# Patient Record
Sex: Female | Born: 1990 | Race: Black or African American | Hispanic: No | Marital: Single | Smoking: Never smoker
Health system: Southern US, Community
[De-identification: ages and names within clinical notes are randomized; demographics above are authoritative.]

## PROBLEM LIST (undated history)

## (undated) DIAGNOSIS — H469 Unspecified optic neuritis: Secondary | ICD-10-CM

## (undated) DIAGNOSIS — G35 Multiple sclerosis: Secondary | ICD-10-CM

## (undated) DIAGNOSIS — R269 Unspecified abnormalities of gait and mobility: Secondary | ICD-10-CM

## (undated) DIAGNOSIS — G43909 Migraine, unspecified, not intractable, without status migrainosus: Secondary | ICD-10-CM

## (undated) HISTORY — PX: APPENDECTOMY: SHX54

## (undated) HISTORY — DX: Unspecified optic neuritis: H46.9

## (undated) HISTORY — DX: Multiple sclerosis: G35

## (undated) HISTORY — DX: Migraine, unspecified, not intractable, without status migrainosus: G43.909

## (undated) HISTORY — DX: Unspecified abnormalities of gait and mobility: R26.9

---
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
## 2009-12-08 ENCOUNTER — Emergency Department (HOSPITAL_COMMUNITY): Admission: EM | Admit: 2009-12-08 | Discharge: 2009-12-08 | Payer: Self-pay | Admitting: Emergency Medicine

## 2009-12-08 IMAGING — CT CT HEAD W/O CM
1 series · 16 of 30 positions shown, 20 images · non-contrast
Comparison: None.

CLINICAL DATA: Dizziness and nausea for 2 days.  Right facial
asymmetry.

CT HEAD WITHOUT CONTRAST
TECHNIQUE: Contiguous axial images were obtained from the base of
the skull through the vertex without contrast.

[Series 2: brain · axial · 0.49mm/px · z∈[+128,+258]mm · 16 of 34 slices shown, 20 images]
[im 2/34  brain]
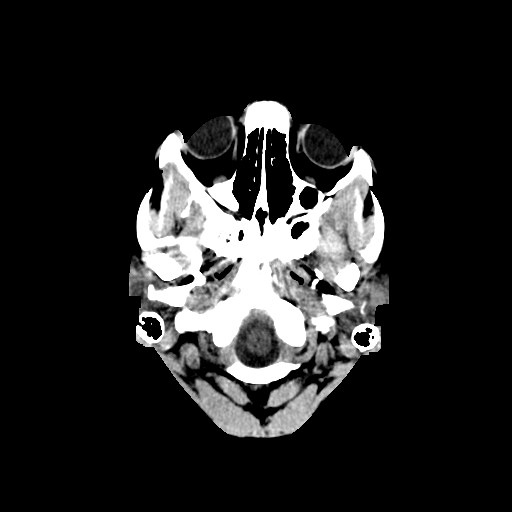
[im 2/34  bone]
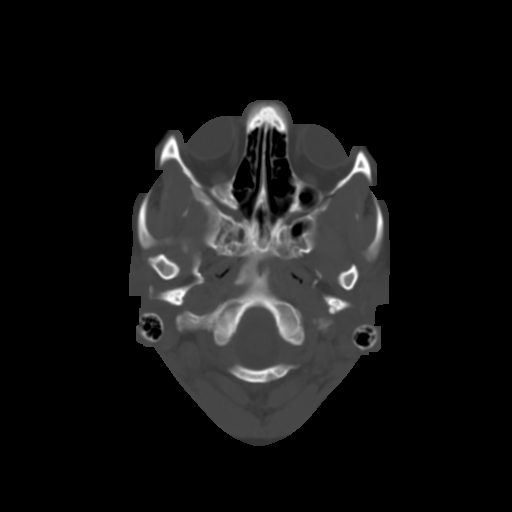
[im 4/34  brain]
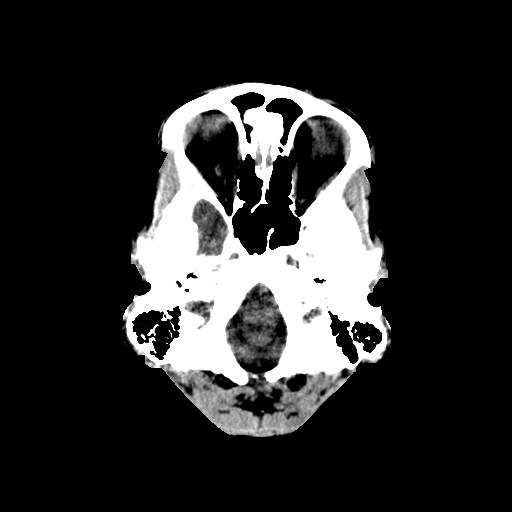
[im 6/34  brain]
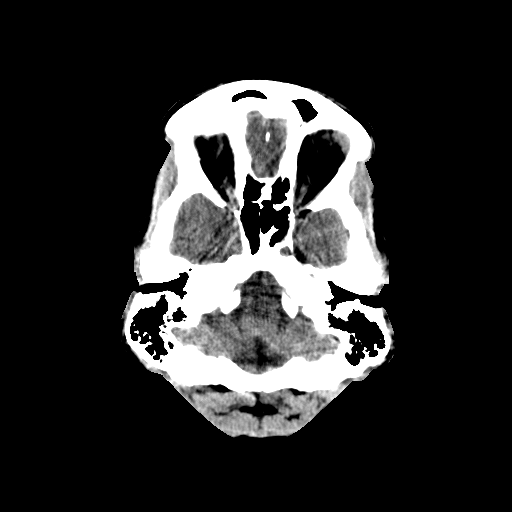
[im 8/34  brain]
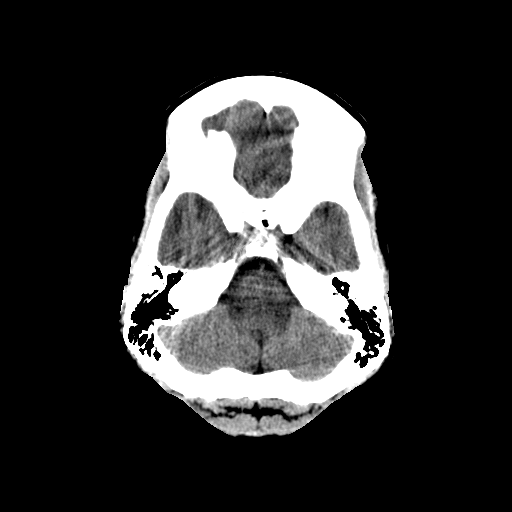
[im 10/34  brain]
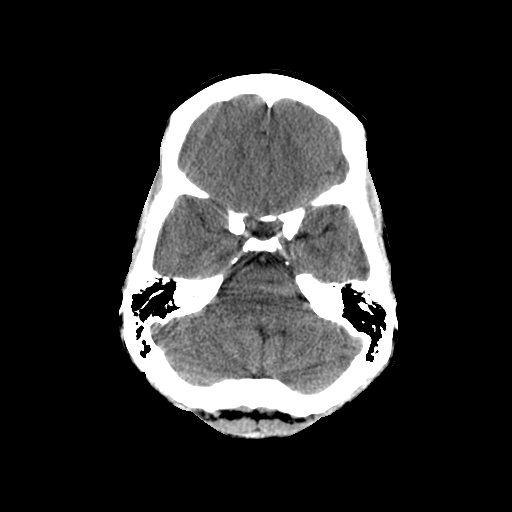
[im 10/34  bone]
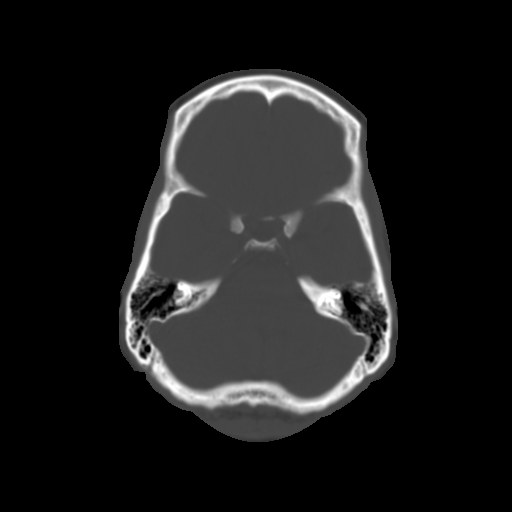
[im 12/34  brain]
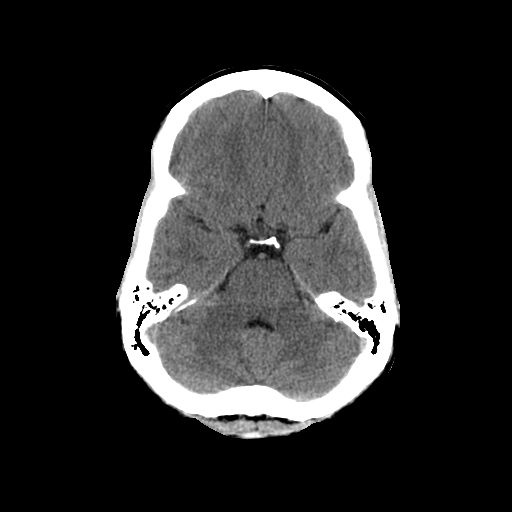
[im 14/34  brain]
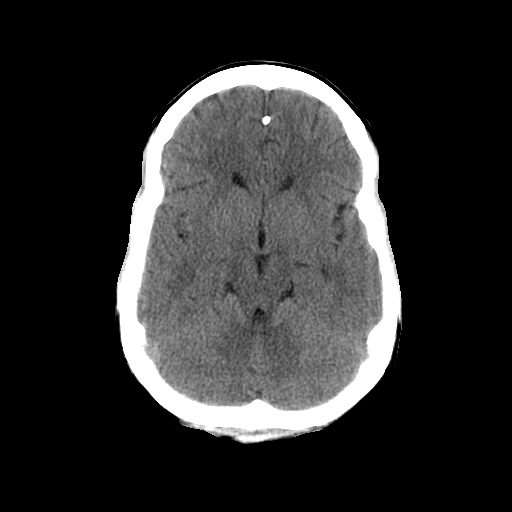
[im 16/34  brain]
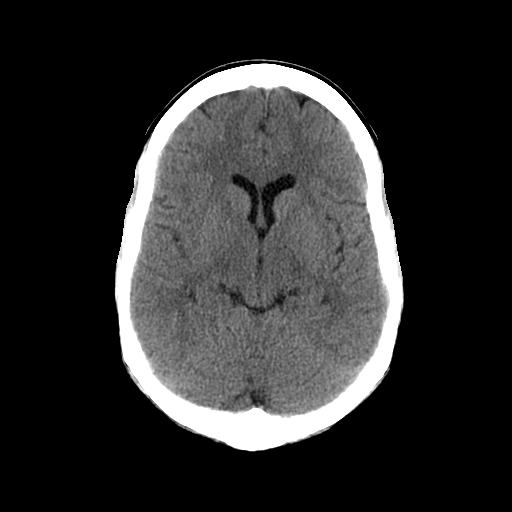
[im 18/34  brain]
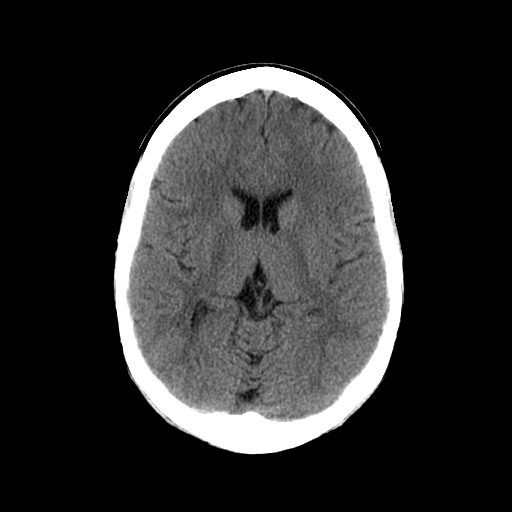
[im 18/34  bone]
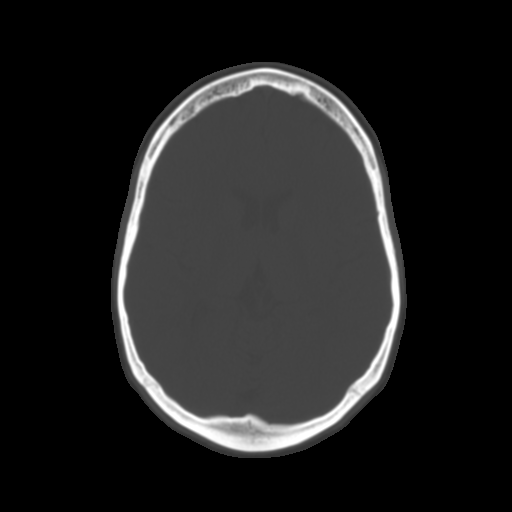
[im 20/34  brain]
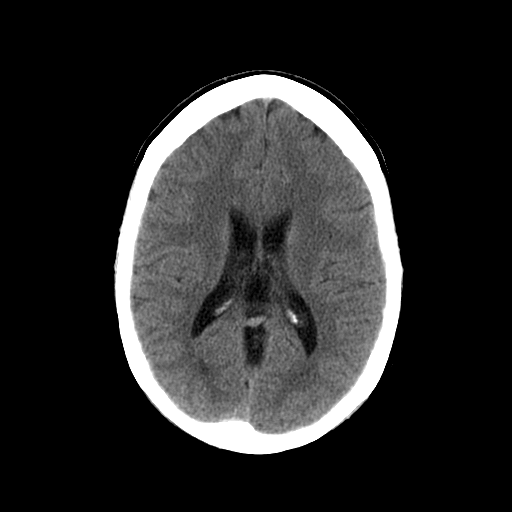
[im 22/34  brain]
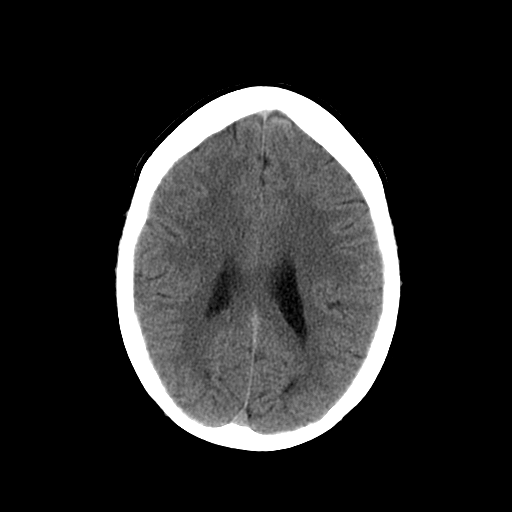
[im 24/34  brain]
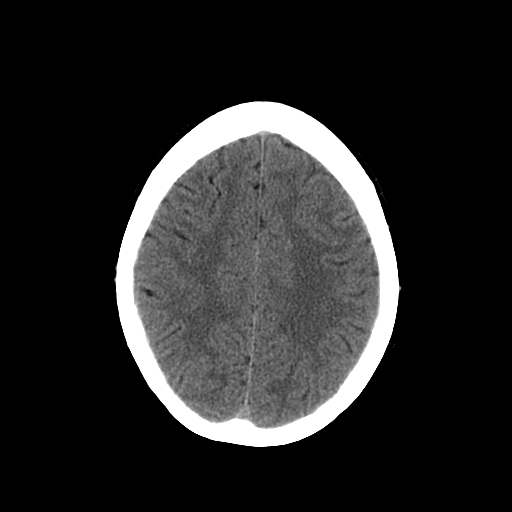
[im 26/34  brain]
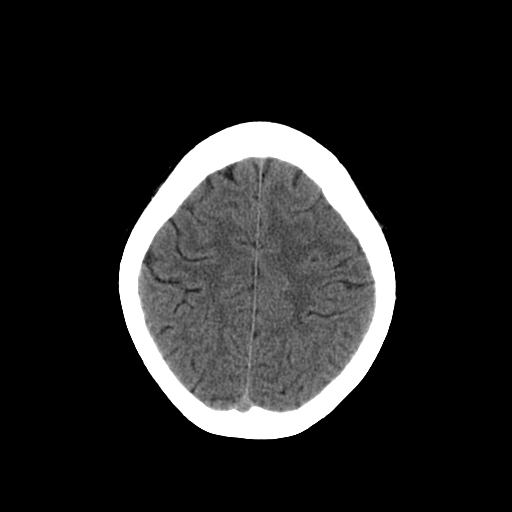
[im 26/34  bone]
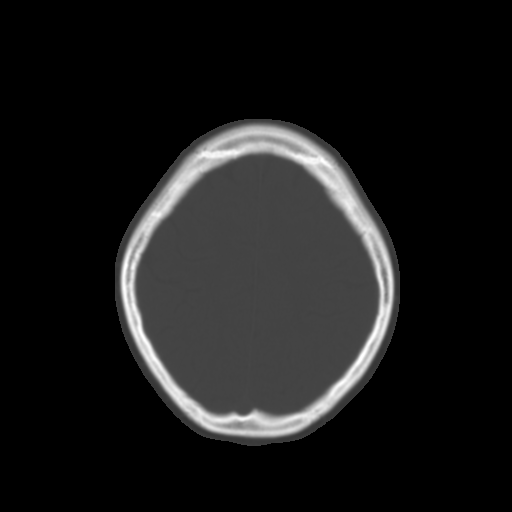
[im 28/34  brain]
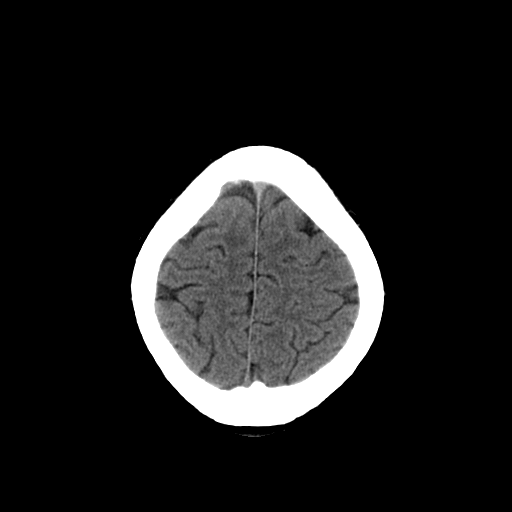
[im 30/34  brain]
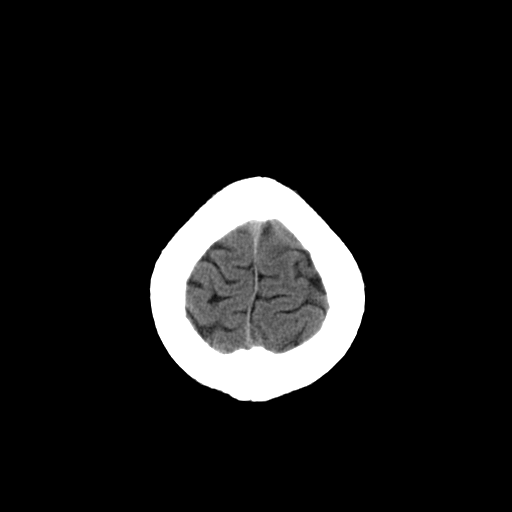
[im 32/34  brain]
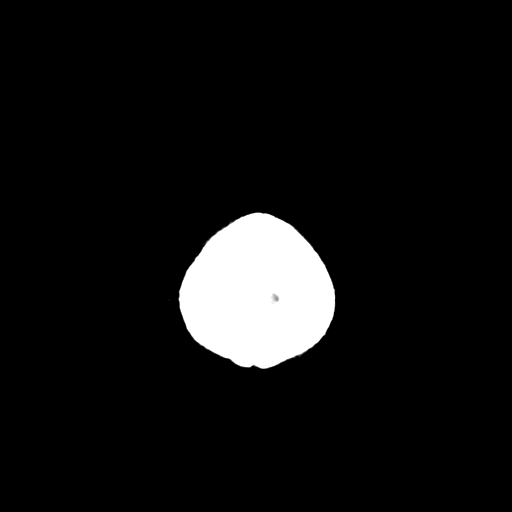

[16 of 30 positions shown; findings below may reference images not displayed]

FINDINGS: There is no evidence for acute infarction, intracranial
hemorrhage, mass lesion, hydrocephalus, or extra-axial fluid. There
is no atrophy or white matter disease.  Calvarium is intact.
Sinuses and mastoids are clear.
IMPRESSION: Negative

## 2012-11-23 ENCOUNTER — Other Ambulatory Visit: Payer: Self-pay | Admitting: *Deleted

## 2012-11-23 DIAGNOSIS — H4612 Retrobulbar neuritis, left eye: Secondary | ICD-10-CM

## 2012-11-24 ENCOUNTER — Ambulatory Visit (INDEPENDENT_AMBULATORY_CARE_PROVIDER_SITE_OTHER): Payer: Managed Care, Other (non HMO) | Admitting: Neurology

## 2012-11-24 ENCOUNTER — Encounter: Payer: Self-pay | Admitting: Neurology

## 2012-11-24 ENCOUNTER — Ambulatory Visit (INDEPENDENT_AMBULATORY_CARE_PROVIDER_SITE_OTHER): Payer: Managed Care, Other (non HMO) | Admitting: *Deleted

## 2012-11-24 VITALS — BP 114/68 | HR 91 | Ht 63.0 in | Wt 136.0 lb

## 2012-11-24 DIAGNOSIS — H469 Unspecified optic neuritis: Secondary | ICD-10-CM

## 2012-11-24 DIAGNOSIS — G35 Multiple sclerosis: Secondary | ICD-10-CM | POA: Insufficient documentation

## 2012-11-24 HISTORY — DX: Multiple sclerosis: G35

## 2012-11-24 HISTORY — DX: Unspecified optic neuritis: H46.9

## 2012-11-24 MED ORDER — SODIUM CHLORIDE 0.9 % IV SOLN: 500.0000 mg | INTRAVENOUS | Status: DC

## 2012-11-24 MED ORDER — METHYLPREDNISOLONE SODIUM SUCC 125 MG IJ SOLR: 500.0000 mg | Freq: Once | INTRAMUSCULAR | Status: DC

## 2012-11-24 NOTE — Patient Instructions (Signed)
Pt to return tomorrow at 0900.

## 2012-11-24 NOTE — Progress Notes (Signed)
Reason for visit: Optic neuritis  Toni Weaver is a 22 y.o. female  History of present illness:  Toni Weaver is a 22 year old right-handed black female with a history of a visual disturbance affecting the left eye approximately 10 days ago. The patient went for a routine eye examination 2 weeks ago, and 3 days after this examination, she began to have some discomfort around the left eye associated with some blurring of vision. The patient has noted that the visual blurring is in the center the vision, not the periphery. The pain in the eye has improved, but visual acuity has gradually worsened over time. The patient has not noted any other symptoms of numbness or weakness of the face, arms, or legs. The patient denies any balance issues or problems controlling the bowels or the bladder. The patient reports no significant fatigue. The patient has undergone MRI evaluation the brain that I have reviewed. It shows multiple white matter lesions with characteristic rounded lesions in the periventricular area consistent with demyelinating disease such as multiple sclerosis.  Past Medical History  Diagnosis Date  . Multiple sclerosis 11/24/2012  . Optic neuritis 11/24/2012    Past Surgical History  Procedure Laterality Date  . Appendectomy      Family History  Problem Relation Age of Onset  . Hypertension Mother   . Hypertension Father   . Cancer Maternal Grandmother     Breast cancer    Social history:  reports that she has never smoked. She does not have any smokeless tobacco history on file. She reports that she does not drink alcohol or use illicit drugs.  Medications:  No current outpatient prescriptions on file prior to visit.   No current facility-administered medications on file prior to visit.    Allergies: No Known Allergies  ROS:  Out of a complete 14 system review of symptoms, the patient complains only of the following symptoms, and all other reviewed systems are  negative.  Visual blurring  Blood pressure 114/68, pulse 91, height 5\' 3"  (1.6 m), weight 136 lb (61.689 kg).  Physical Exam  General: The patient is alert and cooperative at the time of the examination.  Head: Pupils are equal, round, and reactive to light. Discs are flat bilaterally.  Neck: The neck is supple, no carotid bruits are noted.  Respiratory: The respiratory examination is clear.  Cardiovascular: The cardiovascular examination reveals a regular rate and rhythm, no obvious murmurs or rubs are noted.  Skin: Extremities are without significant edema.  Neurologic Exam  Mental status:  Cranial nerves: Facial symmetry is present. There is good sensation of the face to pinprick and soft touch bilaterally. The strength of the facial muscles and the muscles to head turning and shoulder shrug are normal bilaterally. Speech is well enunciated, no aphasia or dysarthria is noted. Extraocular movements are full. Visual fields are full.  Motor: The motor testing reveals 5 over 5 strength of all 4 extremities. Good symmetric motor tone is noted throughout.  Sensory: Sensory testing is intact to pinprick, soft touch, vibration sensation, and position sense on all 4 extremities, with the exception that position sense is depressed slightly in the right foot. No evidence of extinction is noted.  Coordination: Cerebellar testing reveals good finger-nose-finger and heel-to-shin bilaterally.  Gait and station: Gait is normal. Tandem gait is normal. Romberg is negative. No drift is seen.  Reflexes: Deep tendon reflexes are symmetric and normal bilaterally. Toes are downgoing bilaterally.   Assessment/Plan:  1. Optic neuritis,  OS  2. Abnormal MRI, consistent with multiple sclerosis  The patient has multiple brain lesions and evidence of a left optic neuritis. The patient already meets the McDonald criteria for the diagnosis of multiple sclerosis. The patient will go on to have further  blood work at this time, and she will be started on Solu-Medrol taking 500 mg IV daily for 4 days. The patient was given information regarding multiple sclerosis. The patient will be set up for a visual evoked response test, and she will return in 2 weeks to discuss management of her multiple sclerosis. Lumbar puncture in this case is probably not required.  Marlan Palau MD 11/24/2012 3:36 PM  [facility] [address] [place], [place] [address]  Phone [phone] Fax [phone]

## 2012-11-25 ENCOUNTER — Other Ambulatory Visit: Payer: Self-pay | Admitting: Neurology

## 2012-11-25 ENCOUNTER — Ambulatory Visit (INDEPENDENT_AMBULATORY_CARE_PROVIDER_SITE_OTHER): Payer: Managed Care, Other (non HMO) | Admitting: *Deleted

## 2012-11-25 VITALS — BP 108/72 | HR 78 | Temp 98.6°F

## 2012-11-25 DIAGNOSIS — G35 Multiple sclerosis: Secondary | ICD-10-CM

## 2012-11-25 MED ORDER — SODIUM CHLORIDE 0.9 % IV SOLN
500.0000 mg | INTRAVENOUS | Status: AC
  Administered 2012-11-25: 500 mg via INTRAVENOUS

## 2012-11-25 MED ORDER — METHYLPREDNISOLONE SODIUM SUCC 500 MG IJ SOLR: 500.0000 mg | Freq: Once | INTRAMUSCULAR | Status: DC

## 2012-11-25 NOTE — Patient Instructions (Addendum)
Patient to return tomorrow.

## 2012-11-26 ENCOUNTER — Ambulatory Visit: Payer: Self-pay | Admitting: Neurology

## 2012-11-26 ENCOUNTER — Encounter (INDEPENDENT_AMBULATORY_CARE_PROVIDER_SITE_OTHER): Payer: Managed Care, Other (non HMO) | Admitting: *Deleted

## 2012-11-26 ENCOUNTER — Ambulatory Visit: Payer: Self-pay | Admitting: *Deleted

## 2012-11-26 ENCOUNTER — Other Ambulatory Visit (INDEPENDENT_AMBULATORY_CARE_PROVIDER_SITE_OTHER): Payer: Managed Care, Other (non HMO) | Admitting: Neurology

## 2012-11-26 ENCOUNTER — Other Ambulatory Visit: Payer: Self-pay | Admitting: Neurology

## 2012-11-26 DIAGNOSIS — Z0289 Encounter for other administrative examinations: Secondary | ICD-10-CM

## 2012-11-26 DIAGNOSIS — G35 Multiple sclerosis: Secondary | ICD-10-CM

## 2012-11-26 MED ORDER — SODIUM CHLORIDE 0.9 % IV SOLN
500.0000 mg | INTRAVENOUS | Status: DC
  Administered 2012-11-26: 500 mg via INTRAVENOUS

## 2012-11-26 MED ORDER — SODIUM CHLORIDE 0.9 % IV SOLN: 500.0000 mg | INTRAVENOUS | Status: DC

## 2012-11-26 MED ORDER — SODIUM CHLORIDE 0.9 % IV SOLN: 500.0000 mg | Freq: Once | INTRAVENOUS | Status: DC

## 2012-11-26 NOTE — Progress Notes (Unsigned)
This encounter was created in error - please disregard.

## 2012-11-26 NOTE — Progress Notes (Signed)
This encounter was created in error - please disregard.

## 2012-11-26 NOTE — Addendum Note (Signed)
Addended by: Guy Begin on: 11/26/2012 10:19 AM   Modules accepted: Level of Service, SmartSet

## 2012-11-28 LAB — HIV ANTIBODY (ROUTINE TESTING W REFLEX)
HIV 1/O/2 Abs-Index Value: <1 (ref <1.00)
HIV-1/HIV-2 Ab: NONREACTIVE

## 2012-11-28 LAB — LUPUS ANTICOAGULANT
Dilute Viper Venom Time: 36.9 s (ref 0.0–55.1)
PTT Lupus Anticoagulant: 43.9 s (ref 0.0–50.0)
dPT: 41.5 s (ref 0.0–55.0)

## 2012-11-28 LAB — RPR: RPR: NONREACTIVE

## 2012-11-28 LAB — CARDIOLIPIN ANTIBODIES, IGM+IGG: Anticardiolipin IgM: <9 MPL U/mL (ref 0–12)

## 2012-11-29 ENCOUNTER — Telehealth: Payer: Self-pay | Admitting: Neurology

## 2012-11-29 NOTE — Telephone Encounter (Signed)
I called patient. The blood work was unremarkable with exception that the ANA was positive. We may need to get a panel looking at the antibody levels for an, and the patient is considering Gilenya, and we will need to be herpes zoster antibody level. The patient will followup in the next 2 weeks. The visual evoked response test is pending. The MRI scan of the cervical spine is pending.

## 2012-11-30 ENCOUNTER — Telehealth: Payer: Self-pay | Admitting: Neurology

## 2012-11-30 ENCOUNTER — Other Ambulatory Visit: Payer: Self-pay | Admitting: Neurology

## 2012-11-30 ENCOUNTER — Ambulatory Visit (INDEPENDENT_AMBULATORY_CARE_PROVIDER_SITE_OTHER): Payer: Managed Care, Other (non HMO)

## 2012-11-30 DIAGNOSIS — H469 Unspecified optic neuritis: Secondary | ICD-10-CM

## 2012-11-30 DIAGNOSIS — G35 Multiple sclerosis: Secondary | ICD-10-CM

## 2012-11-30 NOTE — Telephone Encounter (Signed)
I called patient. The visual evoked response test was done, confirming presence of a left optic neuritis. The right side was unremarkable.

## 2012-11-30 NOTE — Procedures (Signed)
  VER Study  [place] Toni Weaver is a 22 year old patient with a history of left eye visual disturbance that began around 11/14/2012. The patient has had some discomfort around the eye with eye movement. MRI evaluation the brain has shown multiple white matter lesions consistent with the diagnosis of multiple sclerosis. The patient is being evaluated for the left sided optic neuritis.  Description: Visual evoked response test was performed bilaterally using 32 x 32 check sizes. On the left, the N1 and the P100 waveform latencies were prolonged, these were normal on the right side. The amplitude for the P100 wave forms were within normal limits bilaterally, but the waveform was of lower amplitude on the left.  Impression: This is an abnormal visual evoked response test secondary to prolongation of the P100 waveform latency on the left. This study is consistent with a demyelinating lesion within the anterior visual pathways, and is consistent with a left optic neuritis. The study is normal on the right.

## 2012-12-01 ENCOUNTER — Telehealth: Payer: Self-pay

## 2012-12-01 LAB — NMO IGG AUTOANTIBODIES: NMO-IgG: <1.6 U/mL (ref 0.0–3.0)

## 2012-12-01 NOTE — Telephone Encounter (Signed)
Message copied by Doree Barthel on Wed Dec 01, 2012  8:46 AM ------      Message from: Arther Abbott B      Created: Mon Nov 29, 2012 11:48 AM      Contact: Nurse w/Dr. Corky Mull office       Referral Dr. Corky Mull w/[facility]  needs notes from DOS w/Dr. Anne Hahn on 11/24/12. Needs them fax to [phone]. Thanks  ------

## 2012-12-01 NOTE — Telephone Encounter (Signed)
Sent fax

## 2012-12-02 ENCOUNTER — Other Ambulatory Visit: Payer: Self-pay | Admitting: Diagnostic Neuroimaging

## 2012-12-02 ENCOUNTER — Telehealth: Payer: Self-pay | Admitting: Neurology

## 2012-12-02 DIAGNOSIS — G35 Multiple sclerosis: Secondary | ICD-10-CM

## 2012-12-02 DIAGNOSIS — H469 Unspecified optic neuritis: Secondary | ICD-10-CM

## 2012-12-02 NOTE — Telephone Encounter (Signed)
I called the patient. The MRI of the cervical spine does show findings consistent with multiple sclerosis. The patient should be seen back soon, and we will probably get her started on Gilenya.

## 2012-12-06 ENCOUNTER — Telehealth: Payer: Self-pay | Admitting: *Deleted

## 2012-12-06 ENCOUNTER — Other Ambulatory Visit: Payer: Self-pay | Admitting: Neurology

## 2012-12-06 DIAGNOSIS — G35 Multiple sclerosis: Secondary | ICD-10-CM

## 2012-12-06 NOTE — Telephone Encounter (Signed)
I called and left a message for the patient's mother to callback to the office to speak with me Jasmine December) concerning an appt,

## 2012-12-06 NOTE — Telephone Encounter (Signed)
Message copied by Janey Greaser on Mon Dec 06, 2012  9:35 AM ------      Message from: [facility], MONICA L      Created: Thu Dec 02, 2012 10:19 AM      Contact: Margaret(mother)       Pt's mom spoke with Dr. Anne Hahn on Monday and came in for lab work and eye exam, and pt is ready to get an appt for treatment for Gilenya.  Please get the paperwork for assistance cost together so she can pick it up to have it filled out. Mother states Per Dr. Stormy Card will need to work the pt in ASAP. [phone] Claris Che (mother) this is her call back number and you can leave her a detailed message if she does not answer. ------

## 2012-12-08 ENCOUNTER — Other Ambulatory Visit: Payer: Self-pay | Admitting: Neurology

## 2012-12-08 DIAGNOSIS — Z5181 Encounter for therapeutic drug level monitoring: Secondary | ICD-10-CM

## 2012-12-09 ENCOUNTER — Telehealth: Payer: Self-pay | Admitting: Neurology

## 2012-12-09 LAB — CBC WITH DIFFERENTIAL
Basos: 1 % (ref 0–3)
Eosinophils Absolute: 0 10*3/uL (ref 0.0–0.4)
Hemoglobin: 11.9 g/dL (ref 11.1–15.9)
Immature Grans (Abs): 0 10*3/uL (ref 0.0–0.1)
Lymphs: 25 % (ref 14–46)
MCHC: 33.7 g/dL (ref 31.5–35.7)
Monocytes: 9 % (ref 4–12)
Neutrophils Relative %: 64 % (ref 40–74)
Platelets: 380 10*3/uL — ABNORMAL HIGH (ref 155–379)
RDW: 14.8 % (ref 12.3–15.4)
WBC: 7.9 10*3/uL (ref 3.4–10.8)

## 2012-12-09 LAB — COMPREHENSIVE METABOLIC PANEL
Alkaline Phosphatase: 60 IU/L (ref 39–117)
CO2: 28 mmol/L (ref 19–28)
Chloride: 102 mmol/L (ref 97–108)
GFR calc non Af Amer: 125 mL/min/{1.73_m2} (ref >59)
Globulin, Total: 2.3 g/dL (ref 1.5–4.5)
Glucose: 70 mg/dL (ref 65–99)
Potassium: 4.4 mmol/L (ref 3.5–5.2)
Total Protein: 6.8 g/dL (ref 6.0–8.5)

## 2012-12-09 LAB — VARICELLA ZOSTER ANTIBODY, IGG: Varicella zoster IgG: 1909 index (ref >165)

## 2012-12-09 NOTE — Progress Notes (Signed)
I called and also spoke to mother of pt.   Pt alittle overwhelmed with all the information.   Ok to go thru mother, to relay information.   Mother is on the contact emergency list.  I told mother that the [place] p/w faxed and insurance verification for the gilenya in process thru go start program.  She has appt with cardiology on 12-31-12, eye and labs, including chicken pox done.  Once all the tests/consults/ medication auth done will proceed with FDO at Dr. Verl Dicker office.  Mother verbalized understanding.

## 2012-12-09 NOTE — Progress Notes (Signed)
Quick Note:  I called and spoke with the patient concerning her lab results being normal. ______ 

## 2012-12-24 NOTE — Telephone Encounter (Signed)
done

## 2013-01-07 ENCOUNTER — Telehealth: Payer: Self-pay

## 2013-01-07 NOTE — Telephone Encounter (Signed)
[employer] sent [place] a fax denying the request for coverage on Gilenya.  They state the patient has to have a documented trial and failure of Avonex or Rebif.  Please advise. Thank you.

## 2013-01-07 NOTE — Telephone Encounter (Signed)
I called the patient. Her insurance will not allow Gilenya for her MS. We will need to start with Avonex or Rebif.She will need to come in to sign a form to get started.

## 2013-01-10 ENCOUNTER — Telehealth: Payer: Self-pay | Admitting: Neurology

## 2013-01-10 NOTE — Telephone Encounter (Signed)
Enrollement form has been completed.  The patient will need to sign, as well as the provider.  I called the patient's mother back.  Got no answer.  Left message.

## 2013-02-24 ENCOUNTER — Telehealth: Payer: Self-pay | Admitting: Neurology

## 2013-02-24 DIAGNOSIS — R3911 Hesitancy of micturition: Secondary | ICD-10-CM

## 2013-02-24 NOTE — Telephone Encounter (Signed)
I called patient. The patient has had some difficulty voiding the bladder over the last 7-10 days. The patient will come in to get a urinalysis to ensure that she does not have a urinary tract infection. The patient is on Gilenya, and she is concerned that this may be a medication side effect. We'll need to rule out a urinary tract infection first.

## 2013-02-24 NOTE — Telephone Encounter (Signed)
I called patient. I left messages. I will call back tomorrow.

## 2013-02-24 NOTE — Telephone Encounter (Signed)
Patient called stating she is concern about the side effect of the medication.  I tried calling patient to verify medication but I had to leave a message.Reading phone notes I think it Rebif.

## 2013-02-28 ENCOUNTER — Other Ambulatory Visit: Payer: Self-pay | Admitting: *Deleted

## 2013-02-28 ENCOUNTER — Other Ambulatory Visit: Payer: Self-pay | Admitting: Neurology

## 2013-02-28 DIAGNOSIS — R3911 Hesitancy of micturition: Secondary | ICD-10-CM

## 2013-03-01 ENCOUNTER — Telehealth: Payer: Self-pay | Admitting: Neurology

## 2013-03-01 LAB — URINALYSIS, ROUTINE W REFLEX MICROSCOPIC
Bilirubin, UA: NEGATIVE
Glucose, UA: NEGATIVE
Ketones, UA: NEGATIVE
Leukocytes, UA: NEGATIVE
Nitrite, UA: NEGATIVE
RBC, UA: NEGATIVE
Specific Gravity, UA: <=1.005 — AB (ref 1.005–1.030)
Urobilinogen, Ur: 0.2 mg/dL (ref 0.0–1.9)
pH, UA: 7 (ref 5.0–7.5)

## 2013-03-01 NOTE — Telephone Encounter (Signed)
I called patient. The urinalysis was unremarkable. The patient indicates that her bladder issues are intermittent. We will watch for now. The patient is not on Gilenya, as her insurance company would not cover this. The patient is on Rebif.

## 2013-03-08 ENCOUNTER — Other Ambulatory Visit: Payer: Self-pay | Admitting: *Deleted

## 2013-03-08 DIAGNOSIS — Z5181 Encounter for therapeutic drug level monitoring: Secondary | ICD-10-CM

## 2013-04-11 ENCOUNTER — Telehealth: Payer: Self-pay | Admitting: Neurology

## 2013-04-11 NOTE — Telephone Encounter (Signed)
Called patient to get a little more details on the which injection and what type of headache she is having. Told her to call office back once she got the message.

## 2013-04-12 NOTE — Telephone Encounter (Signed)
Tried to reach patient again. No answer.

## 2013-04-13 ENCOUNTER — Telehealth: Payer: Self-pay | Admitting: *Deleted

## 2013-04-13 MED ORDER — NORTRIPTYLINE HCL 10 MG PO CAPS: ORAL_CAPSULE | ORAL | Status: DC

## 2013-04-13 NOTE — Telephone Encounter (Signed)
I called patient. She is having daily headaches over the last several months after she went to the full dose of the Rebif. I will try nortriptyline, but if this does not help, we will need to consider getting the patient off of Rebif and going to Copaxone.

## 2013-04-14 ENCOUNTER — Telehealth: Payer: Self-pay | Admitting: Nurse Practitioner

## 2013-04-14 NOTE — Telephone Encounter (Signed)
Call pt no answer, left message. °

## 2013-04-15 ENCOUNTER — Ambulatory Visit (INDEPENDENT_AMBULATORY_CARE_PROVIDER_SITE_OTHER): Payer: Managed Care, Other (non HMO) | Admitting: Nurse Practitioner

## 2013-04-15 ENCOUNTER — Encounter: Payer: Self-pay | Admitting: Nurse Practitioner

## 2013-04-15 VITALS — BP 118/76 | HR 76 | Temp 97.8°F | Ht 63.5 in | Wt 142.0 lb

## 2013-04-15 DIAGNOSIS — G35 Multiple sclerosis: Secondary | ICD-10-CM

## 2013-04-15 DIAGNOSIS — R51 Headache: Secondary | ICD-10-CM

## 2013-04-15 NOTE — Progress Notes (Addendum)
[facility]  PATIENT: Toni Weaver DOB: 10-31-1990   REASON FOR VISIT: follow up HISTORY FROM: patient  HISTORY OF PRESENT ILLNESS: 11/24/12 Toni Weaver):  Ms. Pinho is a 22 year old right-handed black female with a history of a visual disturbance affecting the left eye approximately 10 days ago. The patient went for a routine eye examination 2 weeks ago, and 3 days after this examination, she began to have some discomfort around the left eye associated with some blurring of vision. The patient has noted that the visual blurring is in the center the vision, not the periphery. The pain in the eye has improved, but visual acuity has gradually worsened over time. The patient has not noted any other symptoms of numbness or weakness of the face, arms, or legs. The patient denies any balance issues or problems controlling the bowels or the bladder. The patient reports no significant fatigue. The patient has undergone MRI evaluation the brain that I have reviewed. It shows multiple white matter lesions with characteristic rounded lesions in the periventricular area consistent with demyelinating disease such as multiple sclerosis.  04/13/13 (KW): Phone note:  She is having daily headaches over the last several months after she went to the full dose of the Rebif. I will try nortriptyline, but if this does not help, we will need to consider getting the patient off of Rebif and going to Copaxone.  UPDATE 04/15/13 (LL):  Patient comes in for follow up and labs.  She is still having persistent daily headaches, worse the day after injections.  She did not fill the Nortriptyline Rx because she states she is not depressed and doesn't like to take medications if not needed.  She takes Tylenol prn which helps, she states.  Denies any problems with injections or injection site reactions.  She was seen by [facility] RN, Raynelle Fanning, Wednesday.  No new neurologic symptoms.  REVIEW OF SYSTEMS: Full 14  system review of systems performed and notable only for:  Constitutional: N/A  Cardiovascular: N/A  Ear/Nose/Throat: N/A  Skin: N/A  Eyes: N/A  Respiratory: N/A  Gastroitestinal: N/A  Genitourinary: N/A Hematology/Lymphatic: N/A  Endocrine: N/A Musculoskeletal:N/A  Allergy/Immunology: N/A  Neurological: headache Psychiatric: N/A Sleep: N/A   ALLERGIES: No Known Allergies  HOME MEDICATIONS: Outpatient Prescriptions Prior to Visit  Medication Sig Dispense Refill  . interferon beta-1a (REBIF) 44 MCG/0.5ML injection Inject 44 mcg into the skin 3 (three) times a week.      . Multiple Vitamin (MULTIVITAMIN) tablet Take 1 tablet by mouth daily.        PAST MEDICAL HISTORY: Past Medical History  Diagnosis Date  . Multiple sclerosis 11/24/2012  . Optic neuritis 11/24/2012    PAST SURGICAL HISTORY: Past Surgical History  Procedure Laterality Date  . Appendectomy      FAMILY HISTORY: Family History  Problem Relation Age of Onset  . Hypertension Mother   . Hypertension Father   . Cancer Maternal Grandmother     Breast cancer    SOCIAL HISTORY: History   Social History  . Marital Status: Single    Spouse Name: N/A    Number of Children: 0  . Years of Education: In college   Occupational History  .  Other    Pt. works at [employer]   Social History Main Topics  . Smoking status: Never Smoker   . Smokeless tobacco: Not on file  . Alcohol Use: No  . Drug Use: No  . Sexual Activity: Not on  file   Other Topics Concern  . Not on file   Social History Narrative  . No narrative on file   PHYSICAL EXAM  Filed Vitals:   04/15/13 0908  BP: 118/76  Pulse: 76  Temp: 97.8 F (36.6 C)  TempSrc: Oral  Height: 5' 3.5" (1.613 m)  Weight: 142 lb (64.411 kg)   Body mass index is 24.76 kg/(m^2).  Physical Exam  Generalized: Well developed young AA female, in no acute distress  Head: normocephalic and atraumatic. Oropharynx benign  Neck: Supple,  no carotid bruits  Cardiac: Regular rate rhythm, no murmur  Respiratory: The respiratory examination is clear.  Musculoskeletal: No deformity  Skin: Extremities are without significant edema.   Neurologic Exam  Mental status:  Cranial nerves: Facial symmetry is present. There is good sensation of the face to pinprick and soft touch bilaterally. The strength of the facial muscles and the muscles to head turning and shoulder shrug are normal bilaterally. Speech is well enunciated, no aphasia or dysarthria is noted. Extraocular movements are full. Visual fields are full.  Motor: The motor testing reveals 5 over 5 strength of all 4 extremities. Good symmetric motor tone is noted throughout.  Sensory: Sensory testing is intact to pinprick, soft touch, vibration sensation, and position sense on all 4 extremities, with the exception that position sense is depressed slightly in the right foot. No evidence of extinction is noted.  Coordination: Cerebellar testing reveals good finger-nose-finger and heel-to-shin bilaterally.  Gait and station: Gait is normal. Tandem gait is normal. Romberg is negative. No drift is seen.  Reflexes: Deep tendon reflexes are symmetric and normal bilaterally. Toes are downgoing bilaterally.   DIAGNOSTIC DATA (LABS, IMAGING, TESTING) - I reviewed patient records, labs, notes, testing and imaging myself where available.  Lab Results  Component Value Date   WBC 7.9 12/08/2012   HGB 11.9 12/08/2012   HCT 35.3 12/08/2012   MCV 81 12/08/2012   PLT 380* 12/08/2012      Component Value Date/Time   NA 141 12/08/2012 1151   K 4.4 12/08/2012 1151   CL 102 12/08/2012 1151   CO2 28 12/08/2012 1151   GLUCOSE 70 12/08/2012 1151   BUN 9 12/08/2012 1151   CREATININE 0.68 12/08/2012 1151   CALCIUM 9.8 12/08/2012 1151   PROT 6.8 12/08/2012 1151   AST 15 12/08/2012 1151   ALT 8 12/08/2012 1151   ALKPHOS 60 12/08/2012 1151   BILITOT 0.4 12/08/2012 1151   GFRNONAA 125 12/08/2012 1151   GFRAA 144 12/08/2012 1151    No results found for this basename: CHOL,  HDL,  LDLCALC,  LDLDIRECT,  TRIG,  CHOLHDL   No results found for this basename: HGBA1C   No results found for this basename: VITAMINB12   No results found for this basename: TSH    VER Study 11/30/12 This is an abnormal visual evoked response test secondary to prolongation of the P100 waveform latency on the left. This study is consistent with a demyelinating lesion within the anterior visual pathways, and is consistent with a left optic neuritis. The study is normal on the right.   ASSESSMENT AND PLAN 22 y.o. year old female  has a past medical history of Multiple sclerosis (11/24/2012) and Optic neuritis (11/24/2012) here for follow up of MS.  She started on Rebif injections in July.  She has been doing well with injections, but having persistent headaches.  Nortriptyline was ordered but patient chose not to have medication filled because it was  an antidepressant.  Even though it was explained that Nortriptyline was used for many other diagnoses, including Migraine and tension headaches, she did not want to try it.  She would rather use Tylenol prn.  PLAN: 1. Continue Rebif. 2. Check labs today: cbc with diff, LFTs and TSH. 3. Follow up visit in 6 months, sooner as needed.   Orders Placed This Encounter  Procedures  . Hepatic function panel  . TSH  . CBC with Differential   Tawny Asal Lyden Redner, MSN, NP-C 04/15/2013, 10:01 AM [facility] [address] [place], [place] [address] [phone]  The patient was seen by myself today, and examined. The patient has been having headaches on the Rebif, but she does not wish to go on nortriptyline for the headache. The patient will continue Rebif for now. No new MS symptoms have been noted.  I have read the note, and I agree with the clinical assessment and plan.  Lesly Dukes

## 2013-04-15 NOTE — Patient Instructions (Addendum)
Lab work today, CBC, liver function and Thyroid function.  Follow up visit in 6 months, call sooner if you have any problems.

## 2013-04-15 NOTE — Telephone Encounter (Signed)
PER JULIE, PATIENT DUE FOR SOME LABS. APPT SCHEDULED FOR 04/15/13 WITH L.LAM

## 2013-04-16 LAB — CBC WITH DIFFERENTIAL/PLATELET
Basophils Absolute: 0 10*3/uL (ref 0.0–0.2)
Basos: 1 % (ref 0–3)
Eos: 1 % (ref 0–5)
Eosinophils Absolute: 0 10*3/uL (ref 0.0–0.4)
HCT: 35.5 % (ref 34.0–46.6)
Hemoglobin: 11.9 g/dL (ref 11.1–15.9)
Immature Grans (Abs): 0 10*3/uL (ref 0.0–0.1)
Immature Granulocytes: 0 % (ref 0–2)
Lymphocytes Absolute: 2.2 10*3/uL (ref 0.7–3.1)
Lymphs: 52 % — ABNORMAL HIGH (ref 14–46)
MCH: 26.4 pg — ABNORMAL LOW (ref 26.6–33.0)
MCHC: 33.5 g/dL (ref 31.5–35.7)
MCV: 79 fL (ref 79–97)
Monocytes Absolute: 0.4 10*3/uL (ref 0.1–0.9)
Monocytes: 9 % (ref 4–12)
Neutrophils Absolute: 1.6 10*3/uL (ref 1.4–7.0)
Neutrophils Relative %: 37 % — ABNORMAL LOW (ref 40–74)
RBC: 4.51 x10E6/uL (ref 3.77–5.28)
RDW: 14.1 % (ref 12.3–15.4)
WBC: 4.3 10*3/uL (ref 3.4–10.8)

## 2013-04-16 LAB — HEPATIC FUNCTION PANEL
ALT: 58 IU/L — ABNORMAL HIGH (ref 0–32)
AST: 36 IU/L (ref 0–40)
Albumin: 4.4 g/dL (ref 3.5–5.5)
Alkaline Phosphatase: 59 IU/L (ref 39–117)
Bilirubin, Direct: 0.08 mg/dL (ref 0.00–0.40)
Total Bilirubin: 0.3 mg/dL (ref 0.0–1.2)
Total Protein: 6.8 g/dL (ref 6.0–8.5)

## 2013-04-16 LAB — TSH: TSH: 1.71 u[IU]/mL (ref 0.450–4.500)

## 2013-04-26 ENCOUNTER — Other Ambulatory Visit: Payer: Self-pay | Admitting: Nurse Practitioner

## 2013-04-26 DIAGNOSIS — G35 Multiple sclerosis: Secondary | ICD-10-CM

## 2013-04-28 ENCOUNTER — Telehealth: Payer: Self-pay | Admitting: Neurology

## 2013-04-28 ENCOUNTER — Telehealth: Payer: Self-pay

## 2013-04-28 NOTE — Telephone Encounter (Signed)
Message copied by [facility] on Thu Apr 28, 2013  1:53 PM ------      Message from: LAM, Larita Fife E      Created: Tue Apr 26, 2013  1:52 PM       Liver enzymes are slightly elevated.  Please call patient or send letter and ask that she come back in 3 months to have repeat liver enzymes. ------

## 2013-04-28 NOTE — Telephone Encounter (Signed)
I called patient's cell phone and left VM that liver enzymes were slightly elevated. Not of concern but, we would like patient to come back in about 2.5 to 3 months to have that rechecked. Please call and schedule follow up lab appointment.

## 2013-04-29 NOTE — Telephone Encounter (Signed)
I called the mother. The SGPT was slightly elevated, we will follow this over time. I would not readjust the dosing of the medication at this time.

## 2013-04-29 NOTE — Telephone Encounter (Signed)
Dr. Anne Hahn the patient mother is concerned that her daughter liver enzymes are elevated. Her father has had a history of liver problems. She wants to know if the Rebif can be decreased until liver enzymes are retested. Please Call.

## 2013-06-23 ENCOUNTER — Telehealth: Payer: Self-pay | Admitting: Neurology

## 2013-06-23 NOTE — Telephone Encounter (Signed)
Called for more information, lt VM

## 2013-08-24 ENCOUNTER — Other Ambulatory Visit: Payer: Self-pay

## 2013-08-24 MED ORDER — INTERFERON BETA-1A 44 MCG/0.5ML ~~LOC~~ SOLN: 44.0000 ug | SUBCUTANEOUS | Status: DC

## 2013-10-25 ENCOUNTER — Telehealth: Payer: Self-pay | Admitting: *Deleted

## 2013-10-25 ENCOUNTER — Telehealth: Payer: Self-pay | Admitting: Neurology

## 2013-10-25 NOTE — Telephone Encounter (Signed)
Dr. Anne Hahn needs to change patient appt from 9 am 10/28/13. 1st available if she call back in time 8 am 3/25.

## 2013-10-25 NOTE — Telephone Encounter (Signed)
Pt called back to state she could make the appointment at 8:00 tomorrow.

## 2013-10-25 NOTE — Telephone Encounter (Signed)
Pt returned Sonya's call.  She stated that she could not make the 8:00 appointment on 3/25. Please call to discuss if there are any other options.  Thank you

## 2013-10-26 ENCOUNTER — Ambulatory Visit (INDEPENDENT_AMBULATORY_CARE_PROVIDER_SITE_OTHER): Payer: Managed Care, Other (non HMO) | Admitting: Neurology

## 2013-10-26 ENCOUNTER — Encounter: Payer: Self-pay | Admitting: Neurology

## 2013-10-26 ENCOUNTER — Encounter (INDEPENDENT_AMBULATORY_CARE_PROVIDER_SITE_OTHER): Payer: Self-pay

## 2013-10-26 VITALS — BP 116/78 | HR 70 | Wt 148.0 lb

## 2013-10-26 DIAGNOSIS — G35 Multiple sclerosis: Secondary | ICD-10-CM

## 2013-10-26 DIAGNOSIS — H469 Unspecified optic neuritis: Secondary | ICD-10-CM

## 2013-10-26 DIAGNOSIS — Z5181 Encounter for therapeutic drug level monitoring: Secondary | ICD-10-CM

## 2013-10-26 LAB — CBC WITH DIFFERENTIAL
Basophils Absolute: 0 10*3/uL (ref 0.0–0.2)
Basos: 1 %
EOS: 2 %
Eosinophils Absolute: 0.1 10*3/uL (ref 0.0–0.4)
HEMATOCRIT: 36.3 % (ref 34.0–46.6)
Hemoglobin: 12.7 g/dL (ref 11.1–15.9)
Lymphocytes Absolute: 1.6 10*3/uL (ref 0.7–3.1)
Lymphs: 26 %
MCH: 28.1 pg (ref 26.6–33.0)
MCHC: 35 g/dL (ref 31.5–35.7)
MCV: 80 fL (ref 79–97)
MONOCYTES: 15 %
Monocytes Absolute: 0.9 10*3/uL (ref 0.1–0.9)
NEUTROS ABS: 3.5 10*3/uL (ref 1.4–7.0)
Neutrophils Relative %: 56 %
PLATELETS: 330 10*3/uL (ref 150–379)
RBC: 4.52 x10E6/uL (ref 3.77–5.28)
RDW: 14.6 % (ref 12.3–15.4)
WBC: 6.1 10*3/uL (ref 3.4–10.8)

## 2013-10-26 LAB — COMPREHENSIVE METABOLIC PANEL
A/G RATIO: 1.6 (ref 1.1–2.5)
ALT: 24 IU/L (ref 0–32)
AST: 20 IU/L (ref 0–40)
Albumin: 4.5 g/dL (ref 3.5–5.5)
Alkaline Phosphatase: 69 IU/L (ref 39–117)
BILIRUBIN TOTAL: 0.2 mg/dL (ref 0.0–1.2)
BUN/Creatinine Ratio: 13 (ref 8–20)
BUN: 8 mg/dL (ref 6–20)
CHLORIDE: 103 mmol/L (ref 96–108)
CO2: 31 mmol/L — ABNORMAL HIGH (ref 18–29)
Calcium: 9.8 mg/dL (ref 8.7–10.2)
Creatinine, Ser: 0.6 mg/dL (ref 0.57–1.00)
GFR calc Af Amer: 149 mL/min/{1.73_m2} (ref >59)
GFR, EST NON AFRICAN AMERICAN: 129 mL/min/{1.73_m2} (ref >59)
GLOBULIN, TOTAL: 2.8 g/dL (ref 1.5–4.5)
Glucose: 77 mg/dL (ref 65–99)
Potassium: 4.1 mmol/L (ref 3.5–5.2)
SODIUM: 140 mmol/L (ref 134–144)
TOTAL PROTEIN: 7.3 g/dL (ref 6.0–8.5)

## 2013-10-26 MED ORDER — NORTRIPTYLINE HCL 10 MG PO CAPS
30.0000 mg | ORAL_CAPSULE | Freq: Every day | ORAL | Status: DC
Start: 2013-10-26 — End: 2014-04-28

## 2013-10-26 NOTE — Progress Notes (Signed)
Reason for visit: Multiple sclerosis  Toni Weaver is an 23 y.o. female  History of present illness:  Toni Weaver is a 23 year old right-handed black female with a history of multiple sclerosis. The patient has a prior history of optic neuritis on the right. The patient will have intermittent blurring of vision in that eye, but this is not persistent. The patient has not had any new weakness or balance issues or bladder control problems since last seen. The patient does report chronic constipation, and she will take MiraLax intermittently. The patient has not had any falls. The patient does report some tingling in the feet, she is on nortriptyline at 30 mg at night to help with this. The patient is sleeping relatively well. The patient has had a minor elevation in the SGPT liver enzyme on her last blood draw in September 2014. The patient returns to this office for an evaluation. The patient is on Rebif, and she is tolerating the medication well. The patient denies any problems with depression.  Past Medical History  Diagnosis Date  . Multiple sclerosis 11/24/2012  . Optic neuritis 11/24/2012    Past Surgical History  Procedure Laterality Date  . Appendectomy      Family History  Problem Relation Age of Onset  . Hypertension Mother   . Hypertension Father   . Cancer Maternal Grandmother     Breast cancer    Social history:  reports that she has never smoked. She does not have any smokeless tobacco history on file. She reports that she does not drink alcohol or use illicit drugs.   No Known Allergies  Medications:  Current Outpatient Prescriptions on File Prior to Visit  Medication Sig Dispense Refill  . interferon beta-1a (REBIF) 44 MCG/0.5ML injection Inject 0.5 mLs (44 mcg total) into the skin 3 (three) times a week. Please Dispense Rebidose-- 49ml=28 days since each syringe is 0.35ml.  6 mL  6  . Multiple Vitamin (MULTIVITAMIN) tablet Take 1 tablet by mouth daily.       No  current facility-administered medications on file prior to visit.    ROS:  Out of a complete 14 system review of symptoms, the patient complains only of the following symptoms, and all other reviewed systems are negative.  Fatigue Excessive thirst Constipation, rectal pain Joint pain Decreased concentration  Blood pressure 116/78, pulse 70, weight 148 lb (67.132 kg).  Physical Exam  General: The patient is alert and cooperative at the time of the examination.  Skin: No significant peripheral edema is noted.   Neurologic Exam  Mental status: The patient is oriented x 3.  Cranial nerves: Facial symmetry is present. Speech is normal, no aphasia or dysarthria is noted. Extraocular movements are full. Visual fields are full. Pupils are equal, round, and reactive to light. Discs are flat bilaterally. 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 No evidence of an internuclear ophthalmoplegia is seen.  Motor: The patient has good strength in all 4 extremities.  Sensory examination: Pinprick sensation and vibration sensation are symmetric on the arms and legs.  Coordination: The patient has good finger-nose-finger and heel-to-shin bilaterally.  Gait and station: The patient has a normal gait. Tandem gait is normal. Romberg is negative. No drift is seen.  Reflexes: Deep tendon reflexes are notable for a relative increase in reflexes on the left biceps and left knee jerk reflex.   Assessment/Plan:  1. Multiple sclerosis  2. History of right optic neuritis  The patient is doing well on  Rebif at this time. The patient is having issues with constipation, and she is to go on MiraLax on a daily basis. The patient will have blood work checked today to reevaluate the liver enzymes. The patient will followup through this office in 6 or 7 months. We will plan on checking another MRI of the brain and cervical spinal cord at that time.  Marlan Palau. Keith Magenta Schmiesing MD 10/26/2013 7:31 PM  [facility] [address] [place], [place] [id number]  Phone [phone] Fax [phone]

## 2013-10-26 NOTE — Patient Instructions (Signed)
Multiple Sclerosis  Multiple sclerosis (MS) is a disease of the central nervous system. It leads to loss of the insulating covering of the nerves (myelin sheath) of your brain. When this happens, brain signals do not get transmitted properly or may not get transmitted at all. The symptoms of MS occur in episodes or attacks. These attacks may last weeks to months. There may be long periods of nearly no problems between attacks. The age of onset of MS varies.   CAUSES  The cause of MS is unknown. However, it is more common in the northern [place] than in the southern [place].  RISK FACTORS  There is a higher incidence of MS in women than in men. MS is not an inherited illness, although your risk of MS is higher if you have a relative with MS.  SIGNS AND SYMPTOMS   The symptoms of MS occur in episodes or attacks. These attacks may last weeks to months. There may be long periods of almost no symptoms between attacks.  The symptoms of MS vary. This is because of the many different ways it affects the central nervous system. The main symptoms of MS include:   Vision problems and eye pain.   Numbness.   Weakness.   Paralysis in your arms, hands, feet, and legs (extremities).   Balance problems.   Tremors.  DIAGNOSIS   Your health care provider can diagnose MS with the help of imaging exams and lab tests. These may include specialized X-ray exams and spinal fluid tests. The best imaging exam to confirm a diagnosis of MS is MRI.  TREATMENT   There is no known cure for MS, but there are medicines that can decrease the number and frequency of attacks. Steroids are often used for short-term relief. Physical and occupational therapy may also help.  HOME CARE INSTRUCTIONS    Take medicines as directed by your health care provider.   Exercise as directed by your health care provider.  SEEK MEDICAL CARE IF:  You begin to feel depressed.  SEEK IMMEDIATE MEDICAL CARE IF:   You develop paralysis.   You develop  problems with bladder, bowel, or sexual function.   You develop mental changes, such as forgetfulness or mood swings.   You have a seizure.  Document Released: 07/18/2000 Document Revised: 05/11/2013 Document Reviewed: 03/28/2013  ExitCare Patient Information 2014 [employer].

## 2013-10-27 ENCOUNTER — Ambulatory Visit: Payer: Self-pay | Admitting: Neurology

## 2013-10-28 ENCOUNTER — Ambulatory Visit: Payer: Managed Care, Other (non HMO) | Admitting: Neurology

## 2013-11-17 ENCOUNTER — Telehealth: Payer: Self-pay | Admitting: *Deleted

## 2013-11-17 NOTE — Telephone Encounter (Signed)
Patient called requesting a call back regarding really bad migraines.  Please return patients call.  Thanks

## 2013-11-18 MED ORDER — PREDNISONE 5 MG PO TABS: ORAL_TABLET | ORAL | Status: DC

## 2013-11-18 NOTE — Telephone Encounter (Signed)
I called patient. The patient has been doing well with her headaches on nortriptyline at 30 mg at night until this week. The patient has had daily headaches this week. The patient took Tylenol this morning, currently does not have a headache. I will call in a prednisone Dosepak if the headache continues. If the headaches appear to be more frequent in the upcoming days, we may need to go up on the nortriptyline dosing.

## 2013-11-18 NOTE — Telephone Encounter (Signed)
Patient called requesting a call back regarding really bad migraines.  Please return patients call.  Thanks 

## 2013-11-25 ENCOUNTER — Telehealth: Payer: Self-pay | Admitting: Neurology

## 2013-11-25 NOTE — Telephone Encounter (Signed)
This patient was seen by an ophthalmologist, normal visual fields and OCT seen.

## 2013-12-12 ENCOUNTER — Telehealth: Payer: Self-pay | Admitting: Neurology

## 2013-12-12 NOTE — Telephone Encounter (Signed)
Spoke with patient and she said that she is trying to drink more fluids daily( 16 oz daily), her stomach is hard, has been no change in her diet. The miralax is helping a little (takes every 2 days)

## 2013-12-12 NOTE — Telephone Encounter (Signed)
Patient called to state that she has been having blood in her stool since this morning as well as stomach pain, please call patient and advise.

## 2013-12-12 NOTE — Telephone Encounter (Signed)
I called patient. The patient is having bowel movements with MiraLax, but the stools are hard, and she has to strain at times. She had some bright red blood with her bowel movement recently. This likely represents a hemorrhoid problem. The patient is to go on Colace, 100 mg daily. She will continue the MiraLax. If the bleeding becomes excessive or frequent, she may need to see a gastroenterologist.

## 2014-02-06 ENCOUNTER — Telehealth: Payer: Self-pay | Admitting: Neurology

## 2014-02-06 MED ORDER — INTERFERON BETA-1A 44 MCG/0.5ML ~~LOC~~ SOLN: 44.0000 ug | SUBCUTANEOUS | Status: DC

## 2014-02-06 NOTE — Telephone Encounter (Signed)
Patient stated she has a headache and wanting to know if there's something else she could take until Rebiff is refilled.  Please call and advise.

## 2014-02-06 NOTE — Telephone Encounter (Signed)
I called back.  Said she is taking Nortriptyline as prescribed, and it does help.  States she took some Tylenol for HA, and seems to be doing better.  She inquired about the Rebif being sent to her new pharmacy, advised we did call them earlier today.  She will call us back if anything further is needed.

## 2014-02-06 NOTE — Telephone Encounter (Signed)
I called back.  Victorino Dike said the patient's ins has changed, and [facility] is no longer able to service the patient.  She asked that we call the new pharmacy, [facility] at [phone] to provide Rx so they can fill it.  The number provided is a fax line.  I added this pharmacy to the chart, removed [facility] and sent the Rx.   I also called the pharmacy and spoke with [facility].  They took patient's info verbally and will enter it in the system.

## 2014-02-06 NOTE — Telephone Encounter (Signed)
Toni Weaver with [facility] requesting Rx refill for interferon beta-1a (REBIF) 44 MCG/0.5ML injection.  Please call and advise.

## 2014-02-12 ENCOUNTER — Telehealth: Payer: Self-pay

## 2014-02-12 NOTE — Telephone Encounter (Signed)
[employer] sent [place] a letter saying they have approved our request for coverage on Rebif effective until 02/08/2015 Ref # [id number]

## 2014-04-28 ENCOUNTER — Encounter (INDEPENDENT_AMBULATORY_CARE_PROVIDER_SITE_OTHER): Payer: Self-pay

## 2014-04-28 ENCOUNTER — Ambulatory Visit (INDEPENDENT_AMBULATORY_CARE_PROVIDER_SITE_OTHER): Payer: Managed Care, Other (non HMO) | Admitting: Nurse Practitioner

## 2014-04-28 ENCOUNTER — Encounter: Payer: Self-pay | Admitting: Nurse Practitioner

## 2014-04-28 VITALS — BP 137/84 | HR 100 | Ht 63.0 in | Wt 146.8 lb

## 2014-04-28 DIAGNOSIS — Z8669 Personal history of other diseases of the nervous system and sense organs: Secondary | ICD-10-CM

## 2014-04-28 DIAGNOSIS — G35 Multiple sclerosis: Secondary | ICD-10-CM

## 2014-04-28 LAB — CBC WITH DIFFERENTIAL
Basophils Absolute: 0 10*3/uL (ref 0.0–0.2)
Basos: 0 %
Eos: 2 %
Eosinophils Absolute: 0.1 10*3/uL (ref 0.0–0.4)
HCT: 34.8 % (ref 34.0–46.6)
Hemoglobin: 12.2 g/dL (ref 11.1–15.9)
Lymphocytes Absolute: 1.8 10*3/uL (ref 0.7–3.1)
Lymphs: 33 %
MCH: 27.8 pg (ref 26.6–33.0)
MCHC: 35.1 g/dL (ref 31.5–35.7)
MCV: 79 fL (ref 79–97)
Monocytes Absolute: 0.6 10*3/uL (ref 0.1–0.9)
Monocytes: 11 %
Neutrophils Absolute: 2.9 10*3/uL (ref 1.4–7.0)
Neutrophils Relative %: 54 %
Platelets: 318 10*3/uL (ref 150–379)
RBC: 4.39 x10E6/uL (ref 3.77–5.28)
RDW: 13.9 % (ref 12.3–15.4)
WBC: 5.4 10*3/uL (ref 3.4–10.8)

## 2014-04-28 LAB — COMPREHENSIVE METABOLIC PANEL
ALT: 20 IU/L (ref 0–32)
AST: 25 IU/L (ref 0–40)
Albumin/Globulin Ratio: 1.6 (ref 1.1–2.5)
Albumin: 4.5 g/dL (ref 3.5–5.5)
Alkaline Phosphatase: 60 IU/L (ref 39–117)
BUN/Creatinine Ratio: 37 — ABNORMAL HIGH (ref 8–20)
BUN: 23 mg/dL — ABNORMAL HIGH (ref 6–20)
CO2: 29 mmol/L (ref 18–29)
Calcium: 9.5 mg/dL (ref 8.7–10.2)
Chloride: 102 mmol/L (ref 96–108)
Creatinine, Ser: 0.63 mg/dL (ref 0.57–1.00)
GFR calc Af Amer: 146 mL/min/{1.73_m2} (ref >59)
GFR calc non Af Amer: 127 mL/min/{1.73_m2} (ref >59)
Globulin, Total: 2.8 g/dL (ref 1.5–4.5)
Glucose: 130 mg/dL — ABNORMAL HIGH (ref 65–99)
Potassium: 4 mmol/L (ref 3.5–5.2)
Sodium: 139 mmol/L (ref 134–144)
Total Bilirubin: 0.2 mg/dL (ref 0.0–1.2)
Total Protein: 7.3 g/dL (ref 6.0–8.5)

## 2014-04-28 MED ORDER — NORTRIPTYLINE HCL 10 MG PO CAPS
30.0000 mg | ORAL_CAPSULE | Freq: Every day | ORAL | Status: DC
Start: 2014-04-28 — End: 2014-11-15

## 2014-04-28 NOTE — Progress Notes (Signed)
PATIENT: Toni Weaver DOB: August 05, 1990  REASON FOR VISIT: routine follow up for MS HISTORY FROM: patient, mother  HISTORY OF PRESENT ILLNESS: Toni Weaver is a 23 year old right-handed black female with a history of multiple sclerosis. The patient has a prior history of optic neuritis on the right. The patient will have intermittent blurring of vision in that eye, but this is not persistent. The patient has not had any new weakness or balance issues or bladder control problems since last seen. The patient does report chronic constipation, and she now takes MiraLax daily which has improved her constipation. She has started taking colace at times as well. She reports no more needing to strain using the bathroom and has had no more instances of blood in her stool. The patient has not had any falls. The patient has had headaches in the past, she is on nortriptyline at 30 mg at night to help with this. She does not report any headaches in the last few months. The patient is sleeping relatively well. The patient has had a minor elevation in the SGPT liver enzyme on her last blood draw in September 2014, which normalized with the blood draw in March. The patient returns to this office for an evaluation. The patient is on Rebif, and she is tolerating the medication well. The patient denies any problems with depression. She has no complaints today.  ROS:  Out of a complete 14 system review of symptoms, the patient complains only of the following symptoms, and all other reviewed systems are negative.  None  ALLERGIES: No Known Allergies  HOME MEDICATIONS: Outpatient Prescriptions Prior to Visit  Medication Sig Dispense Refill  . interferon beta-1a (REBIF) 44 MCG/0.5ML injection Inject 0.5 mLs (44 mcg total) into the skin 3 (three) times a week. Please Dispense Rebidose-- 55ml=28 days since each syringe is 0.73ml.  6 mL  6  . Multiple Vitamin (MULTIVITAMIN) tablet Take 1 tablet by mouth daily.      .  nortriptyline (PAMELOR) 10 MG capsule Take 3 capsules (30 mg total) by mouth at bedtime.  90 capsule  5  . predniSONE (DELTASONE) 5 MG tablet Began taking 6 tablets daily, taper by one tablet daily until off the medication.  21 tablet  0   No facility-administered medications prior to visit.    PHYSICAL EXAM Filed Vitals:   04/28/14 0839  BP: 137/84  Pulse: 100  Height:  (1.6 m)  Weight: 146 lb 12.8 oz (66.588 kg)   Body mass index is 26.01 kg/(m^2).  Physical Exam  General: The patient is alert and cooperative at the time of the examination.  Skin: No significant peripheral edema is noted.   Neurologic Exam  Mental status: The patient is oriented x 3.  Cranial nerves: Facial symmetry is present. Speech is normal, no aphasia or dysarthria is noted. Extraocular movements are full. Visual fields are full. Pupils are equal, round, and reactive to light. Discs are flat bilaterally. 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 No evidence of an internuclear ophthalmoplegia is seen.  Motor: The patient has good strength in all 4 extremities.  Sensory examination: Pinprick sensation and vibration sensation are symmetric on the arms and legs.  Coordination: The patient has good finger-nose-finger and heel-to-shin bilaterally.  Gait and station: The patient has a normal gait. Tandem gait is normal. Romberg is negative. No drift is seen.  Reflexes: Deep tendon reflexes are notable for a relative increase in reflexes on the left biceps.  DIAGNOSTIC DATA (  LABS, IMAGING, TESTING) - I reviewed patient records, labs, notes, testing and imaging myself where available.  Lab Results  Component Value Date   WBC 6.1 10/26/2013   HGB 12.7 10/26/2013   HCT 36.3 10/26/2013   MCV 80 10/26/2013   PLT 330 10/26/2013      Component Value Date/Time   NA 140 10/26/2013 0919   K 4.1 10/26/2013 0919   CL 103 10/26/2013 0919   CO2 31* 10/26/2013 0919   GLUCOSE 77 10/26/2013 0919   BUN 8 10/26/2013 0919    CREATININE 0.60 10/26/2013 0919   CALCIUM 9.8 10/26/2013 0919   PROT 7.3 10/26/2013 0919   AST 20 10/26/2013 0919   ALT 24 10/26/2013 0919   ALKPHOS 69 10/26/2013 0919   BILITOT 0.2 10/26/2013 0919   GFRNONAA 129 10/26/2013 0919   GFRAA 149 10/26/2013 0919    ASSESSMENT: 1. Multiple sclerosis  2. History of right optic neuritis  The patient is doing well on Rebif at this time. The patient will have blood work checked today to reevaluate the liver enzymes again today and check cbc. The patient will followup through this office in 6 or 7 months. We will check another MRI of the brain and cervical spinal cord. This patient was seen by an ophthalmologist last year, normal visual fields and OCT seen.  PLAN: check another MRI of the brain and cervical spinal cord with and without contrast Check labs today - CBC wdiff, CMP Continue Rebif Continue Miralax and Colace for constipation Contine Pamelor Follow up in 6 months, sooner as needed.  Orders Placed This Encounter  Procedures  . MR Brain W Wo Contrast  . MR Cervical Spine W Wo Contrast  . CBC With differential/Platelet  . Comprehensive metabolic panel   Meds ordered this encounter  Medications  . nortriptyline (PAMELOR) 10 MG capsule    Sig: Take 3 capsules (30 mg total) by mouth at bedtime.    Dispense:  90 capsule    Refill:  5    Order Specific Question:  Supervising Provider    Answer:  Suanne Marker [[id number]]   Return in about 6 months (around 10/27/2014) for MS.  Tawny Asal Demarqus Jocson, MSN, FNP-BC, A/GNP-C 04/28/2014, 1:10 PM [facility] [address], [address] [place], [place] [address] [phone]  Note: This document was prepared with digital dictation and possible smart phrase technology. Any transcriptional errors that result from this process are unintentional.

## 2014-04-28 NOTE — Patient Instructions (Signed)
Check another MRI of the brain and cervical spinal cord. Someone will call you to schedule. Check labs today - CBC wdiff, CMP Continue Rebif Continue Miralax and Colace for constipation Contine Pamelor, may reduce dose if you like, reduce by 1 tablet every 2 weeks if you want to wean.  This medication is used to prevent headaches, reduce neuropathic pain and also helps people sleep.  Follow up in 6 months, sooner as needed.

## 2014-04-28 NOTE — Progress Notes (Signed)
I have read the note, and I agree with the clinical assessment and plan.  WILLIS,CHARLES KEITH   

## 2014-05-01 ENCOUNTER — Telehealth: Payer: Self-pay | Admitting: *Deleted

## 2014-05-01 NOTE — Telephone Encounter (Signed)
Message copied by Salome Spotted on Mon May 01, 2014 12:35 PM ------      Message from: LAM, Larita Fife E      Created: Mon May 01, 2014 10:45 AM       Please call patient with normal lab results. ------

## 2014-05-01 NOTE — Telephone Encounter (Signed)
Called patient lvm letting her know her results was normal.

## 2014-05-02 DIAGNOSIS — G35 Multiple sclerosis: Secondary | ICD-10-CM

## 2014-05-05 ENCOUNTER — Other Ambulatory Visit: Payer: Self-pay | Admitting: Neurology

## 2014-05-05 DIAGNOSIS — Z8669 Personal history of other diseases of the nervous system and sense organs: Secondary | ICD-10-CM

## 2014-05-05 DIAGNOSIS — G35 Multiple sclerosis: Secondary | ICD-10-CM

## 2014-05-08 ENCOUNTER — Telehealth: Payer: Self-pay | Admitting: Nurse Practitioner

## 2014-05-08 NOTE — Telephone Encounter (Signed)
Called and left msg on patient's cell phone, [phone] that MRI results showed no changes.  Advised to call the office if she had questions.  <<<<<<<<<<<<<<<<<<<<<<<<<<<<<<<<<<<<<<<<< IMPRESSION: Abnormal MRI scan of the brain showing multiple scattered periventricular and subcortical white matter hyperintensities which are typical for demyelinating disease. No enhancing lesions are noted. The presence of T1 black holes indicates chronic disease.  IMPRESSION: Abnormal MRI scan of the cervical spine showing ill-defined spinal cord hyperintensities at C2-3, C4-5, C7 and T2 likely remote age demyelinating plaques. No enhancing lesions are noted.

## 2014-07-26 ENCOUNTER — Telehealth: Payer: Self-pay | Admitting: Neurology

## 2014-07-26 MED ORDER — INTERFERON BETA-1A 44 MCG/0.5ML ~~LOC~~ SOSY
44.0000 ug | PREFILLED_SYRINGE | SUBCUTANEOUS | Status: DC
Start: 2014-07-26 — End: 2014-11-22

## 2014-07-26 NOTE — Telephone Encounter (Signed)
Rx has been sent.  Pharmacy aware.

## 2014-07-26 NOTE — Telephone Encounter (Signed)
Toni Weaver with [facility] @ [phone], requesting Rx refill for interferon beta-1a (REBIF) 44 MCG/0.5ML injection.  Please call and advise.

## 2014-07-26 NOTE — Telephone Encounter (Signed)
Toni Weaver with [facility] @ [phone], stated she received Rx refill for REBIF.   Questioning the Rx received for syringes and normally patient receives auto injectors.  Please call and advise.

## 2014-07-26 NOTE — Telephone Encounter (Signed)
I called back.  Spoke with Josh who transferred me to Maralyn SagoSarah.  They will fill Rx as patient has been getting it and will call us back if anything further is needed.

## 2014-10-27 ENCOUNTER — Ambulatory Visit (INDEPENDENT_AMBULATORY_CARE_PROVIDER_SITE_OTHER): Payer: Managed Care, Other (non HMO) | Admitting: Neurology

## 2014-10-27 ENCOUNTER — Encounter: Payer: Self-pay | Admitting: Neurology

## 2014-10-27 VITALS — BP 139/87 | HR 116 | Ht 64.0 in | Wt 149.4 lb

## 2014-10-27 DIAGNOSIS — G35 Multiple sclerosis: Secondary | ICD-10-CM | POA: Diagnosis not present

## 2014-10-27 DIAGNOSIS — H469 Unspecified optic neuritis: Secondary | ICD-10-CM

## 2014-10-27 NOTE — Progress Notes (Signed)
Reason for visit: Multiple sclerosis  Toni Weaver is an 24 y.o. female  History of present illness:  Toni Weaver is a 24 year old right-handed black female with a history of multiple sclerosis. She is on Rebif, tolerating the medication well. She reports some fatigue when she has to walk longer distances. Otherwise, she has not noted any new problems. She has had optic neuritis in the past, but she denies any issues at this time. She reports no numbness or weakness of extremities, balance problems, or difficulty controlling the bowels or the bladder. She has not had any falls. She returns to the office today for an evaluation. She has had a recent MRI of the brain done in the fall of 2015. Blood work was done in September 2015. She returns for an evaluation.  Past Medical History  Diagnosis Date  . Multiple sclerosis 11/24/2012  . Optic neuritis 11/24/2012    Past Surgical History  Procedure Laterality Date  . Appendectomy      Family History  Problem Relation Age of Onset  . Hypertension Mother   . Hypertension Father   . Cancer Maternal Grandmother     Breast cancer    Social history:  reports that she has never smoked. She has never used smokeless tobacco. She reports that she does not drink alcohol or use illicit drugs.   No Known Allergies  Medications:  Prior to Admission medications   Medication Sig Start Date End Date Taking? Authorizing Provider  interferon beta-1a (REBIF) 44 MCG/0.5ML SOSY injection Inject 0.5 mLs (44 mcg total) into the skin 3 (three) times a week. 07/26/14  Yes York Spaniel, MD  Multiple Vitamin (MULTIVITAMIN) tablet Take 1 tablet by mouth daily.   Yes Historical Provider, MD  nortriptyline (PAMELOR) 10 MG capsule Take 3 capsules (30 mg total) by mouth at bedtime. 04/28/14  Yes Ronal Fear, NP    ROS:  Out of a complete 14 system review of symptoms, the patient complains only of the following symptoms, and all other reviewed systems are  negative.  Walking difficulty Incontinence of bowel  Blood pressure 139/87, pulse 116, height 5\' 4"  (1.626 m), weight 149 lb 6.4 oz (67.767 kg).  Physical Exam  General: The patient is alert and cooperative at the time of the examination.  Skin: No significant peripheral edema is noted.   Neurologic Exam  Mental status: The patient is alert and oriented x 3 at the time of the examination. The patient has apparent normal recent and remote memory, with an apparently normal attention span and concentration ability.   Cranial nerves: Facial symmetry is present. Speech is normal, no aphasia or dysarthria is noted. Extraocular movements are full. Visual fields are full. Pupils are equal, round, and reactive to light. Discs are flat bilaterally.  Motor: The patient has good strength in all 4 extremities.  Sensory examination: Soft touch sensation is symmetric on the face, arms, and legs.  Coordination: The patient has good finger-nose-finger and heel-to-shin bilaterally.  Gait and station: The patient has a normal gait. Tandem gait is normal. Romberg is negative. No drift is seen.  Reflexes: Deep tendon reflexes are symmetric.   MRI brain 05/05/14:  IMPRESSION: Abnormal MRI scan of the brain showing multiple scattered periventricular and subcortical white matter hyperintensities which are typical for demyelinating disease. No enhancing lesions are noted. The presence of T1 black holes indicates chronic disease.    Assessment/Plan:  1. Multiple sclerosis   2. Optic neuritis  The  patient is doing well at this time, she remains on Rebif. We will see her back in about 6 or 7 months. We will recheck blood work at that time. We will follow the MRI over time, she last had MRI done in October 2015. She will contact our office if any new issues arise. The patient is reporting some fatigue with walking, I have indicated that if she is to exercise, she may need to get into a swimming  pool that will prevent her from overheating with exercise.  Marlan Palau MD 10/27/2014 5:27 PM  [facility] [address] [place], [place] [address]  Phone [phone] Fax [phone]

## 2014-10-27 NOTE — Patient Instructions (Signed)

## 2014-11-15 ENCOUNTER — Telehealth: Payer: Self-pay | Admitting: Neurology

## 2014-11-15 MED ORDER — DULOXETINE HCL 30 MG PO CPEP: ORAL_CAPSULE | ORAL | Status: DC

## 2014-11-15 NOTE — Telephone Encounter (Signed)
Patient's mother stated patient experiencing severe constipation after taking Rx nortriptyline (PAMELOR) 10 MG capsule.  Questioning if there's an alternative medication that would be more beneficial to patient.  Please call and advise.

## 2014-11-19 ENCOUNTER — Other Ambulatory Visit: Payer: Self-pay | Admitting: Nurse Practitioner

## 2014-11-21 ENCOUNTER — Telehealth: Payer: Self-pay | Admitting: Neurology

## 2014-11-21 MED ORDER — NORTRIPTYLINE HCL 10 MG PO CAPS: 10.0000 mg | ORAL_CAPSULE | Freq: Every day | ORAL | Status: DC

## 2014-11-21 NOTE — Telephone Encounter (Signed)
Patient called requesting a refill fornortriptyline (PAMELOR) 10 MG. Patient does not want to take DULoxetine (CYMBALTA) 30 MG capsule.  Pharmacy: [facility] in [place] # [phone] Patient's call back is [phone]

## 2014-11-21 NOTE — Telephone Encounter (Signed)
The patient wishes to stay with the nortriptyline. I will call in a Rx.

## 2014-11-21 NOTE — Telephone Encounter (Signed)
It appears Nortriptyilne was changed per a request we recieved on 04/13.  I called back and spoke with the patient.  Says she has decided she would like to stay on Nortriptyline and not proceed with Cymbalta.  She requested the message be sent to Dr Anne Hahn.  Advised he is not available today.  Says she still has some meds on hand, and will wait for his return.  Declined offer to send message to [facility].

## 2014-11-22 ENCOUNTER — Other Ambulatory Visit: Payer: Self-pay | Admitting: Neurology

## 2015-03-07 ENCOUNTER — Telehealth: Payer: Self-pay | Admitting: Neurology

## 2015-03-07 NOTE — Telephone Encounter (Signed)
Please call Karen/ [facility] with ICD10 code for REBIF REBIDOSE 44 MCG/0.5ML SOAJ

## 2015-03-07 NOTE — Telephone Encounter (Signed)
I called back.  Spoke with Max.  He was not able to take info, and transferred me to Clydie Braun.  I provided ICD code.  She will note patient's file.  They will call back if anything further is needed.

## 2015-03-27 ENCOUNTER — Telehealth: Payer: Self-pay | Admitting: Neurology

## 2015-03-27 MED ORDER — ONDANSETRON 4 MG PO TBDP
4.0000 mg | ORAL_TABLET | Freq: Three times a day (TID) | ORAL | Status: DC | PRN
Start: 2015-03-27 — End: 2022-01-27

## 2015-03-27 NOTE — Telephone Encounter (Signed)
Patient is calling and states she is having constant bad nausea(not throwing up). Can she have something for nausea. Please call.

## 2015-03-27 NOTE — Telephone Encounter (Signed)
I called patient. If she continues to have nausea, the use of Rebif may need to be reconsidered. I will call in a prescription for Zofran for now.

## 2015-03-27 NOTE — Telephone Encounter (Signed)
I called the patient. She has been nauseous for a few weeks now. She thinks it is a side effect of the rebif. She has taken her mother's zofran and it helped. She would like to know if she can have a prescription for zofran.

## 2015-04-24 ENCOUNTER — Ambulatory Visit: Payer: Managed Care, Other (non HMO) | Admitting: Adult Health

## 2015-06-06 ENCOUNTER — Other Ambulatory Visit: Payer: Self-pay | Admitting: Neurology

## 2015-07-04 ENCOUNTER — Other Ambulatory Visit: Payer: Self-pay | Admitting: Neurology

## 2015-07-05 ENCOUNTER — Other Ambulatory Visit: Payer: Self-pay | Admitting: Neurology

## 2015-07-10 ENCOUNTER — Telehealth: Payer: Self-pay | Admitting: Neurology

## 2015-07-10 NOTE — Telephone Encounter (Signed)
It does not appear have the [employer] info on file.  I called the patient back.  Got no answer.  Left message.   If patient calls back, please obtain Ins phone, Patient ID# and if she has any other info (i.e. Group#, BIN # Fax number)  We will be happy to contact new plan once we have updated info.

## 2015-07-10 NOTE — Telephone Encounter (Signed)
Patient called, states she received letter from [facility] stating REBIF REBIDOSE 44 MCG/0.5ML SOAJ will no longer be covered by her insurance, needs to pick another medication, either Avonex or Betaseron, please call to advise.

## 2015-07-11 NOTE — Telephone Encounter (Signed)
Patient returned call, doesn't have any of the information for [employer] [facility], will call her HR dept at work to obtain this.

## 2015-07-17 ENCOUNTER — Ambulatory Visit (INDEPENDENT_AMBULATORY_CARE_PROVIDER_SITE_OTHER): Payer: Managed Care, Other (non HMO) | Admitting: Adult Health

## 2015-07-17 ENCOUNTER — Encounter: Payer: Self-pay | Admitting: Adult Health

## 2015-07-17 VITALS — BP 121/81 | HR 94 | Ht 64.0 in | Wt 146.5 lb

## 2015-07-17 DIAGNOSIS — Z5181 Encounter for therapeutic drug level monitoring: Secondary | ICD-10-CM

## 2015-07-17 DIAGNOSIS — G35 Multiple sclerosis: Secondary | ICD-10-CM

## 2015-07-17 NOTE — Patient Instructions (Signed)
Continue Rebif I will check blood work today If your symptoms worsen or you develop new symptoms please let us know.

## 2015-07-17 NOTE — Progress Notes (Signed)
PATIENT: Toni Weaver DOB: 1991-04-23  REASON FOR VISIT: follow up- multiple sclerosis HISTORY FROM: patient  HISTORY OF PRESENT ILLNESS: Toni Weaver is a 24 year old female with a history of multiple sclerosis. She returns today for follow-up. She is currently on Rebif and tolerating it well. She denies any new numbness or weakness. Denies any changes with her gait or balance. She states that she is really fatigue she has a hard time ambulating. Denies any changes with the bowels or bladder. She does report some blurry vision in the right eye that started approximately 1 week ago. She started wearing her glasses and this has improved. She denies any new neurological symptoms. She returns today for an evaluation.  HISTORY 10/27/14 (WILLIS): Toni Weaver is a 24 year old right-handed black female with a history of multiple sclerosis. She is on Rebif, tolerating the medication well. She reports some fatigue when she has to walk longer distances. Otherwise, she has not noted any new problems. She has had optic neuritis in the past, but she denies any issues at this time. She reports no numbness or weakness of extremities, balance problems, or difficulty controlling the bowels or the bladder. She has not had any falls. She returns to the office today for an evaluation. She has had a recent MRI of the brain done in the fall of 2015. Blood work was done in September 2015. She returns for an evaluation.  REVIEW OF SYSTEMS: Out of a complete 14 system review of symptoms, the patient complains only of the following symptoms, and all other reviewed systems are negative.  Cold intolerance, heat intolerance, walking difficulty  ALLERGIES: No Known Allergies  HOME MEDICATIONS: Outpatient Prescriptions Prior to Visit  Medication Sig Dispense Refill  . Multiple Vitamin (MULTIVITAMIN) tablet Take 1 tablet by mouth daily.    . ondansetron (ZOFRAN ODT) 4 MG disintegrating tablet Take 1 tablet (4 mg total)  by mouth every 8 (eight) hours as needed for nausea or vomiting. 30 tablet 1  . REBIF REBIDOSE 44 MCG/0.5ML SOAJ Inject 0.5 mLs (44 mcg total) into the skin 3 times a week. 6 mL 0  . nortriptyline (PAMELOR) 10 MG capsule Take 1 capsule (10 mg total) by mouth at bedtime. (Patient not taking: Reported on 07/17/2015) 90 capsule 5   No facility-administered medications prior to visit.    PAST MEDICAL HISTORY: Past Medical History  Diagnosis Date  . Multiple sclerosis (HCC) 11/24/2012  . Optic neuritis 11/24/2012    PAST SURGICAL HISTORY: Past Surgical History  Procedure Laterality Date  . Appendectomy      FAMILY HISTORY: Family History  Problem Relation Age of Onset  . Hypertension Mother   . Hypertension Father   . Cancer Maternal Grandmother     Breast cancer    SOCIAL HISTORY: Social History   Social History  . Marital Status: Single    Spouse Name: N/A  . Number of Children: 0  . Years of Education: In college   Occupational History  .  Other    Pt. works at [employer]   Social History Main Topics  . Smoking status: Never Smoker   . Smokeless tobacco: Never Used  . Alcohol Use: No  . Drug Use: No  . Sexual Activity: Not on file   Other Topics Concern  . Not on file   Social History Narrative   Patient is right handed.   Patient drinks 2 cups of caffeine daily.      PHYSICAL [place]  Filed Vitals:   07/17/15 1029  BP: 121/81  Pulse: 94  Height: 5\' 4"  (1.626 m)  Weight: 146 lb 8 oz (66.452 kg)   Body mass index is 25.13 kg/(m^2).  Generalized: Well developed, in no acute distress   Neurological examination  Mentation: Alert oriented to time, place, history taking. Follows all commands speech and language fluent Cranial nerve II-XII: Pupils were equal round reactive to light. Extraocular movements were full, visual field were full on confrontational test. Facial sensation and strength were normal. Uvula tongue midline. Head turning and  shoulder shrug  were normal and symmetric. Motor: The motor testing reveals 5 over 5 strength of all 4 extremities. Good symmetric motor tone is noted throughout.  Sensory: Sensory testing is intact to soft touch on all 4 extremities. No evidence of extinction is noted.  Coordination: Cerebellar testing reveals good finger-nose-finger and heel-to-shin bilaterally.  Gait and station: Gait is normal with the exception that her feet are slightly turned out. Tandem gait is slightly unsteady. Romberg is negative. No drift is seen.  Reflexes: Deep tendon reflexes are symmetric and normal bilaterally.   DIAGNOSTIC DATA (LABS, IMAGING, TESTING) - I reviewed patient records, labs, notes, testing and imaging myself where available.  Lab Results  Component Value Date   WBC 5.4 04/28/2014   HGB 12.2 04/28/2014   HCT 34.8 04/28/2014   MCV 79 04/28/2014   PLT 318 04/28/2014      Component Value Date/Time   NA 139 04/28/2014 0906   K 4.0 04/28/2014 0906   CL 102 04/28/2014 0906   CO2 29 04/28/2014 0906   GLUCOSE 130* 04/28/2014 0906   BUN 23* 04/28/2014 0906   CREATININE 0.63 04/28/2014 0906   CALCIUM 9.5 04/28/2014 0906   PROT 7.3 04/28/2014 0906   ALBUMIN 4.5 04/28/2014 0906   AST 25 04/28/2014 0906   ALT 20 04/28/2014 0906   ALKPHOS 60 04/28/2014 0906   BILITOT 0.2 04/28/2014 0906   GFRNONAA 127 04/28/2014 0906   GFRAA 146 04/28/2014 0906       ASSESSMENT AND PLAN 24 y.o. year old female  has a past medical history of Multiple sclerosis (HCC) (11/24/2012) and Optic neuritis (11/24/2012). here with:  1. Multiple sclerosis  Overall the patient is doing well. She will continue on Rebif. I will check blood work today. We will repeat MRI of the brain and cervical spine at the next office visit. Patient is amenable to this plan. Patient advised that if her symptoms worsen or she develops any new symptoms she should let us know. She will follow-up in 6 months or sooner if needed  Butch Penny, MSN, NP-C 07/17/2015, 11:06 AM [facility] [address] [place], [place] [address] [phone]

## 2015-07-17 NOTE — Progress Notes (Signed)
I have read the note, and I agree with the clinical assessment and plan.  WILLIS,CHARLES KEITH   

## 2015-07-18 ENCOUNTER — Other Ambulatory Visit: Payer: Self-pay | Admitting: Neurology

## 2015-07-18 ENCOUNTER — Telehealth: Payer: Self-pay

## 2015-07-18 LAB — COMPREHENSIVE METABOLIC PANEL
ALBUMIN: 4.7 g/dL (ref 3.5–5.5)
ALK PHOS: 58 IU/L (ref 39–117)
ALT: 21 IU/L (ref 0–32)
AST: 18 IU/L (ref 0–40)
Albumin/Globulin Ratio: 1.7 (ref 1.1–2.5)
BILIRUBIN TOTAL: 0.4 mg/dL (ref 0.0–1.2)
BUN / CREAT RATIO: 15 (ref 8–20)
BUN: 10 mg/dL (ref 6–20)
CO2: 24 mmol/L (ref 18–29)
Calcium: 10.1 mg/dL (ref 8.7–10.2)
Chloride: 101 mmol/L (ref 96–106)
Creatinine, Ser: 0.66 mg/dL (ref 0.57–1.00)
GFR calc non Af Amer: 123 mL/min/{1.73_m2} (ref >59)
GFR, EST AFRICAN AMERICAN: 142 mL/min/{1.73_m2} (ref >59)
GLOBULIN, TOTAL: 2.8 g/dL (ref 1.5–4.5)
GLUCOSE: 75 mg/dL (ref 65–99)
Potassium: 4.1 mmol/L (ref 3.5–5.2)
SODIUM: 139 mmol/L (ref 134–144)
TOTAL PROTEIN: 7.5 g/dL (ref 6.0–8.5)

## 2015-07-18 LAB — CBC WITH DIFFERENTIAL/PLATELET
BASOS ABS: 0.1 10*3/uL (ref 0.0–0.2)
Basos: 1 %
EOS (ABSOLUTE): 0.2 10*3/uL (ref 0.0–0.4)
Eos: 3 %
Hematocrit: 35.5 % (ref 34.0–46.6)
Hemoglobin: 12.1 g/dL (ref 11.1–15.9)
IMMATURE GRANS (ABS): 0 10*3/uL (ref 0.0–0.1)
Immature Granulocytes: 0 %
LYMPHS: 30 %
Lymphocytes Absolute: 1.7 10*3/uL (ref 0.7–3.1)
MCH: 27.7 pg (ref 26.6–33.0)
MCHC: 34.1 g/dL (ref 31.5–35.7)
MCV: 81 fL (ref 79–97)
Monocytes Absolute: 0.7 10*3/uL (ref 0.1–0.9)
Monocytes: 13 %
NEUTROS ABS: 2.9 10*3/uL (ref 1.4–7.0)
Neutrophils: 53 %
PLATELETS: 327 10*3/uL (ref 150–379)
RBC: 4.37 x10E6/uL (ref 3.77–5.28)
RDW: 14.1 % (ref 12.3–15.4)
WBC: 5.5 10*3/uL (ref 3.4–10.8)

## 2015-07-18 NOTE — Progress Notes (Signed)
Quick Note:  Lab work unremarkable. Please call the patient. ______

## 2015-07-18 NOTE — Telephone Encounter (Signed)
-----   Message from Butch Penny, NP sent at 07/18/2015  7:53 AM EST ----- Lab work unremarkable. Please call the patient.

## 2015-07-18 NOTE — Telephone Encounter (Signed)
Called patient.  No answer.

## 2015-07-19 NOTE — Telephone Encounter (Signed)
Called patient gave lab results. Patient verbalized understanding.

## 2015-08-20 ENCOUNTER — Telehealth: Payer: Self-pay | Admitting: Neurology

## 2015-08-20 NOTE — Telephone Encounter (Signed)
Pt called said she is struggling walking to classes so she is trying to get a handicapp sticker. She took PCP but he said it needed to be completed by neurologist. She will be dropping that by in the next couple of days.

## 2015-09-25 ENCOUNTER — Telehealth: Payer: Self-pay | Admitting: Neurology

## 2015-09-25 NOTE — Telephone Encounter (Signed)
Patient called to advise she met with Julie/Rebif nurse this past Friday who observed her walking, states Almyra Free observed spasticity and advised for patient to call our office to let Dr. Jannifer Franklin know, so that it could be documented in patient's chart.

## 2015-09-25 NOTE — Telephone Encounter (Signed)
Events noted, we will look for spasticity with the walking on the next visit.

## 2015-10-30 ENCOUNTER — Telehealth: Payer: Self-pay | Admitting: Neurology

## 2015-10-30 NOTE — Telephone Encounter (Signed)
Attempted to contact [facility] - closed for the evening - will try again in the morning.

## 2015-10-30 NOTE — Telephone Encounter (Signed)
Maralyn Sago, Pharmacist/[facility] [phone] called to speak with nurse regarding side effects and issues patient is having with Rebif.

## 2015-10-31 NOTE — Telephone Encounter (Addendum)
I called and spoke to Toni Weaver, with medical management.  She stated the pt told her that she was having trouble walking (gradual) (the last 2 mo) from parking garage to her class.  She has appt 11-15-15 with MM/NP.   I will call pt to see if can come in sooner.

## 2015-10-31 NOTE — Telephone Encounter (Signed)
LMVM for pt to return cal home.  I called her mobile.   Spoke to pt.  She is coming in 11-15-15 and bringing her mother.  Her last MRI 05/2014.  I told her that could move appt up, to check with mother and see and call us back.  Gave her several options with MM/NP.  She verbalized understanding.

## 2015-11-05 ENCOUNTER — Ambulatory Visit (INDEPENDENT_AMBULATORY_CARE_PROVIDER_SITE_OTHER): Payer: Managed Care, Other (non HMO) | Admitting: Neurology

## 2015-11-05 ENCOUNTER — Encounter: Payer: Self-pay | Admitting: Neurology

## 2015-11-05 VITALS — BP 110/89 | HR 84 | Ht 64.0 in | Wt 149.0 lb

## 2015-11-05 DIAGNOSIS — G35 Multiple sclerosis: Secondary | ICD-10-CM

## 2015-11-05 DIAGNOSIS — Z5181 Encounter for therapeutic drug level monitoring: Secondary | ICD-10-CM | POA: Diagnosis not present

## 2015-11-05 DIAGNOSIS — R269 Unspecified abnormalities of gait and mobility: Secondary | ICD-10-CM | POA: Diagnosis not present

## 2015-11-05 HISTORY — DX: Unspecified abnormalities of gait and mobility: R26.9

## 2015-11-05 MED ORDER — PREDNISONE 10 MG PO TABS: ORAL_TABLET | ORAL | Status: DC

## 2015-11-05 NOTE — Telephone Encounter (Signed)
I called pt back.   She will come in 1145 to be evaluated.  States has noted BLE weakness and prog gotten worse.  States can hardly even walk.

## 2015-11-05 NOTE — Telephone Encounter (Signed)
Patient called, states she can't walk and needs to be seen ASAP, please call [phone].

## 2015-11-05 NOTE — Progress Notes (Signed)
Reason for visit: Multiple sclerosis, exacerbation  Toni Weaver is an 25 y.o. female  History of present illness:  Toni Weaver is a 25 year old right-handed black female with a history of multiple sclerosis. The patient has been on Rebif, but over the last 2 or 3 months she has had a gradual change in her ability to ambulate, with significant changes over the last 2 or 3 weeks. The patient has had to walk with her feet wide apart, she will have to hold on the walls in order to get around. She is not using a cane for ambulation. The patient has some left leg numbness on the thigh, and also including the left foot. She denies any changes in control of the bowels or the bladder. She has not had any changes in function of her arms. She has not had any vision changes. She last had MRI brain and cervical evaluation in the fall of 2015. She comes to this office on an urgent basis because of the changes in her functional condition.  Past Medical History  Diagnosis Date  . Multiple sclerosis (HCC) 11/24/2012  . Optic neuritis 11/24/2012  . Abnormality of gait 11/05/2015    Past Surgical History  Procedure Laterality Date  . Appendectomy      Family History  Problem Relation Age of Onset  . Hypertension Mother   . Hypertension Father   . Cancer Maternal Grandmother     Breast cancer    Social history:  reports that she has never smoked. She has never used smokeless tobacco. She reports that she does not drink alcohol or use illicit drugs.   No Known Allergies  Medications:  Prior to Admission medications   Medication Sig Start Date End Date Taking? Authorizing Provider  Multiple Vitamin (MULTIVITAMIN) tablet Take 1 tablet by mouth daily.   Yes Historical Provider, MD  ondansetron (ZOFRAN ODT) 4 MG disintegrating tablet Take 1 tablet (4 mg total) by mouth every 8 (eight) hours as needed for nausea or vomiting. 03/27/15  Yes York Spaniel, MD  REBIF REBIDOSE 44 MCG/0.5ML SOAJ Inject  0.5 mLs (44 mcg total) into the skin 3 times a week. 07/18/15  Yes York Spaniel, MD    ROS:  Out of a complete 14 system review of symptoms, the patient complains only of the following symptoms, and all other reviewed systems are negative.  Heat intolerance Walking difficulty Memory loss  Blood pressure 110/89, pulse 84, height  (1.626 m), weight 149 lb (67.586 kg).  Physical Exam  General: The patient is alert and cooperative at the time of the examination.  Skin: No significant peripheral edema is noted.   Neurologic Exam  Mental status: The patient is alert and oriented x 3 at the time of the examination. The patient has apparent normal recent and remote memory, with an apparently normal attention span and concentration ability.   Cranial nerves: Facial symmetry is present. Speech is normal, no aphasia or dysarthria is noted. Extraocular movements are full. Visual fields are full. Pupils are equal, round, and reactive to light. Some blanching of the discs are seen bilaterally.  Motor: The patient has good strength in all 4 extremities.  Sensory examination: Soft touch sensation is symmetric on the face, arms, and legs.  Coordination: The patient has good finger-nose-finger and heel-to-shin bilaterally.  Gait and station: The patient has a wide-based, unsteady gait. Tandem gait was not attempted. Romberg is negative. No drift is seen.  Reflexes: Deep tendon  reflexes are symmetric, but are brisk throughout. 3 or 4 beats of ankle clonus are noted bilaterally.   Assessment/Plan:  1. Multiple sclerosis with exacerbation  2. Gait disturbance  The patient has developed a significant issue with her walking. The patient has had changes in her functional condition that occurred over the last 2 or 3 months, with acceleration over the last 2 or 3 weeks. She will undergo Solu-Medrol therapy, 500 mg daily for 3 days, with conversion to a prednisone Dosepak afterward. She is  on Rebif, but we will switch her to Gilenya at this time. The patient will undergo blood work today. MRI evaluation of the brain and cervical spinal cord will be done. She will follow-up in 3 months, sooner if needed. I will set her up for physical therapy for her walking. The patient will need to get an ophthalmologic evaluation.  Marlan Palau MD 11/05/2015 1:11 PM  [facility] [address] [place], [place] [address]  Phone [phone] Fax [phone]

## 2015-11-05 NOTE — Patient Instructions (Signed)

## 2015-11-06 ENCOUNTER — Telehealth: Payer: Self-pay | Admitting: *Deleted

## 2015-11-06 LAB — CBC WITH DIFFERENTIAL/PLATELET
BASOS: 1 %
Basophils Absolute: 0.1 10*3/uL (ref 0.0–0.2)
EOS (ABSOLUTE): 0.1 10*3/uL (ref 0.0–0.4)
EOS: 1 %
HEMATOCRIT: 38.3 % (ref 34.0–46.6)
HEMOGLOBIN: 13.1 g/dL (ref 11.1–15.9)
IMMATURE GRANULOCYTES: 0 %
Immature Grans (Abs): 0 10*3/uL (ref 0.0–0.1)
Lymphocytes Absolute: 2 10*3/uL (ref 0.7–3.1)
Lymphs: 30 %
MCH: 28.1 pg (ref 26.6–33.0)
MCHC: 34.2 g/dL (ref 31.5–35.7)
MCV: 82 fL (ref 79–97)
MONOCYTES: 7 %
MONOS ABS: 0.5 10*3/uL (ref 0.1–0.9)
NEUTROS PCT: 61 %
Neutrophils Absolute: 4 10*3/uL (ref 1.4–7.0)
Platelets: 356 10*3/uL (ref 150–379)
RBC: 4.66 x10E6/uL (ref 3.77–5.28)
RDW: 14.2 % (ref 12.3–15.4)
WBC: 6.6 10*3/uL (ref 3.4–10.8)

## 2015-11-06 LAB — COMPREHENSIVE METABOLIC PANEL
ALBUMIN: 4.5 g/dL (ref 3.5–5.5)
ALT: 18 IU/L (ref 0–32)
AST: 16 IU/L (ref 0–40)
Albumin/Globulin Ratio: 1.5 (ref 1.2–2.2)
Alkaline Phosphatase: 60 IU/L (ref 39–117)
BUN/Creatinine Ratio: 24 — ABNORMAL HIGH (ref 9–23)
BUN: 16 mg/dL (ref 6–20)
Bilirubin Total: 0.3 mg/dL (ref 0.0–1.2)
CALCIUM: 10.1 mg/dL (ref 8.7–10.2)
CO2: 26 mmol/L (ref 18–29)
CREATININE: 0.68 mg/dL (ref 0.57–1.00)
Chloride: 100 mmol/L (ref 96–106)
GFR calc Af Amer: 141 mL/min/{1.73_m2} (ref >59)
GFR, EST NON AFRICAN AMERICAN: 122 mL/min/{1.73_m2} (ref >59)
GLOBULIN, TOTAL: 3.1 g/dL (ref 1.5–4.5)
Glucose: 90 mg/dL (ref 65–99)
Potassium: 4.6 mmol/L (ref 3.5–5.2)
SODIUM: 141 mmol/L (ref 134–144)
Total Protein: 7.6 g/dL (ref 6.0–8.5)

## 2015-11-06 LAB — VARICELLA ZOSTER ANTIBODY, IGG: Varicella zoster IgG: 2133 index (ref >165)

## 2015-11-06 NOTE — Telephone Encounter (Signed)
-----   Message from York Spaniel, MD sent at 11/06/2015  8:07 AM EDT -----  The blood work results are unremarkable. Please call the patient.  ----- Message -----    From: [facility] Lab Results In Interface    Sent: 11/06/2015   7:42 AM      To: York Spaniel, MD

## 2015-11-06 NOTE — Telephone Encounter (Signed)
Relayed via VM that Labs unremarkable.  She is to call back as needed.

## 2015-11-07 ENCOUNTER — Other Ambulatory Visit: Payer: Self-pay | Admitting: *Deleted

## 2015-11-07 MED ORDER — PREDNISONE 10 MG PO TABS: ORAL_TABLET | ORAL | Status: DC

## 2015-11-09 ENCOUNTER — Telehealth: Payer: Self-pay | Admitting: Neurology

## 2015-11-09 NOTE — Telephone Encounter (Signed)
Pt sts she is interested in [place]. She doesn't have any information about it. She is wanting to know if Dr Anne Hahn thinks this will be a good choice. She has rec'd packet for Gilenya.

## 2015-11-09 NOTE — Telephone Encounter (Signed)
I called the patient. The patient has information on the Gilenya, she is not sure that this is what she wants to do, she wants to consider the Tysabri as she has talked to several people that had tolerated it very well. I will get a revisit to discuss other options of treatment, I think that Ocrevus may also be another option.

## 2015-11-12 ENCOUNTER — Telehealth: Payer: Self-pay | Admitting: Neurology

## 2015-11-12 ENCOUNTER — Ambulatory Visit (INDEPENDENT_AMBULATORY_CARE_PROVIDER_SITE_OTHER): Payer: Self-pay

## 2015-11-12 ENCOUNTER — Ambulatory Visit (INDEPENDENT_AMBULATORY_CARE_PROVIDER_SITE_OTHER): Payer: Managed Care, Other (non HMO)

## 2015-11-12 DIAGNOSIS — G35 Multiple sclerosis: Secondary | ICD-10-CM

## 2015-11-12 DIAGNOSIS — Z0289 Encounter for other administrative examinations: Secondary | ICD-10-CM

## 2015-11-12 DIAGNOSIS — R269 Unspecified abnormalities of gait and mobility: Secondary | ICD-10-CM

## 2015-11-12 NOTE — Telephone Encounter (Signed)
LMVM for pt to return call to set up RV to discuss MS drugs.

## 2015-11-12 NOTE — Telephone Encounter (Signed)
I called patient. MRI of the brain and spinal cord show relative stability from 2015. The patient still has an exacerbation, possibly from a cord lesion in the thoracic area. She wants to rediscuss starting a new medication, coming off of Rebif. We will try to get her in the office soon.   MRI cervical 11/12/2015:  IMPRESSION: This MRI of the cervical spine with and without contrast shows the following: 1. Subtle T2 hyperintense foci within the spinal cord is detailed above. These are better observed on sagittal images that on axial images. None of them enhance and they appeared to be unchanged when compared to the 05/02/2014 MRI. They are consistent with chronic demyelinating plaque associated with multiple sclerosis. 2. There are no significant degenerative changes.   MRI brain 11/12/2015:  IMPRESSION: This MRI of the brain with and without contrast shows the following: 1. Scattered T2/FLAIR hyperintense foci in the posterior fossa and in the cerebral white matter in a pattern and configuration consistent with chronic demyelinating plaque associated with multiple sclerosis. None of the foci enhanced after contrast administration. 2. Compared to the MRI of the brain dated 05/05/2014, there is no interval change. 3. There are no acute findings.

## 2015-11-13 ENCOUNTER — Ambulatory Visit (INDEPENDENT_AMBULATORY_CARE_PROVIDER_SITE_OTHER): Payer: Managed Care, Other (non HMO) | Admitting: Neurology

## 2015-11-13 ENCOUNTER — Encounter: Payer: Self-pay | Admitting: Neurology

## 2015-11-13 VITALS — BP 121/74 | HR 82 | Ht 63.0 in | Wt 146.0 lb

## 2015-11-13 DIAGNOSIS — H469 Unspecified optic neuritis: Secondary | ICD-10-CM | POA: Diagnosis not present

## 2015-11-13 DIAGNOSIS — R269 Unspecified abnormalities of gait and mobility: Secondary | ICD-10-CM | POA: Diagnosis not present

## 2015-11-13 DIAGNOSIS — G35 Multiple sclerosis: Secondary | ICD-10-CM

## 2015-11-13 NOTE — Progress Notes (Addendum)
Reason for visit: Multiple sclerosis  Toni Weaver is an 25 y.o. female  History of present illness:  Toni Weaver is a 25 year old black female, right-handed with a history of multiple sclerosis. The patient has had some worsening of symptoms on Rebif, we are considering switching her to another medication. I initially tried to switch her to Gilenya, but she does not wish to consider this option. She comes in today to discuss other options for treatment. The patient has not had any changes since she was seen last week. The patient is having exacerbation of her ability to ambulate over the last 2 or 3 months, particularly over the last several weeks.   Past Medical History  Diagnosis Date  . Multiple sclerosis (HCC) 11/24/2012  . Optic neuritis 11/24/2012  . Abnormality of gait 11/05/2015    Past Surgical History  Procedure Laterality Date  . Appendectomy      Family History  Problem Relation Age of Onset  . Hypertension Mother   . Hypertension Father   . Cancer Maternal Grandmother     Breast cancer    Social history:  reports that she has never smoked. She has never used smokeless tobacco. She reports that she does not drink alcohol or use illicit drugs.   No Known Allergies  Medications:  Prior to Admission medications   Medication Sig Start Date End Date Taking? Authorizing Provider  Multiple Vitamin (MULTIVITAMIN) tablet Take 1 tablet by mouth daily.    Historical Provider, MD  ondansetron (ZOFRAN ODT) 4 MG disintegrating tablet Take 1 tablet (4 mg total) by mouth every 8 (eight) hours as needed for nausea or vomiting. 03/27/15   York Spaniel, MD  predniSONE (DELTASONE) 10 MG tablet Begin taking 6 tablets daily, taper by one tablet every other day until off the medication. 11/07/15   York Spaniel, MD  REBIF REBIDOSE 44 MCG/0.5ML SOAJ Inject 0.5 mLs (44 mcg total) into the skin 3 times a week. 07/18/15   York Spaniel, MD    ROS:  Out of a complete 14 system  review of symptoms, the patient complains only of the following symptoms, and all other reviewed systems are negative.  Gait disorder  Blood pressure 121/74, pulse 82, height  (1.6 m), weight 146 lb (66.225 kg).  Physical Exam  General: The patient is alert and cooperative at the time of the examination.  Skin: No significant peripheral edema is noted.   Neurologic Exam  Mental status: The patient is alert and oriented x 3 at the time of the examination. The patient has apparent normal recent and remote memory, with an apparently normal attention span and concentration ability.   Cranial nerves: Facial symmetry is present. Speech is normal, no aphasia or dysarthria is noted. Extraocular movements are full. Visual fields are full.  Motor: The patient has good strength in all 4 extremities.  Sensory examination: Soft touch sensation is symmetric on the face, arms, and legs.  Coordination: The patient has good finger-nose-finger and heel-to-shin bilaterally.  Gait and station: The patient has a slightly wide based gait. Romberg is negative. No drift is seen.  Reflexes: Deep tendon reflexes are symmetric, but are brisk.   Assessment/Plan:  1. Multiple sclerosis  2. Gait disorder  I have discussed all the various options for treatment of multiple sclerosis. The patient has decided to initiate treatment with Tysabri. We will check a JC virus antibody panel today. The patient has signed the forms to initiate Tysabri.  She will follow-up in 4 months.  Marlan Palau MD 11/13/2015 7:46 PM  [facility] [address] [place], [place] [address]  Phone [phone] Fax [phone]

## 2015-11-13 NOTE — Telephone Encounter (Signed)
Called and spoke to patient. Offered sooner appt and she has agreed to come in at 3 pm this afternoon.

## 2015-11-15 ENCOUNTER — Ambulatory Visit: Payer: Managed Care, Other (non HMO) | Admitting: Adult Health

## 2015-11-19 ENCOUNTER — Telehealth: Payer: Self-pay | Admitting: Neurology

## 2015-11-19 NOTE — Telephone Encounter (Signed)
Returned [employer] and spoke to [place]. Verified w/ her that pt IS transitioning to new MS medication (Tysabri). Understanding verbalized.

## 2015-11-19 NOTE — Telephone Encounter (Signed)
Chip Boer, Nurse/Gilenya [phone] called to get verbal confirmation that patient is transitioning from Gilenya to another medication.

## 2015-12-03 ENCOUNTER — Telehealth: Payer: Self-pay | Admitting: Neurology

## 2015-12-03 NOTE — Telephone Encounter (Signed)
I called patient. 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 She is scheduled to start the Tysabri tomorrow, I indicated that this is okay to do this, but she cannot continued use of dysarthric indefinitely, we will need to come off the medication in 2 years. She will get the Tysabri treatment tomorrow.

## 2016-01-15 ENCOUNTER — Ambulatory Visit: Payer: Managed Care, Other (non HMO) | Admitting: Adult Health

## 2016-02-21 ENCOUNTER — Ambulatory Visit: Payer: Managed Care, Other (non HMO) | Admitting: Neurology

## 2016-03-13 ENCOUNTER — Telehealth: Payer: Self-pay | Admitting: *Deleted

## 2016-03-13 ENCOUNTER — Ambulatory Visit: Payer: Managed Care, Other (non HMO) | Admitting: Adult Health

## 2016-03-13 NOTE — Telephone Encounter (Signed)
No showed follow up appt. 

## 2016-03-14 ENCOUNTER — Encounter: Payer: Self-pay | Admitting: Adult Health

## 2016-05-13 ENCOUNTER — Telehealth: Payer: Self-pay

## 2016-05-13 NOTE — Telephone Encounter (Signed)
Called pt and offered to schedule appt. Says that she has a balance w/ [facility] that she would like to pay down before she comes in again. 1 yr follow-up scheduled in April. Pt reports that she is doing well on current treatment. Will call back if sooner appt is needed.

## 2016-05-13 NOTE — Telephone Encounter (Signed)
-----   Message from York Spanielharles K Willis, MD sent at 05/13/2016  7:20 AM EDT ----- This patient needs RV soon, OK to see NP.

## 2016-10-02 ENCOUNTER — Emergency Department (HOSPITAL_COMMUNITY)
Admission: EM | Admit: 2016-10-02 | Discharge: 2016-10-02 | Disposition: A | Discharge status: Home / Self Care | Payer: BLUE CROSS/BLUE SHIELD | Attending: Emergency Medicine | Admitting: Emergency Medicine

## 2016-10-02 ENCOUNTER — Encounter (HOSPITAL_COMMUNITY): Payer: Self-pay | Admitting: Emergency Medicine

## 2016-10-02 ENCOUNTER — Emergency Department (HOSPITAL_COMMUNITY): Payer: BLUE CROSS/BLUE SHIELD

## 2016-10-02 DIAGNOSIS — Z79899 Other long term (current) drug therapy: Secondary | ICD-10-CM | POA: Insufficient documentation

## 2016-10-02 DIAGNOSIS — R059 Cough, unspecified: Secondary | ICD-10-CM

## 2016-10-02 DIAGNOSIS — R05 Cough: Secondary | ICD-10-CM | POA: Diagnosis present

## 2016-10-02 DIAGNOSIS — J40 Bronchitis, not specified as acute or chronic: Secondary | ICD-10-CM | POA: Insufficient documentation

## 2016-10-02 IMAGING — CR DG CHEST 2V
2 series · 2 of 2 positions shown · non-contrast
Comparison: None.

CLINICAL DATA: Productive cough

EXAM:
CHEST  2 VIEW

[w chest pa]
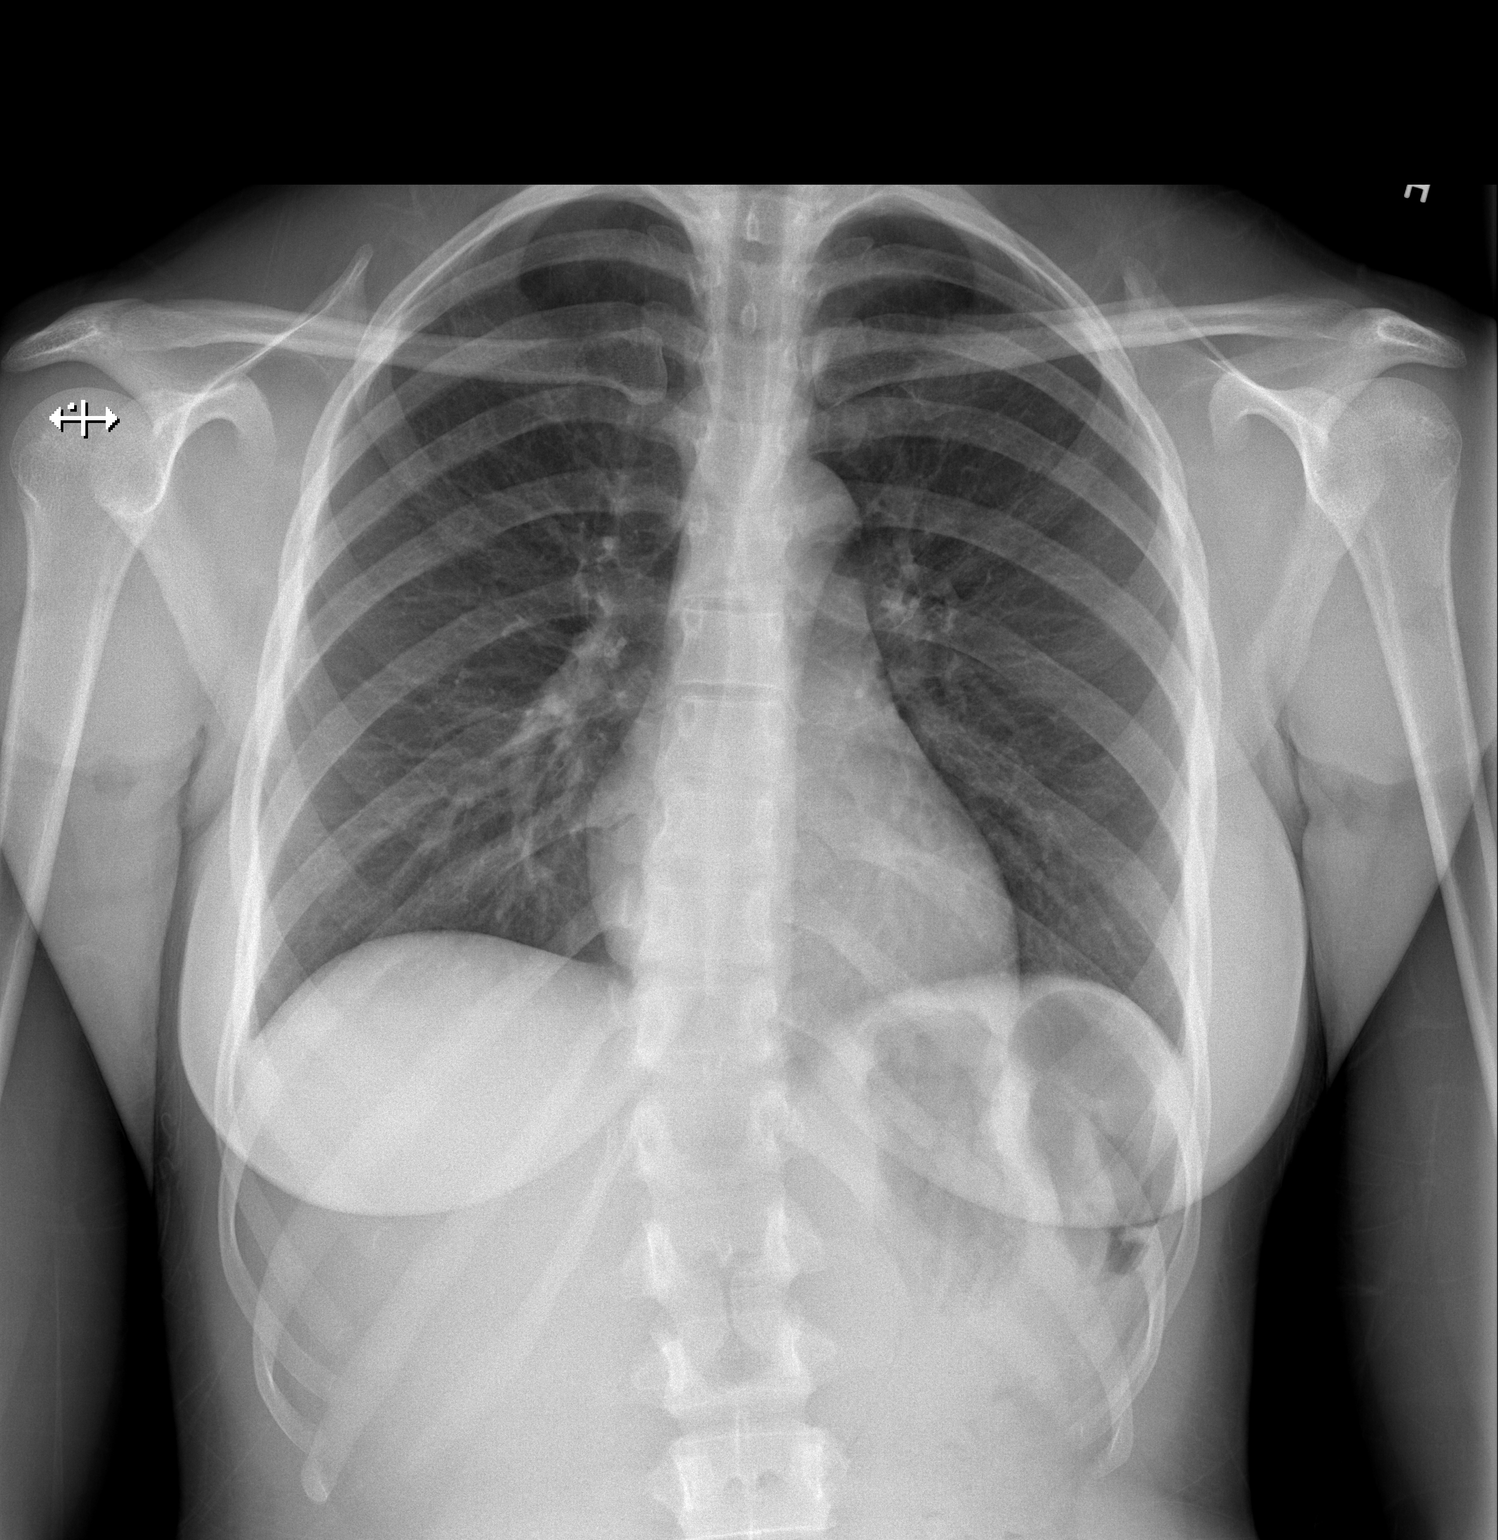

[w chest lat]
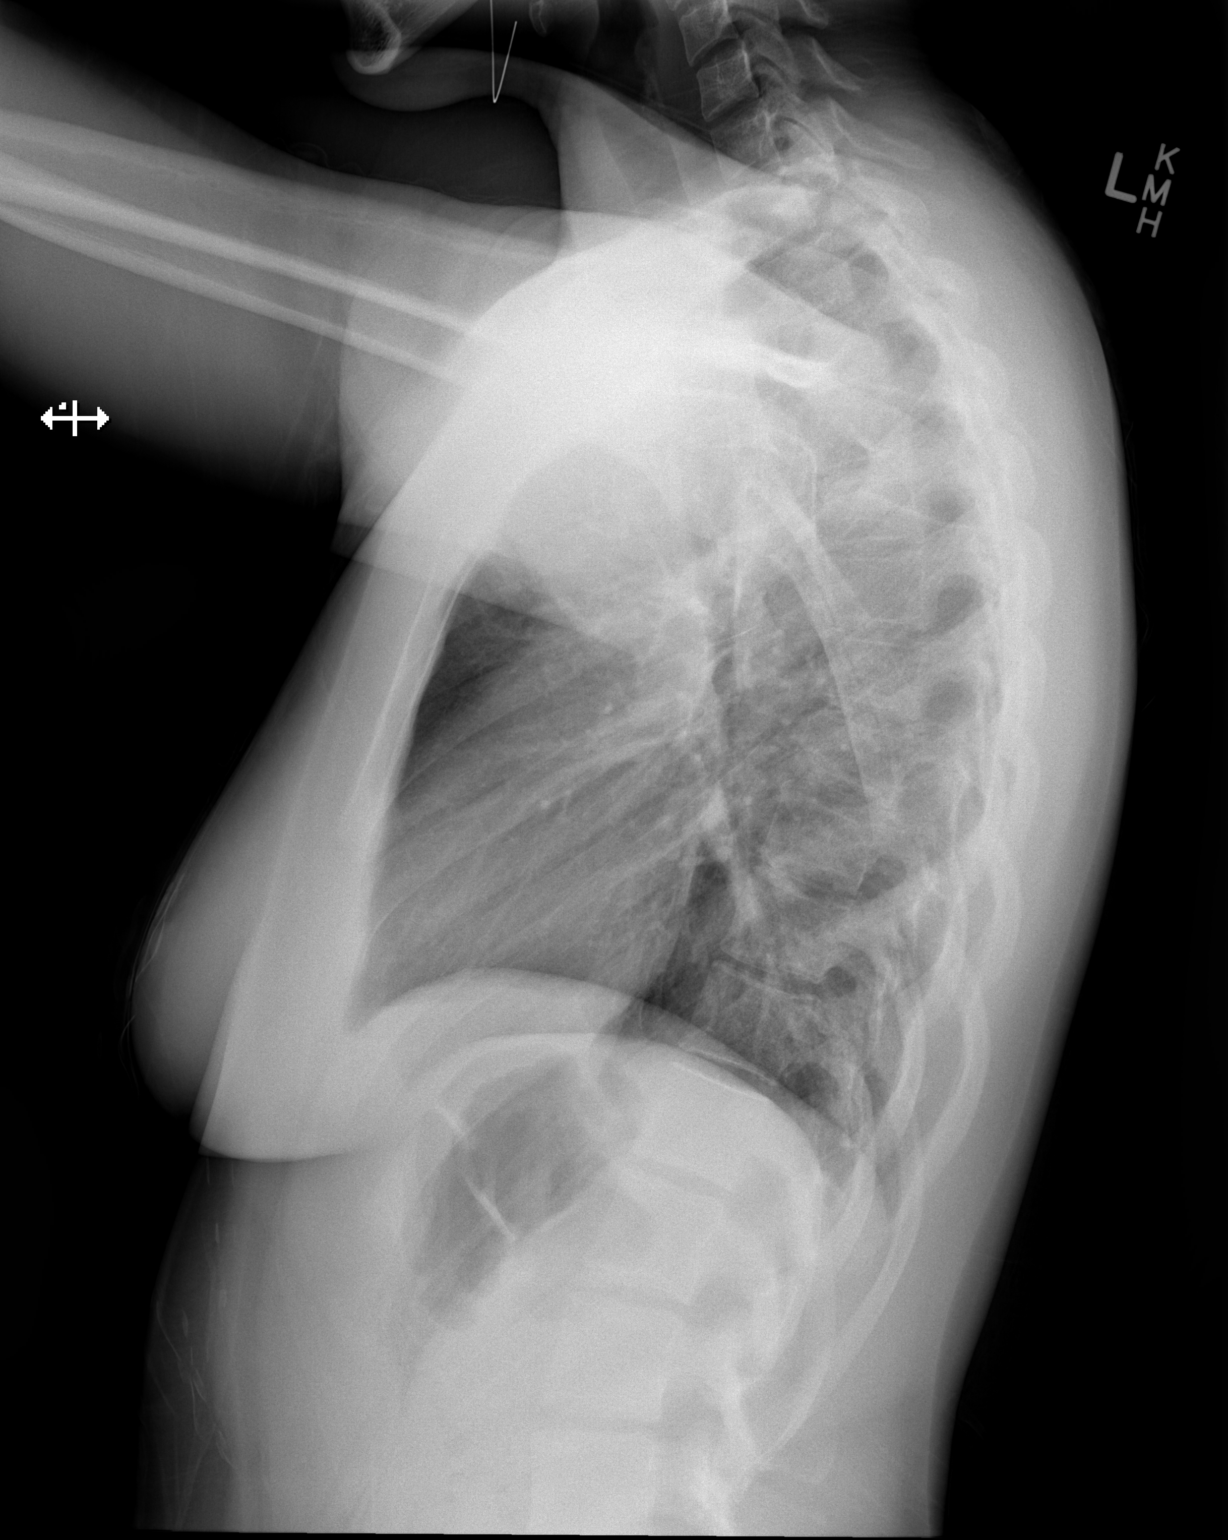

[2 of 2 positions shown; findings below may reference images not displayed]

FINDINGS: The heart size and mediastinal contours are within normal limits.
Both lungs are clear. The visualized skeletal structures are
unremarkable.
IMPRESSION: No active cardiopulmonary disease.

## 2016-10-02 MED ORDER — AZITHROMYCIN 250 MG PO TABS: 250.0000 mg | ORAL_TABLET | Freq: Every day | ORAL | 0 refills | Status: AC

## 2016-10-02 MED ORDER — FLUTICASONE PROPIONATE 50 MCG/ACT NA SUSP: 2.0000 | Freq: Every day | NASAL | 0 refills | Status: DC

## 2016-10-02 MED ORDER — BENZONATATE 100 MG PO CAPS: 100.0000 mg | ORAL_CAPSULE | Freq: Three times a day (TID) | ORAL | 0 refills | Status: DC | PRN

## 2016-10-02 NOTE — ED Provider Notes (Signed)
[facility]-EMERGENCY DEPT Provider Note   CSN: [id number] Arrival date & time: 10/02/16  1204  By signing my name below, I, Toni Weaver, attest that this documentation has been prepared under the direction and in the presence of [name], PA-C. Electronically Signed: Freida Weaver, Scribe. 10/02/2016. 1:19 PM.  History   Chief Complaint Chief Complaint  Patient presents with  . Cough    The history is provided by the patient. No language interpreter was used.   HPI Comments:  Toni Weaver is a 26 y.o. female with a history of MS on Tysabri , who presents to the Emergency Department complaining of a persistent cough  x ~ 6 weeks. Patient states her cough started 6 weeks ago when she thought she had the common cold or the flu, at that time she had other URI symptoms including nasal congestion, headache, runny nose along with the cough.  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  Patient states her cough improved and went away for a few days but returned and has persistent for ~ 2 weeks.  Her cough is productive, "thick", yellow sputum. She has noticed occasional bloody streaking in her sputum. She reports associated mild occasional CP secondary to cough and nasal congestion. No fever, sore throat, nausea, vomiting, leg swelling/calf pain, or SOB. 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 Also, denies h/o PE/DVT.   Past Medical History:  Diagnosis Date  . 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 Multiple sclerosis (HCC) 11/24/2012  . Optic neuritis 11/24/2012    Patient Active Problem List   Diagnosis Date Noted  . 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 Multiple sclerosis (HCC) 11/24/2012  . Optic neuritis 11/24/2012    Past Surgical History:  Procedure Laterality Date  . APPENDECTOMY      OB History    No data available       Home Medications    Prior to Admission medications   Medication Sig Start Date End Date Taking? Authorizing  Provider  azithromycin (ZITHROMAX Z-PAK) 250 MG tablet Take 1 tablet (250 mg total) by mouth daily. PLEASE TAKE 500 MG (2 TABS) ON DAY 1, THEN 250 MG (1 TAB) ON DAY 2, DAY 3, DAY 4 AND DAY 5. 10/02/16 10/08/16  Liberty Handy, PA-C  benzonatate (TESSALON PERLES) 100 MG capsule Take 1 capsule (100 mg total) by mouth 3 (three) times daily as needed for cough. FOR DISRUPTIVE DAY TIME COUGH 10/02/16   Liberty Handy, PA-C  fluticasone [facility]) 50 MCG/ACT nasal spray Place 2 sprays into both nostrils daily. FOR NASAL CONGESTION 10/02/16   Liberty Handy, PA-C  Multiple Vitamin (MULTIVITAMIN) tablet Take 1 tablet by mouth daily.    Historical Provider, MD  ondansetron (ZOFRAN ODT) 4 MG disintegrating tablet Take 1 tablet (4 mg total) by mouth every 8 (eight) hours as needed for nausea or vomiting. 03/27/15   York Spaniel, MD  predniSONE (DELTASONE) 10 MG tablet Begin taking 6 tablets daily, taper by one tablet every other day until off the medication. 11/07/15   York Spaniel, MD  REBIF REBIDOSE 44 MCG/0.5ML SOAJ Inject 0.5 mLs (44 mcg total) into the skin 3 times a week. 07/18/15   York Spaniel, MD    Family History Family History  Problem Relation Age of Onset  . Hypertension Mother   . Hypertension Father   . Cancer Maternal Grandmother     Breast cancer    Social History Social  History  Substance Use Topics  . Smoking status: Never Smoker  . Smokeless tobacco: Never Used  . Alcohol use No     Allergies   Patient has no known allergies.   Review of Systems Review of Systems  Constitutional: Negative for fever.  HENT: Positive for congestion.   Respiratory: Positive for cough. Negative for shortness of breath.   Cardiovascular: Negative for leg swelling.  Gastrointestinal: Negative for nausea and vomiting.  Musculoskeletal: Positive for myalgias.     Physical Exam Updated Vital Signs BP 119/78   Pulse 72   Temp 98.4 F (36.9 C)   Resp 16   LMP 10/02/2016    SpO2 96%   Physical Exam  Constitutional: She is oriented to person, place, and time. She appears well-developed and well-nourished. No distress.  HENT:  Head: Normocephalic and atraumatic.  Right Ear: External ear normal.  Left Ear: External ear normal.  Nose: Mucosal edema present.  Mouth/Throat: Posterior oropharyngeal erythema present. No oropharyngeal exudate.  Oropharynx is slightly erythematous Mild mucosal edema bilaterally   Eyes: Conjunctivae and EOM are normal. Pupils are equal, round, and reactive to light. No scleral icterus.  Neck: Normal range of motion. Neck supple. No JVD present.  Cardiovascular: Normal rate, regular rhythm and normal heart sounds.   No murmur heard. Pulmonary/Chest: Effort normal and breath sounds normal. She has no wheezes.  RR within normal limits. SpO2 within normal limits.  Normal breathing effort. Patient speaking in full sentences. No pursed lip breathing. No chest wall retractions. No cyanosis. Chest wall expansion symmetric.  No chest wall tenderness. Lungs CTAB anteriorly and posteriorly without wheezing, rhonchi or crackles.  No egophony.  No JVD. No orthopnea with head of bed flat.   Abdominal: Soft. There is no tenderness.  Musculoskeletal: Normal range of motion. She exhibits no deformity.  Lymphadenopathy:    She has no cervical adenopathy.  Neurological: She is alert and oriented to person, place, and time.  Skin: Skin is warm and dry. Capillary refill takes less than 2 seconds.  Psychiatric: She has a normal mood and affect. Her behavior is normal. Judgment and thought content normal.  Nursing note and vitals reviewed.    ED Treatments / Results  DIAGNOSTIC STUDIES:  Oxygen Saturation is 96% on RA, normal by my interpretation.    COORDINATION OF CARE:  1:00 PM Discussed treatment plan with pt at bedside and pt agreed to plan.  Labs (all labs ordered are listed, but only abnormal results are displayed) Labs Reviewed - No  data to display  EKG  EKG Interpretation None       Radiology Dg Chest 2 View  Result Date: 10/02/2016 CLINICAL DATA:  Productive cough EXAM: CHEST  2 VIEW COMPARISON:  None. FINDINGS: The heart size and mediastinal contours are within normal limits. Both lungs are clear. The visualized skeletal structures are unremarkable. IMPRESSION: No active cardiopulmonary disease. Electronically Signed   By: Elige Ko   On: 10/02/2016 12:48    Procedures Procedures (including critical care time)  Medications Ordered in ED Medications - No data to display   Initial Impression / Assessment and Plan / ED Course  I have reviewed the triage vital signs and the nursing notes.  Pertinent labs & imaging results that were available during my care of the patient were reviewed by me and considered in my medical decision making (see chart for details).  Clinical Course as of Oct 03 1422  Thu Oct 02, 2016  1253 FINDINGS: The  heart size and mediastinal contours are within normal limits. Both lungs are clear. The visualized skeletal structures are unremarkable. DG Chest 2 View [CG]  1253 afebrile Temp: 98.4 F (36.9 C) [CG]  1253 Pulse Rate: 72 [CG]  1253 Resp: 16 [CG]  1253 SpO2: 96 % [CG]    Clinical Course User Index [CG] Liberty Handy, PA-C   26 year old female with pertinent past medical history of multiple sclerosis on immunosuppressant medicine presents to the emergency department reporting cough associated with nasal congestion 2 weeks. Patient states that she started working around young children last month, soon after that she developed what she thought was the common cold or the flu because she had nasal congestion, cough, headache and sore throat at that time. Patient states that the symptoms resolved. Patient states that her cough had also initially resolved but came back and has been persistent for the last 2 weeks. Patient states her cough is productive of thick, yellow sputum  with occasional scant amount of bloody streaks. No fevers. No exertional chest pain or dyspnea. No signs or symptoms of DVT or PE. On exam patient is nontoxic appearing. Vital signs are reassuring without fever, tachypnea or hypoxia. Lung exam is benign without signs of consolidation. 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 Chest x-ray negative for acute infiltrate at this time. Given patient's past medical history and immunosuppressed state and return off cough after initial resolution I am concerned that there is a bacterial source to her symptoms. Patient will be discharged with antibiotic to cover for potential bacterial infection and symptomatic treatment for her nasal congestion and cough. Patient has reliable follow-up, she was advised to monitor her symptoms and ensure that they are improving with the antibiotics and medicines. Patient is aware of red flag symptoms that would warrant return to the emergency department for further workup. Doubt pulmonary embolism at this time patient does not have any signs or symptoms that warrant further workup for PE.  Final Clinical Impressions(s) / ED Diagnoses   Final diagnoses:  Cough  Bronchitis    New Prescriptions Discharge Medication List as of 10/02/2016  1:23 PM    START taking these medications   Details  azithromycin (ZITHROMAX Z-PAK) 250 MG tablet Take 1 tablet (250 mg total) by mouth daily. PLEASE TAKE 500 MG (2 TABS) ON DAY 1, THEN 250 MG (1 TAB) ON DAY 2, DAY 3, DAY 4 AND DAY 5., Starting Thu 10/02/2016, Until Wed 10/08/2016, Print    benzonatate (TESSALON PERLES) 100 MG capsule Take 1 capsule (100 mg total) by mouth 3 (three) times daily as needed for cough. FOR DISRUPTIVE DAY TIME COUGH, Starting Thu 10/02/2016, Print    fluticasone (FLONASE) 50 MCG/ACT nasal spray Place 2 sprays into both nostrils daily. FOR NASAL CONGESTION, Starting Thu 10/02/2016, Print       I personally  performed the services described in this documentation, which was scribed in my presence. The recorded information has been reviewed and is accurate.     Liberty Handy, PA-C 10/02/16 1424    Azalia Bilis, MD 10/02/16 1729

## 2016-10-02 NOTE — ED Triage Notes (Signed)
Pt states that she has hx of MS and takes immune suppressants.  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 Productive with yellow sputum.  No other complaints.

## 2016-10-02 NOTE — Discharge Instructions (Signed)
Your x-ray did not show pneumonia today.  Given the duration of your symptoms and current immunosuppressed state I'm concerned that you may have a bacterial source to your symptoms. Please take antibiotic as prescribed and until finished. Please use Flonase for nasal congestion. Use albuterol inhaler as needed for cough and to help with chest tightness. You may take a cough suppressant (Tessalon Perles) for disruptive daytime or nighttime cough.  Please return to the emergency department if your symptoms did not improve or if they worsen after completion of your antibiotics or if you develop chest pain, shortness breath or bloody phlegm.

## 2016-11-03 ENCOUNTER — Telehealth: Payer: Self-pay

## 2016-11-03 ENCOUNTER — Ambulatory Visit: Payer: Self-pay | Admitting: Neurology

## 2016-11-03 NOTE — Telephone Encounter (Signed)
Patient did not show to appt today  

## 2016-11-04 ENCOUNTER — Encounter: Payer: Self-pay | Admitting: Neurology

## 2016-11-10 ENCOUNTER — Telehealth: Payer: Self-pay | Admitting: *Deleted

## 2016-11-10 NOTE — Telephone Encounter (Signed)
Called pt. Advised she has a collection balance with [employer] that she has to make a payment on prior to [place] scheduling an appt for her. She missed last two f/u. She also missed her infusion last week.   Transferred call to Angie in billing while patient on the phone.

## 2016-11-10 NOTE — Telephone Encounter (Signed)
  Tried old cell number listed. It was disconnected. Tried phone number on DPR listed for pt, LVM for her to call office.  Called mother number. She stated pt got new cell number: [phone]. I updated this in pt chart.

## 2016-11-10 NOTE — Telephone Encounter (Signed)
Spoke with Angie A in billing and patient made a payment. Scheduled patient for f/u on 11/13/16 at 830am.

## 2016-11-13 ENCOUNTER — Ambulatory Visit (INDEPENDENT_AMBULATORY_CARE_PROVIDER_SITE_OTHER): Payer: BLUE CROSS/BLUE SHIELD | Admitting: Neurology

## 2016-11-13 ENCOUNTER — Encounter (INDEPENDENT_AMBULATORY_CARE_PROVIDER_SITE_OTHER): Payer: Self-pay

## 2016-11-13 ENCOUNTER — Telehealth: Payer: Self-pay | Admitting: *Deleted

## 2016-11-13 ENCOUNTER — Encounter: Payer: Self-pay | Admitting: Neurology

## 2016-11-13 VITALS — BP 129/88 | HR 89 | Ht 63.0 in | Wt 140.0 lb

## 2016-11-13 DIAGNOSIS — G35 Multiple sclerosis: Secondary | ICD-10-CM | POA: Diagnosis not present

## 2016-11-13 DIAGNOSIS — Z5181 Encounter for therapeutic drug level monitoring: Secondary | ICD-10-CM | POA: Diagnosis not present

## 2016-11-13 NOTE — Patient Instructions (Signed)
    We will check MRI of the brain. 

## 2016-11-13 NOTE — Progress Notes (Signed)
Reason for visit: Multiple sclerosis  Toni Weaver is an 26 y.o. female  History of present illness:  Toni Weaver is a 26 year old right-handed black female with a history of multiple sclerosis. The patient has been treated with Tysabri, she is doing quite well on the medication. She tolerates the drug well, she has not had any new symptoms of numbness, weakness, vision changes, bowel or bladder control issues, or balance changes over the last one year. The patient denies any new medical issues that have come up since last seen. The patient has not been good about following up, she has missed 2 prior revisit appointments. She has not been seen in one year. The patient returns for an evaluation.  Past Medical History:  Diagnosis Date  . 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 Multiple sclerosis (HCC) 11/24/2012  . Optic neuritis 11/24/2012    Past Surgical History:  Procedure Laterality Date  . APPENDECTOMY      Family History  Problem Relation Age of Onset  . Hypertension Mother   . Hypertension Father   . Cancer Maternal Grandmother     Breast cancer    Social history:  reports that she has never smoked. She has never used smokeless tobacco. She reports that she does not drink alcohol or use drugs.   No Known Allergies  Medications:  Prior to Admission medications   Medication Sig Start Date End Date Taking? Authorizing Provider  benzonatate (TESSALON PERLES) 100 MG capsule Take 1 capsule (100 mg total) by mouth 3 (three) times daily as needed for cough. FOR DISRUPTIVE DAY TIME COUGH 10/02/16  Yes Liberty Handy, PA-C  fluticasone [facility]) 50 MCG/ACT nasal spray Place 2 sprays into both nostrils daily. FOR NASAL CONGESTION 10/02/16  Yes Liberty Handy, PA-C  Multiple Vitamin (MULTIVITAMIN) tablet Take 1 tablet by mouth daily.   Yes Historical Provider, MD  ondansetron (ZOFRAN ODT) 4 MG disintegrating tablet Take 1 tablet (4 mg total) by mouth every 8 (eight) hours as needed  for nausea or vomiting. 03/27/15  Yes York Spaniel, MD    ROS:  Out of a complete 14 system review of symptoms, the patient complains only of the following symptoms, and all other reviewed systems are negative.  Increased activity  Blood pressure 129/88, pulse 89, height 5\' 3"  (1.6 m), weight 140 lb (63.5 kg).  Physical Exam  General: The patient is alert and cooperative at the time of the examination.  Skin: No significant peripheral edema is noted.   Neurologic Exam  Mental status: The patient is alert and oriented x 3 at the time of the examination. The patient has apparent normal recent and remote memory, with an apparently normal attention span and concentration ability.   Cranial nerves: Facial symmetry is present. Speech is normal, no aphasia or dysarthria is noted. Extraocular movements are full. Visual fields are full. Pupils are equal, round, and reactive to light. Discs are flat bilaterally.  Motor: The patient has good strength in all 4 extremities.  Sensory examination: Soft touch sensation is symmetric on the face, arms, and legs.  Coordination: The patient has good finger-nose-finger and heel-to-shin bilaterally.  Gait and station: The patient has a normal gait. Tandem gait is normal. Romberg is negative. No drift is seen.  Reflexes: Deep tendon reflexes are symmetric.   Assessment/Plan:  1. Multiple sclerosis  The patient remains on Tysabri, she is doing quite well on this. We will have her get blood work today  to include a JC virus antibody panel, she will have a follow-up MRI of the brain with and without gadolinium enhancement. She will follow-up in 6 months. I have stressed to her that she needs to be seen at least twice a year while on Tysabri.  Marlan Palau MD 11/13/2016 8:46 AM  [facility] [address] [place], [place] [address]  Phone [phone] Fax [phone]

## 2016-11-13 NOTE — Telephone Encounter (Signed)
Faxed completed/signed form back to [facility] tysabri patient status report and reauth qudestionnaire. Fax: [phone]. Received confirmation.

## 2016-11-13 NOTE — Telephone Encounter (Signed)
Called and spoke with Dorene Sorrow from [facility]. Scheduled routine lab pick up for JCV. Confirmation number [id number].

## 2016-11-14 LAB — COMPREHENSIVE METABOLIC PANEL
ALBUMIN: 4.4 g/dL (ref 3.5–5.5)
ALT: 20 IU/L (ref 0–32)
AST: 20 IU/L (ref 0–40)
Albumin/Globulin Ratio: 1.6 (ref 1.2–2.2)
Alkaline Phosphatase: 57 IU/L (ref 39–117)
BILIRUBIN TOTAL: 0.5 mg/dL (ref 0.0–1.2)
BUN / CREAT RATIO: 20 (ref 9–23)
BUN: 16 mg/dL (ref 6–20)
CHLORIDE: 101 mmol/L (ref 96–106)
CO2: 25 mmol/L (ref 18–29)
Calcium: 9.7 mg/dL (ref 8.7–10.2)
Creatinine, Ser: 0.79 mg/dL (ref 0.57–1.00)
GFR, EST AFRICAN AMERICAN: 119 mL/min/{1.73_m2} (ref >59)
GFR, EST NON AFRICAN AMERICAN: 104 mL/min/{1.73_m2} (ref >59)
GLOBULIN, TOTAL: 2.7 g/dL (ref 1.5–4.5)
GLUCOSE: 66 mg/dL (ref 65–99)
POTASSIUM: 5 mmol/L (ref 3.5–5.2)
SODIUM: 140 mmol/L (ref 134–144)
Total Protein: 7.1 g/dL (ref 6.0–8.5)

## 2016-11-14 LAB — CBC WITH DIFFERENTIAL/PLATELET
BASOS ABS: 0.1 10*3/uL (ref 0.0–0.2)
BASOS: 1 %
EOS (ABSOLUTE): 0.1 10*3/uL (ref 0.0–0.4)
Eos: 1 %
Hematocrit: 37.7 % (ref 34.0–46.6)
Hemoglobin: 12.5 g/dL (ref 11.1–15.9)
IMMATURE GRANS (ABS): 0 10*3/uL (ref 0.0–0.1)
Immature Granulocytes: 0 %
LYMPHS ABS: 3.7 10*3/uL — AB (ref 0.7–3.1)
LYMPHS: 43 %
MCH: 27.3 pg (ref 26.6–33.0)
MCHC: 33.2 g/dL (ref 31.5–35.7)
MCV: 82 fL (ref 79–97)
MONOS ABS: 1.2 10*3/uL — AB (ref 0.1–0.9)
Monocytes: 14 %
NEUTROS ABS: 3.5 10*3/uL (ref 1.4–7.0)
Neutrophils: 41 %
PLATELETS: 351 10*3/uL (ref 150–379)
RBC: 4.58 x10E6/uL (ref 3.77–5.28)
RDW: 16.3 % — ABNORMAL HIGH (ref 12.3–15.4)
WBC: 8.5 10*3/uL (ref 3.4–10.8)

## 2016-11-18 ENCOUNTER — Telehealth: Payer: Self-pay | Admitting: *Deleted
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
## 2016-11-18 NOTE — Telephone Encounter (Signed)
-----   Message from York Spaniel, MD sent at 11/14/2016  5:28 PM EDT -----  The blood work results are unremarkable. Please call the patient. JC virus antibody is pending.  ----- Message ----- From: Interface, [facility] Lab Results In Sent: 11/14/2016   5:42 AM To: York Spaniel, MD

## 2016-11-24 ENCOUNTER — Telehealth: Payer: Self-pay | Admitting: Neurology

## 2016-11-24 NOTE — Telephone Encounter (Signed)
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 When the patient is seen in office in 6 months, we will need to discuss coming off of Tysabri, going to another medication such as Gilenya.

## 2016-11-25 NOTE — Telephone Encounter (Signed)
Re-faxed updated [facility] patient status report and reauth questionnaire with JCV results. Fax: [phone]. Received confirmation.

## 2016-12-12 ENCOUNTER — Encounter (INDEPENDENT_AMBULATORY_CARE_PROVIDER_SITE_OTHER): Payer: Self-pay

## 2017-01-09 ENCOUNTER — Telehealth: Payer: Self-pay | Admitting: Neurology

## 2017-01-09 DIAGNOSIS — G35 Multiple sclerosis: Secondary | ICD-10-CM

## 2017-01-09 NOTE — Telephone Encounter (Signed)
The patient is here for a Tysabri infusion, she has reported a one-week history of blurring of vision of the left eye. This was a semi-was affected with optic neuritis previously. Patient reports no pain with eye movement, she mainly has blurring of vision, no wash-out of colors. No central scotoma is noted.  The patient has not reported any other symptoms of numbness, weakness, balance changes, or bowel or bladder control problems.  When last seen in April, MRI the brain was ordered, this was never done.  The patient will receive a three-day course of Solu-Medrol taking 500 mg IV daily starting today.  MRI the brain will be reordered.  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

## 2017-01-13 ENCOUNTER — Other Ambulatory Visit: Payer: Self-pay | Admitting: Neurology

## 2017-01-13 MED ORDER — PREDNISONE 10 MG PO TABS: ORAL_TABLET | ORAL | 0 refills | Status: DC

## 2017-01-28 ENCOUNTER — Ambulatory Visit
Admission: RE | Admit: 2017-01-28 | Discharge: 2017-01-28 | Disposition: A | Discharge status: Home / Self Care | Payer: BLUE CROSS/BLUE SHIELD | Source: Ambulatory Visit | Attending: Neurology | Admitting: Neurology

## 2017-01-28 DIAGNOSIS — G35 Multiple sclerosis: Secondary | ICD-10-CM | POA: Diagnosis not present

## 2017-01-28 MED ORDER — GADOBENATE DIMEGLUMINE 529 MG/ML IV SOLN
15.0000 mL | Freq: Once | INTRAVENOUS | Status: AC | PRN
  Administered 2017-01-28: 13 mL via INTRAVENOUS

## 2017-01-31 ENCOUNTER — Telehealth: Payer: Self-pay | Admitting: Neurology

## 2017-01-31 NOTE — Telephone Encounter (Signed)
I called the patient MRI of the brain shows no change from one year ago, the patient has complained of symptoms consistent with a left optic neuritis. She claims that the vision has improved back to normal with use of Solu-Medrol and prednisone. On her next visit, we'll need to consider coming off of Tysabri and going to another medication.   MRI brain 01/29/17:  IMPRESSION:  Abnormal MRI brain (with and without) demonstrating: 1. Multiple round, ovoid, periventricular, subcortical, juxtacortical and posterior fossa chronic demyelinating plaques. 2. No abnormal lesions are seen on post contrast views.   3. No significant change from MRI on 11/12/15.

## 2017-05-11 ENCOUNTER — Telehealth: Payer: Self-pay | Admitting: *Deleted

## 2017-05-11 NOTE — Telephone Encounter (Signed)
Faxed compelted/signed Tysabri re-auth questinnaire back to [employer] at [phone]. Received fax confirmation.

## 2017-05-27 ENCOUNTER — Telehealth: Payer: Self-pay | Admitting: *Deleted

## 2017-05-27 ENCOUNTER — Ambulatory Visit (INDEPENDENT_AMBULATORY_CARE_PROVIDER_SITE_OTHER): Payer: BLUE CROSS/BLUE SHIELD | Admitting: Neurology

## 2017-05-27 ENCOUNTER — Encounter: Payer: Self-pay | Admitting: Neurology

## 2017-05-27 VITALS — BP 116/67 | HR 73 | Ht 63.0 in | Wt 136.0 lb

## 2017-05-27 DIAGNOSIS — Z5181 Encounter for therapeutic drug level monitoring: Secondary | ICD-10-CM | POA: Diagnosis not present

## 2017-05-27 DIAGNOSIS — G35 Multiple sclerosis: Secondary | ICD-10-CM | POA: Diagnosis not present

## 2017-05-27 DIAGNOSIS — R269 Unspecified abnormalities of gait and mobility: Secondary | ICD-10-CM

## 2017-05-27 NOTE — Telephone Encounter (Signed)
Placed JCV lab in lock box for routine pick up by [facility].

## 2017-05-27 NOTE — Progress Notes (Signed)
Reason for visit: Multiple sclerosis  Toni Weaver is an 26 y.o. female  History of present illness:  Toni Weaver is a 26 year old right-handed black female with a history of multiple sclerosis. The patient has had a prior optic neuritis and has a history of a mild gait disorder. The patient has been on Tysabri, she is tolerating the medication quite well and it appears to have resulted in good stability of her multiple sclerosis. Unfortunately, her JC viral antibody panel came back positive in low titer on the last check. The patient returns for further evaluation at this time. She reports no new symptoms of numbness, weakness or any change in bowel or bladder control. She denies any new visual changes. She has good energy level.  Past Medical History:  Diagnosis Date  . 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 Multiple sclerosis (HCC) 11/24/2012  . Optic neuritis 11/24/2012    Past Surgical History:  Procedure Laterality Date  . APPENDECTOMY      Family History  Problem Relation Age of Onset  . Hypertension Mother   . Hypertension Father   . Cancer Maternal Grandmother        Breast cancer    Social history:  reports that she has never smoked. She has never used smokeless tobacco. She reports that she does not drink alcohol or use drugs.   No Known Allergies  Medications:  Prior to Admission medications   Medication Sig Start Date End Date Taking? Authorizing Provider  fluticasone (FLONASE) 50 MCG/ACT nasal spray Place 2 sprays into both nostrils daily. FOR NASAL CONGESTION 10/02/16  Yes Liberty HandyGibbons, Claudia J, PA-C  Multiple Vitamin (MULTIVITAMIN) tablet Take 1 tablet by mouth daily.   Yes [provider]  Natalizumab (TYSABRI IV) Inject into the vein.   Yes [provider]  ondansetron (ZOFRAN ODT) 4 MG disintegrating tablet Take 1 tablet (4 mg total) by mouth every 8 (eight) hours as needed for nausea or vomiting. 03/27/15  Yes York SpanielWillis, Charles K, MD     ROS:  Out of a complete 14 system review of symptoms, the patient complains only of the following symptoms, and all other reviewed systems are negative.  Mild gait instability  Blood pressure 116/67, pulse 73, height 5\' 3"  (1.6 m), weight 136 lb (61.7 kg).  Physical Exam  General: The patient is alert and cooperative at the time of the examination.  Skin: No significant peripheral edema is noted.   Neurologic Exam  Mental status: The patient is alert and oriented x 3 at the time of the examination. The patient has apparent normal recent and remote memory, with an apparently normal attention span and concentration ability.   Cranial nerves: Facial symmetry is present. Speech is normal, no aphasia or dysarthria is noted. Extraocular movements are full. Visual fields are full. Pupils are equal, round, and reactive to light. Discs are flat bilaterally, some disc atrophy is seen bilaterally.  Motor: The patient has good strength in all 4 extremities.  Sensory examination: Soft touch sensation is symmetric on the face, arms, and legs.  Coordination: The patient has good finger-nose-finger and heel-to-shin bilaterally.  Gait and station: The patient has a normal gait. Tandem gait is very minimally unsteady. Romberg is negative. No drift is seen.  Reflexes: Deep tendon reflexes are symmetric.   MRI brain 01/29/17:  IMPRESSION:  Abnormal MRI brain (with and without) demonstrating: 1. Multiple round, ovoid, periventricular, subcortical, juxtacortical and posterior fossa chronic demyelinating plaques. 2. No abnormal lesions are  seen on post contrast views.  3. No significant change from MRI on 11/12/15.   * MRI scan images were reviewed online. I agree with the written report.   Assessment/Plan:  1. Multiple sclerosis  The patient has a positive JC virus antibody panel, I have recommended that she come off of Tysabri and consider a switch to Gilenya. I have given her  information regarding Gilenya, blood work will be done today. The patient will contact our office when she makes decision on what she wishes to do. She is quite hesitant to come off of Tysabri as she has tolerated this well and it has worked well for her multiple sclerosis. She will otherwise follow-up in 6 months.  Marlan Palau MD 05/27/2017 8:10 AM  [facility] [address] [place], [place] [address]  Phone [phone] Fax [phone]

## 2017-05-27 NOTE — Patient Instructions (Signed)
   We will consider the use of Gilenya.

## 2017-05-28 LAB — COMPREHENSIVE METABOLIC PANEL
A/G RATIO: 1.8 (ref 1.2–2.2)
ALT: 18 IU/L (ref 0–32)
AST: 18 IU/L (ref 0–40)
Albumin: 4.6 g/dL (ref 3.5–5.5)
Alkaline Phosphatase: 62 IU/L (ref 39–117)
BILIRUBIN TOTAL: 0.3 mg/dL (ref 0.0–1.2)
BUN/Creatinine Ratio: 22 (ref 9–23)
BUN: 15 mg/dL (ref 6–20)
CALCIUM: 9.6 mg/dL (ref 8.7–10.2)
CO2: 24 mmol/L (ref 20–29)
Chloride: 103 mmol/L (ref 96–106)
Creatinine, Ser: 0.69 mg/dL (ref 0.57–1.00)
GFR, EST AFRICAN AMERICAN: 138 mL/min/{1.73_m2} (ref >59)
GFR, EST NON AFRICAN AMERICAN: 120 mL/min/{1.73_m2} (ref >59)
GLOBULIN, TOTAL: 2.5 g/dL (ref 1.5–4.5)
Glucose: 80 mg/dL (ref 65–99)
POTASSIUM: 4.2 mmol/L (ref 3.5–5.2)
SODIUM: 142 mmol/L (ref 134–144)
Total Protein: 7.1 g/dL (ref 6.0–8.5)

## 2017-05-28 LAB — CBC WITH DIFFERENTIAL/PLATELET
BASOS: 1 %
Basophils Absolute: 0.1 10*3/uL (ref 0.0–0.2)
EOS (ABSOLUTE): 0.1 10*3/uL (ref 0.0–0.4)
Eos: 1 %
Hematocrit: 37.5 % (ref 34.0–46.6)
Hemoglobin: 12.8 g/dL (ref 11.1–15.9)
Immature Grans (Abs): 0 10*3/uL (ref 0.0–0.1)
Immature Granulocytes: 0 %
Lymphocytes Absolute: 3.9 10*3/uL — ABNORMAL HIGH (ref 0.7–3.1)
Lymphs: 46 %
MCH: 28.1 pg (ref 26.6–33.0)
MCHC: 34.1 g/dL (ref 31.5–35.7)
MCV: 82 fL (ref 79–97)
MONOS ABS: 0.9 10*3/uL (ref 0.1–0.9)
Monocytes: 10 %
NEUTROS ABS: 3.5 10*3/uL (ref 1.4–7.0)
NEUTROS PCT: 42 %
PLATELETS: 316 10*3/uL (ref 150–379)
RBC: 4.56 x10E6/uL (ref 3.77–5.28)
RDW: 15 % (ref 12.3–15.4)
WBC: 8.4 10*3/uL (ref 3.4–10.8)

## 2017-05-28 LAB — VARICELLA ZOSTER ANTIBODY, IGG: Varicella zoster IgG: 1325 index (ref >165)

## 2017-06-17 ENCOUNTER — Telehealth: Payer: Self-pay | Admitting: *Deleted

## 2017-06-17 DIAGNOSIS — Z5181 Encounter for therapeutic drug level monitoring: Secondary | ICD-10-CM

## 2017-06-17 NOTE — Telephone Encounter (Signed)
Late entry: Called [facility] yesterday and requested JCV results to be faxed to [phone].   Called again today, have not received results yet. Spoke with Mikle Bosworth. He states results faxed to our office on 10/30. Advised we never received that copy either. He states test "unable to report. Repeat analysis of this specimen yielded inconsistent or unacceptable results."  She states pt needs to have repeat lab draw. She will not be charged.  Received results via fax. Gave to CW,MD to review.  I called and LVM for pt letting her know she needs to come back in to have lab redrawn. There was an error with processing lab via [facility]. Gave hours. Apologized for any inconvenience.  Placed new future order in Epic for JCV lab.

## 2017-06-24 ENCOUNTER — Telehealth: Payer: Self-pay | Admitting: *Deleted

## 2017-06-24 NOTE — Telephone Encounter (Signed)
Pt came today for JCV lab redraw. Placed lab in [facility] lock box for routine pick up.

## 2017-07-06 ENCOUNTER — Telehealth: Payer: Self-pay | Admitting: Neurology

## 2017-07-06 NOTE — Telephone Encounter (Signed)
I called the patient again, left a message again.  If the issue with the hand continues, the patient can always call me back.  The description sounded like Raynaud's phenomenon.

## 2017-07-06 NOTE — Telephone Encounter (Signed)
Pt called she has new onset of rt hand numbness, middle and pinky are yellow, all other finger tips, she just noticed it this morning. She did notice the rt hand numb during last Monday night but she thought it was bc she had a hair band around her wrist. Please call to advise.

## 2017-07-06 NOTE — Telephone Encounter (Signed)
Received JCV lab results from [facility] via fax. Index value: 0.49, JCV antibody: positive.  Gave to CW,MD for review.

## 2017-07-06 NOTE — Telephone Encounter (Signed)
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

## 2017-07-06 NOTE — Telephone Encounter (Signed)
I called the patient.  If the patient is having numbness associated with blanching of the fingers, this may be Raynolds phenomenon, she will need to get her hand down into warm water to see if this will resolve.  If there is discoloration of the fingers associated with the numbness, this is not likely related to multiple sclerosis.  I left a message, I will try to call back later.

## 2017-11-11 ENCOUNTER — Telehealth: Payer: Self-pay | Admitting: *Deleted

## 2017-11-11 NOTE — Telephone Encounter (Signed)
Faxed completed/signed Tysabri re-auth form back to [employer] [facility] prescribing program at [phone]. Received fax confirmation.   

## 2017-11-11 NOTE — Telephone Encounter (Addendum)
Received fax that pt re-atuthorized 12/05/2017-06/05/2018. Pt enrollment#: [id number]. Account: [facility]. Site Auth#: [id number].

## 2017-11-24 ENCOUNTER — Telehealth: Payer: Self-pay | Admitting: Neurology

## 2017-11-24 NOTE — Telephone Encounter (Addendum)
Checked with [facility]. Pt last received tysabri infusion 07/24/17. Made [facility] aware she has lost her insurance. Pt needs to be enrolled in [employer] free drug program. She has already be re-authorized through [place] [facility] prescribing program. Valid from  12/05/17-06/05/2018. Gave information to [facility] to start this process. [facility] stated they will contact patient.

## 2017-11-24 NOTE — Telephone Encounter (Signed)
Pt c/a follow up appt for 4/24. Pt states she is in between jobs and starts a new job next month. Pt does not have insurance at this time and is unsure if it is 30 day or 60 days when it will be effective. She is wanting to know if she will be able to continue the tysabri treatment. A follow up appt not was r/s since she is unaware of the start date of the insurance but will call back when she knows to r/s. Pt is requesting a call back reg tysabri treatment. Please call to advise

## 2017-11-25 ENCOUNTER — Ambulatory Visit: Payer: BLUE CROSS/BLUE SHIELD | Admitting: Neurology

## 2017-12-27 ENCOUNTER — Ambulatory Visit (HOSPITAL_COMMUNITY)
Admission: EM | Admit: 2017-12-27 | Discharge: 2017-12-27 | Disposition: A | Discharge status: Home / Self Care | Payer: BLUE CROSS/BLUE SHIELD | Attending: Physician Assistant | Admitting: Physician Assistant

## 2017-12-27 ENCOUNTER — Other Ambulatory Visit: Payer: Self-pay

## 2017-12-27 ENCOUNTER — Encounter (HOSPITAL_COMMUNITY): Payer: Self-pay | Admitting: Emergency Medicine

## 2017-12-27 DIAGNOSIS — B029 Zoster without complications: Secondary | ICD-10-CM

## 2017-12-27 MED ORDER — VALACYCLOVIR HCL 1 G PO TABS: 1000.0000 mg | ORAL_TABLET | Freq: Three times a day (TID) | ORAL | 0 refills | Status: AC

## 2017-12-27 NOTE — ED Provider Notes (Signed)
12/27/2017 6:16 PM   DOB: 09-Oct-1990 / MRN: [id number]  SUBJECTIVE:  Toni Weaver is a 27 y.o. female presenting for rash that started 5 days ago about left V1.  She associates pain and itching.  She has had chicken pox and takes immunosuppressives for MS. She denies eye pain and vision changes.  She has No Known Allergies.   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    She  reports that she has never smoked. She has never used smokeless tobacco. She reports that she does not drink alcohol or use drugs. She  has no sexual activity history on file. The patient  has a past surgical history that includes Appendectomy.  Her family history includes Cancer in her maternal grandmother; Hypertension in her father and mother.  Review of Systems  Eyes: Negative for blurred vision, double vision, photophobia, pain, discharge and redness.  Skin: Positive for itching and rash.  Neurological: Negative for dizziness and headaches.    OBJECTIVE:  BP 121/80 (BP Location: Left Arm)   Pulse 76   Temp 98.5 F (36.9 C) (Oral)   Resp 18   LMP 12/06/2017   SpO2 100%   Wt Readings from Last 3 Encounters:  05/27/17 136 lb (61.7 kg)  11/13/16 140 lb (63.5 kg)  11/13/15 146 lb (66.2 kg)   Temp Readings from Last 3 Encounters:  12/27/17 98.5 F (36.9 C) (Oral)  10/02/16 98.4 F (36.9 C)  04/15/13 97.8 F (36.6 C) (Oral)   BP Readings from Last 3 Encounters:  12/27/17 121/80  05/27/17 116/67  11/13/16 129/88   Pulse Readings from Last 3 Encounters:  12/27/17 76  05/27/17 73  11/13/16 89    Physical Exam  HENT:  Head:      No results found for this or any previous visit (from the past 72 hour(s)).  No results found.  ASSESSMENT AND PLAN:   Herpes zoster without complication: She is late in the course and lucky this has not gone to her eye.  I am started valtrex today.      Discharge Instructions     Start the valtrex today and try not to miss doses.  Try to keep the eruptions out of direct sunlight.  There will be no scarring with this. If at any point your vision changes then please go to the ED.         The patient is advised to call or return to clinic if she does not see an improvement in symptoms, or to seek the care of the closest emergency department if she worsens with the above plan.   Deliah Boston, MHS, PA-C 12/27/2017 6:16 PM   Ofilia Neas, PA-C 12/27/17 1817

## 2017-12-27 NOTE — Discharge Instructions (Signed)
Start the valtrex today and try not to miss doses.  Try to keep the eruptions out of direct sunlight.  There will be no scarring with this. If at any point your vision changes then please go to the ED.

## 2017-12-27 NOTE — ED Triage Notes (Signed)
Rash to left side of face.  Rash appeared on Tuesday.  Rash is limited to left side of face only.

## 2017-12-30 ENCOUNTER — Telehealth: Payer: Self-pay | Admitting: Neurology

## 2017-12-30 NOTE — Telephone Encounter (Signed)
I called the patient.  The patient has sustained a left V1 distribution shingles outbreak, she went to the emergency room on 27 Dec 2017.  She is on Valtrex currently.  She has not had any visual changes with this outbreak.  We will postpone the Tysabri until the active vesicles have disappeared.  The patient does not have a revisit with our office, we will need to get something set up.  She will need blood work done.

## 2017-12-30 NOTE — Telephone Encounter (Signed)
Called pt. Scheduled f/u for 01/12/18 at 730am. Pt verbalized understanding and appreciation.

## 2017-12-30 NOTE — Telephone Encounter (Signed)
Pt called she has been dx with shingles thru Urgent Care on 5/26. She is due for an infusion tomorrow and needs to c/a this. Please call to discuss.

## 2018-01-12 ENCOUNTER — Telehealth: Payer: Self-pay | Admitting: *Deleted

## 2018-01-12 ENCOUNTER — Telehealth: Payer: Self-pay | Admitting: Neurology

## 2018-01-12 ENCOUNTER — Encounter: Payer: Self-pay | Admitting: Neurology

## 2018-01-12 ENCOUNTER — Ambulatory Visit: Payer: Self-pay | Admitting: Neurology

## 2018-01-12 VITALS — BP 116/70 | HR 81 | Ht 63.0 in | Wt 137.0 lb

## 2018-01-12 DIAGNOSIS — Z5181 Encounter for therapeutic drug level monitoring: Secondary | ICD-10-CM

## 2018-01-12 DIAGNOSIS — G35 Multiple sclerosis: Secondary | ICD-10-CM

## 2018-01-12 NOTE — Telephone Encounter (Signed)
Placed JCV lab in [facility] for routine pick up. 

## 2018-01-12 NOTE — Progress Notes (Signed)
Reason for visit: Multiple sclerosis  Toni Weaver is an 27 y.o. female  History of present illness:  Toni Weaver is a 27 year old right-handed black female with a history of multiple sclerosis.  The patient has a mild gait disturbance, she has not had any falls.  The patient reports no new neurologic symptoms with the multiple sclerosis, but she did go to the emergency room on 27 Dec 2017 with an outbreak of shingles in the left V1 distribution.  Herpes zoster antibody levels that were done in October 2018 were adequate.  The patient denies any difficulty controlling the bowels or the bladder, she denies any new numbness or weakness of the face, arms, or legs.  She has not had any visual alterations with exception that she has noted some difficulty focusing on things at times, there is a delay in when she looks at something when she can actually focus on it.  She does wear contacts.  The patient returns to this office for an evaluation.  The patient has not had any pain in the distribution of the shingles outbreak.  There are no active lesions on the skin at this point.  Past Medical History:  Diagnosis Date  . 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 Multiple sclerosis (HCC) 11/24/2012  . Optic neuritis 11/24/2012    Past Surgical History:  Procedure Laterality Date  . APPENDECTOMY      Family History  Problem Relation Age of Onset  . Hypertension Mother   . Hypertension Father   . Cancer Maternal Grandmother        Breast cancer    Social history:  reports that she has never smoked. She has never used smokeless tobacco. She reports that she does not drink alcohol or use drugs.   No Known Allergies  Medications:  Prior to Admission medications   Medication Sig Start Date End Date Taking? Authorizing Provider  fluticasone (FLONASE) 50 MCG/ACT nasal spray Place 2 sprays into both nostrils daily. FOR NASAL CONGESTION 10/02/16  Yes Liberty Handy, PA-C  Multiple Vitamin  (MULTIVITAMIN) tablet Take 1 tablet by mouth daily.   Yes [provider]  Natalizumab (TYSABRI IV) Inject into the vein.   Yes [provider]  ondansetron (ZOFRAN ODT) 4 MG disintegrating tablet Take 1 tablet (4 mg total) by mouth every 8 (eight) hours as needed for nausea or vomiting. 03/27/15  Yes York Spaniel, MD    ROS:  Out of a complete 14 system review of symptoms, the patient complains only of the following symptoms, and all other reviewed systems are negative.  Shingles  Blood pressure 116/70, pulse 81, height 5\' 3"  (1.6 m), weight 137 lb (62.1 kg).  Physical Exam  General: The patient is alert and cooperative at the time of the examination.  Skin: No significant peripheral edema is noted.   Neurologic Exam  Mental status: The patient is alert and oriented x 3 at the time of the examination. The patient has apparent normal recent and remote memory, with an apparently normal attention span and concentration ability.   Cranial nerves: Facial symmetry is present. Speech is normal, no aphasia or dysarthria is noted. Extraocular movements are full. Visual fields are full.  Pupils are equal, round, and reactive to light.  Discs are flat bilaterally.  Motor: The patient has good strength in all 4 extremities.  Sensory examination: Soft touch sensation is symmetric on the face, arms, and legs.  Coordination: The patient has good finger-nose-finger and  heel-to-shin bilaterally.  Gait and station: The patient has a normal gait. Tandem gait is slightly unsteady. Romberg is negative. No drift is seen.  Reflexes: Deep tendon reflexes are symmetric.   Assessment/Plan:  1.  Multiple sclerosis  2.  Recent shingles outbreak, left V1 distribution  The patient will have blood work done today, she will have MRI of the brain done.  I have discussed the possibility of switching to Gilenya or to Ocrevus, information was given concerning Ocrevus.  The patient does  have a low titer positive JC virus antibody, the patient is quite hesitant to come off of Tysabri.  For now we will continue Tysabri, she will follow-up in 6 months.  If she does decide to come off of this medication, she will contact our office.  Marlan Palau MD 01/12/2018 7:44 AM  [facility] [address] [place], [place] [address]  Phone [phone] Fax [phone]

## 2018-01-12 NOTE — Addendum Note (Signed)
Addended by: Tamera Stands D on: 01/12/2018 08:04 AM   Modules accepted: Orders

## 2018-01-12 NOTE — Telephone Encounter (Signed)
self pay order sent to [facility]. They will contact the pt to schedule.

## 2018-01-12 NOTE — Telephone Encounter (Signed)
Patient was seen today by Dr. Anne Hahn and has a balance on her account. I recommended that she call billing to set up a payment plan before we make her follow up appointment.

## 2018-01-16 LAB — CBC WITH DIFFERENTIAL/PLATELET
BASOS: 1 %
Basophils Absolute: 0.1 10*3/uL (ref 0.0–0.2)
EOS (ABSOLUTE): 0.1 10*3/uL (ref 0.0–0.4)
EOS: 1 %
HEMATOCRIT: 37.9 % (ref 34.0–46.6)
HEMOGLOBIN: 12.6 g/dL (ref 11.1–15.9)
IMMATURE GRANS (ABS): 0 10*3/uL (ref 0.0–0.1)
IMMATURE GRANULOCYTES: 0 %
LYMPHS: 41 %
Lymphocytes Absolute: 3.4 10*3/uL — ABNORMAL HIGH (ref 0.7–3.1)
MCH: 28.4 pg (ref 26.6–33.0)
MCHC: 33.2 g/dL (ref 31.5–35.7)
MCV: 85 fL (ref 79–97)
MONOCYTES: 7 %
Monocytes Absolute: 0.6 10*3/uL (ref 0.1–0.9)
NEUTROS PCT: 50 %
Neutrophils Absolute: 4.2 10*3/uL (ref 1.4–7.0)
Platelets: 334 10*3/uL (ref 150–450)
RBC: 4.44 x10E6/uL (ref 3.77–5.28)
RDW: 16.1 % — AB (ref 12.3–15.4)
WBC: 8.3 10*3/uL (ref 3.4–10.8)

## 2018-01-16 LAB — COMPREHENSIVE METABOLIC PANEL
ALK PHOS: 57 IU/L (ref 39–117)
ALT: 9 IU/L (ref 0–32)
AST: 13 IU/L (ref 0–40)
Albumin/Globulin Ratio: 2 (ref 1.2–2.2)
Albumin: 4.5 g/dL (ref 3.5–5.5)
BUN/Creatinine Ratio: 25 — ABNORMAL HIGH (ref 9–23)
BUN: 19 mg/dL (ref 6–20)
Bilirubin Total: 0.3 mg/dL (ref 0.0–1.2)
CALCIUM: 9.5 mg/dL (ref 8.7–10.2)
CO2: 24 mmol/L (ref 20–29)
CREATININE: 0.77 mg/dL (ref 0.57–1.00)
Chloride: 101 mmol/L (ref 96–106)
GFR calc Af Amer: 122 mL/min/{1.73_m2} (ref >59)
GFR calc non Af Amer: 106 mL/min/{1.73_m2} (ref >59)
GLOBULIN, TOTAL: 2.2 g/dL (ref 1.5–4.5)
GLUCOSE: 74 mg/dL (ref 65–99)
Potassium: 4.4 mmol/L (ref 3.5–5.2)
SODIUM: 138 mmol/L (ref 134–144)
Total Protein: 6.7 g/dL (ref 6.0–8.5)

## 2018-01-16 LAB — HEPATITIS B SURFACE ANTIBODY,QUALITATIVE: Hep B Surface Ab, Qual: REACTIVE

## 2018-01-16 LAB — QUANTIFERON-TB GOLD PLUS
QuantiFERON Mitogen Value: >10 IU/mL
QuantiFERON Nil Value: 0.01 IU/mL
QuantiFERON TB1 Ag Value: 0.04 IU/mL
QuantiFERON TB2 Ag Value: 0.02 IU/mL
QuantiFERON-TB Gold Plus: NEGATIVE

## 2018-01-16 LAB — VARICELLA ZOSTER ANTIBODY, IGG: Varicella zoster IgG: >4000 index (ref >165)

## 2018-01-16 LAB — HEPATITIS C ANTIBODY: Hep C Virus Ab: <0.1 s/co ratio (ref 0.0–0.9)

## 2018-01-16 LAB — HEPATITIS B CORE ANTIBODY, TOTAL: Hep B Core Total Ab: NEGATIVE

## 2018-01-19 ENCOUNTER — Telehealth: Payer: Self-pay | Admitting: Neurology

## 2018-01-19 NOTE — Telephone Encounter (Signed)
JC viral antibody is negative, titer is 0.35.

## 2018-02-01 ENCOUNTER — Telehealth: Payer: Self-pay | Admitting: Neurology

## 2018-02-01 MED ORDER — TIZANIDINE HCL 2 MG PO TABS: 2.0000 mg | ORAL_TABLET | Freq: Three times a day (TID) | ORAL | 3 refills | Status: DC | PRN

## 2018-02-01 NOTE — Telephone Encounter (Signed)
The patient has been reporting some muscle cramps, we will give her a prescription for tizanidine to use if needed.

## 2018-05-03 ENCOUNTER — Telehealth: Payer: Self-pay | Admitting: *Deleted

## 2018-05-03 NOTE — Telephone Encounter (Signed)
Faxed completed/signed Tysabri pt status report and reauth questionnaire to [employer] at [phone]. Received confirmation.  

## 2018-05-03 NOTE — Telephone Encounter (Signed)
Received fax notification from [employer] prescribing program that pt re-authorized 05/03/18-12/05/18. Account: [facility]. Site auth #: [id number]. PT enrollement number: [id number].

## 2018-08-30 ENCOUNTER — Other Ambulatory Visit: Payer: Self-pay | Admitting: Neurology

## 2018-08-30 ENCOUNTER — Telehealth: Payer: Self-pay | Admitting: Neurology

## 2018-08-30 DIAGNOSIS — G35 Multiple sclerosis: Secondary | ICD-10-CM

## 2018-08-30 NOTE — Telephone Encounter (Signed)
medicaid order sent to [facility] i tried to inform patient but it rang and then hung up

## 2018-08-31 ENCOUNTER — Telehealth: Payer: Self-pay

## 2018-08-31 NOTE — Telephone Encounter (Signed)
Attempted to reach the pt and schedule a f/u appt per Dr. Anne Hahn. Pt was not available and did not have a working vm.. Will try again at a later time.

## 2018-09-06 NOTE — Telephone Encounter (Signed)
Regardless of when the MRI is done, this patient is on Tysabri, she must have a revisit, she must have blood work done, need to call her back and get an appointment.

## 2018-09-06 NOTE — Telephone Encounter (Signed)
I contacted the pt in an attempt to schedule a revisit Per. Dr. Anne Hahn request.  Pt states she will schedule a f/u but did not want to schedule with me today. Pt states she wants to have her MRI completed and then schedule f/u. Pt states she will call the office back to schedule once MRI has been completed/reveiwed.

## 2018-09-07 NOTE — Telephone Encounter (Signed)
I contacted the pt and left another vm requesting a call back to schedule a f/u appt.

## 2018-09-07 NOTE — Telephone Encounter (Signed)
I contacted the pt and left a vm requesting the pt to call back and schedule appt. Please schedule once pt returns call.

## 2018-09-08 NOTE — Telephone Encounter (Signed)
Requested a call back from the pt to schedule her appt.

## 2018-09-09 NOTE — Telephone Encounter (Signed)
I was able to reach the pt and schedule f/u pt states only day of the week she can schedule appts would be Monday. Next available work in Monday slot pt was agreeable to was 10/11/18 at 12 pm and check in time.  Appt made.

## 2018-09-13 ENCOUNTER — Telehealth: Payer: Self-pay | Admitting: Neurology

## 2018-09-13 NOTE — Telephone Encounter (Signed)
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 Pt does not need a call back

## 2018-09-13 NOTE — Telephone Encounter (Signed)
Faxed order to [facility].

## 2018-09-29 ENCOUNTER — Telehealth: Payer: Self-pay | Admitting: Neurology

## 2018-09-29 NOTE — Telephone Encounter (Signed)
I have reviewed the MRI of the brain recently done on disc, I compared to a more recent scan done on 29 January 2017.  I see no changes when compared to this most recent scan.  The new parietal lesion noted on this scan in February 2020 occurred before 2018.  I called the patient, left a message regarding the results.    MRI brain 09/28/18:  1.  Numerous supratentorial and infratentorial white matter lesions consistent with the patient's history of demyelinating disease.  There is a new 6 mm lesion in the right parietal subcortical white matter from the 2014 comparison.  Otherwise the lesions appear to be unchanged, no abnormal enhancement to suggest active demyelination.  2.  Unchanged small nonspecific 8 mm left sided parotid gland lesion versus intraparotid lymph node.

## 2018-10-11 ENCOUNTER — Ambulatory Visit: Payer: Managed Care, Other (non HMO) | Admitting: Neurology

## 2018-10-11 ENCOUNTER — Encounter: Payer: Self-pay | Admitting: Neurology

## 2018-10-11 ENCOUNTER — Telehealth: Payer: Self-pay | Admitting: Neurology

## 2018-10-11 VITALS — BP 127/80 | HR 70 | Ht 63.0 in | Wt 148.0 lb

## 2018-10-11 DIAGNOSIS — G35 Multiple sclerosis: Secondary | ICD-10-CM | POA: Diagnosis not present

## 2018-10-11 DIAGNOSIS — Z5181 Encounter for therapeutic drug level monitoring: Secondary | ICD-10-CM | POA: Diagnosis not present

## 2018-10-11 NOTE — Telephone Encounter (Signed)
Patient was transferred to [facility]

## 2018-10-11 NOTE — Progress Notes (Signed)
JCV blood test [facility] in [facility] at [facility].  

## 2018-10-11 NOTE — Progress Notes (Signed)
Reason for visit: Multiple sclerosis  Toni Weaver is an 28 y.o. female  History of present illness:  Toni Weaver is a 28 year old right-handed black female with a history of multiple sclerosis.  The patient has some mild gait instability issues, she has not noted any new symptoms since last seen.  The patient remains on Tysabri, her last JC viral antibody panel in June 2019 was negative.  The patient is tolerating medication well.  She returns for an evaluation.  Past Medical History:  Diagnosis Date  . 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 Multiple sclerosis (HCC) 11/24/2012  . Optic neuritis 11/24/2012    Past Surgical History:  Procedure Laterality Date  . APPENDECTOMY      Family History  Problem Relation Age of Onset  . Hypertension Mother   . Hypertension Father   . Cancer Maternal Grandmother        Breast cancer    Social history:  reports that she has never smoked. She has never used smokeless tobacco. She reports that she does not drink alcohol or use drugs.   No Known Allergies  Medications:  Prior to Admission medications   Medication Sig Start Date End Date Taking? Authorizing Provider  fluticasone (FLONASE) 50 MCG/ACT nasal spray Place 2 sprays into both nostrils daily. FOR NASAL CONGESTION 10/02/16  Yes Toni Handy, PA-C  Multiple Vitamin (MULTIVITAMIN) tablet Take 1 tablet by mouth daily.   Yes Toni Weaver  Natalizumab (TYSABRI IV) Inject into the vein.   Yes Toni Weaver  ondansetron (ZOFRAN ODT) 4 MG disintegrating tablet Take 1 tablet (4 mg total) by mouth every 8 (eight) hours as needed for nausea or vomiting. 03/27/15  Yes Toni Spaniel, Weaver  tiZANidine (ZANAFLEX) 2 MG tablet Take 1 tablet (2 mg total) by mouth every 8 (eight) hours as needed for muscle spasms. 02/01/18  Yes Toni Spaniel, Weaver    ROS:  Out of a complete 14 system review of symptoms, the patient complains only of the following symptoms, and all other  reviewed systems are negative.  Aching muscles, generalized weakness  Blood pressure 127/80, pulse 70, height 5\' 3"  (1.6 m), weight 148 lb (67.1 kg).  Physical Exam  General: The patient is alert and cooperative at the time of the examination.  Skin: No significant peripheral edema is noted.   Neurologic Exam  Mental status: The patient is alert and oriented x 3 at the time of the examination. The patient has apparent normal recent and remote memory, with an apparently normal attention span and concentration ability.   Cranial nerves: Facial symmetry is present. Speech is normal, no aphasia or dysarthria is noted. Extraocular movements are full. Visual fields are full.  Pupils are equal, round, and reactive to light.  Discs are flat bilaterally.  Motor: The patient has good strength in all 4 extremities.  Sensory examination: Soft touch sensation is symmetric on the face, arms, and legs.  Coordination: The patient has good finger-nose-finger and heel-to-shin bilaterally.  Gait and station: The patient has a normal gait. Tandem gait is normal. Romberg is negative. No drift is seen.  Reflexes: Deep tendon reflexes are symmetric.   Assessment/Plan:  1.  Multiple sclerosis  The patient will have blood work done today, her last MRI of the brain appear to be stable from 2018.  The patient will follow-up through this office in about 6 months.  Toni Palau Weaver 10/11/2018 11:45 AM  [facility] [phone]  [address] [place], [place] [address]  Phone [phone] Fax [phone]

## 2018-10-12 LAB — COMPREHENSIVE METABOLIC PANEL
ALBUMIN: 4.6 g/dL (ref 3.9–5.0)
ALK PHOS: 62 IU/L (ref 39–117)
ALT: 15 IU/L (ref 0–32)
AST: 17 IU/L (ref 0–40)
Albumin/Globulin Ratio: 1.8 (ref 1.2–2.2)
BILIRUBIN TOTAL: 0.3 mg/dL (ref 0.0–1.2)
BUN / CREAT RATIO: 22 (ref 9–23)
BUN: 15 mg/dL (ref 6–20)
CO2: 24 mmol/L (ref 20–29)
Calcium: 9.3 mg/dL (ref 8.7–10.2)
Chloride: 104 mmol/L (ref 96–106)
Creatinine, Ser: 0.67 mg/dL (ref 0.57–1.00)
GFR calc Af Amer: 138 mL/min/{1.73_m2} (ref >59)
GFR calc non Af Amer: 120 mL/min/{1.73_m2} (ref >59)
GLUCOSE: 80 mg/dL (ref 65–99)
Globulin, Total: 2.5 g/dL (ref 1.5–4.5)
Potassium: 4.4 mmol/L (ref 3.5–5.2)
Sodium: 141 mmol/L (ref 134–144)
Total Protein: 7.1 g/dL (ref 6.0–8.5)

## 2018-10-12 LAB — CBC WITH DIFFERENTIAL/PLATELET
Basophils Absolute: 0.1 10*3/uL (ref 0.0–0.2)
Basos: 1 %
EOS (ABSOLUTE): 0.2 10*3/uL (ref 0.0–0.4)
EOS: 1 %
HEMATOCRIT: 45.8 % (ref 34.0–46.6)
HEMOGLOBIN: 15.4 g/dL (ref 11.1–15.9)
IMMATURE GRANS (ABS): 0 10*3/uL (ref 0.0–0.1)
Immature Granulocytes: 0 %
LYMPHS ABS: 4.9 10*3/uL — AB (ref 0.7–3.1)
Lymphs: 38 %
MCH: 27.5 pg (ref 26.6–33.0)
MCHC: 33.6 g/dL (ref 31.5–35.7)
MCV: 82 fL (ref 79–97)
MONOCYTES: 7 %
Monocytes Absolute: 0.9 10*3/uL (ref 0.1–0.9)
NEUTROS ABS: 6.6 10*3/uL (ref 1.4–7.0)
Neutrophils: 53 %
Platelets: 246 10*3/uL (ref 150–450)
RBC: 5.61 x10E6/uL — ABNORMAL HIGH (ref 3.77–5.28)
RDW: 14.7 % (ref 11.7–15.4)
WBC: 12.7 10*3/uL — ABNORMAL HIGH (ref 3.4–10.8)

## 2018-10-12 NOTE — Telephone Encounter (Signed)
Pt was transferred to [facility]

## 2018-10-18 ENCOUNTER — Telehealth: Payer: Self-pay | Admitting: Neurology

## 2018-10-18 NOTE — Telephone Encounter (Signed)
JC viral antibody is negative with an index value of 0.37

## 2018-11-02 ENCOUNTER — Telehealth: Payer: Self-pay | Admitting: *Deleted

## 2018-11-02 NOTE — Telephone Encounter (Signed)
Received [employer] reauthorization questionnaire for tysabri, completed, signed and fax confirmation received [phone]. ovf [phone].

## 2018-11-09 ENCOUNTER — Encounter: Payer: Self-pay | Admitting: Neurology

## 2018-11-09 ENCOUNTER — Telehealth: Payer: Self-pay | Admitting: Neurology

## 2018-11-09 NOTE — Telephone Encounter (Addendum)
I contacted the pt. 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 Pt is concerned about this because of the positive covid19 cases within her place of employment  and her weakened immune system due to tysabri infusion.  She is requesting a letter stating she is on a medication that weakens her immune system and puts her a high risk for covid 19.  Pateint is requesting the name of the medication and her MS dx not be included within the letter.  I have formulated a letter and fwd to MD to review and sign off if appropriate.

## 2018-11-09 NOTE — Telephone Encounter (Signed)
The patient is on a medication that will suppress the immune system, I will dictate a letter in this regard.

## 2018-11-09 NOTE — Telephone Encounter (Signed)
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  Pt is asking for a call from RN just as soon as possible

## 2018-11-10 NOTE — Telephone Encounter (Addendum)
Patient is requesting the letter not to state she has MS or the medication name.   Patient also would like the letter to be sent to her via my chart.

## 2018-11-11 ENCOUNTER — Telehealth: Payer: Self-pay

## 2018-11-11 NOTE — Telephone Encounter (Signed)
Medical Exemption Request has been completed and signed by Dr. Anne Hahn.  Form was completed stating the patient is receiving immunosuppressive therapy.  Form given to medical records to process.

## 2018-12-03 NOTE — Telephone Encounter (Signed)
[employer] called in and stated on the form for Tysabri they are needing question G answered correctly. Form expires May 3 advised them office opens back up on May 4 she stated they will accept a call for a verbal  CB# [phone]

## 2018-12-06 ENCOUNTER — Telehealth: Payer: Self-pay

## 2018-12-06 NOTE — Telephone Encounter (Signed)
Received fax from [employer] program stating the pt has been approved for [employer] prescribing program. PA effective 12/06/2018-06/06/2019.  Pt enrollment # [id number].  Site Auth # [id number].

## 2018-12-06 NOTE — Telephone Encounter (Signed)
I contacted Vernadette with [employer]. She needed to verify if the pt had received oralsteroid therapy in the past 6 months. I was able to advise she has not received therapy.

## 2019-01-10 ENCOUNTER — Encounter: Payer: Self-pay | Admitting: Neurology

## 2019-01-13 NOTE — Telephone Encounter (Signed)
Pt would like to know if letter can be released on my chart so that she can print it out, or possibly be mailed out.

## 2019-02-09 ENCOUNTER — Other Ambulatory Visit: Payer: Self-pay | Admitting: Neurology

## 2019-04-17 NOTE — Progress Notes (Signed)
PATIENT: Toni Weaver DOB: 1991-02-07  REASON FOR VISIT: follow up HISTORY FROM: patient  HISTORY OF PRESENT ILLNESS: Today 04/18/19  Toni Weaver is a 28 year old female with history of multiple sclerosis on Tysabri.  Recent JCV antibody was negative with index value of 0.37 in March 2020.  She had MRI of the brain in February 2020 that Dr. Anne HahnWillis reviewed the disc compared to 2018.  There were no changes seen when compared to recent.  The new parietal lesion noted on the scan in February 2020 occurred on 11/21/2016.  She received her Tysabri infusion today.  She indicates she is tolerating well with good benefit.  She does notice that when near time for her infusion, she feels her legs are weaker, her gait is more unsteady.  She does report urinary and bowel urgency.  She denies any changes to her vision. She will take tizanidine in the time leading up to her next infusion. She is overall very pleased with Tysabri.  She works full-time at the hospital in admissions.  She presents today for follow-up unaccompanied.  HISTORY 10/11/2018 Dr. Anne HahnWillis: Toni Weaver is a 28 year old right-handed black female with a history of multiple sclerosis.  The patient has some mild gait instability issues, she has not noted any new symptoms since last seen.  The patient remains on Tysabri, her last JC viral antibody panel in June 2019 was negative.  The patient is tolerating medication well.  She returns for an evaluation.  REVIEW OF SYSTEMS: Out of a complete 14 system review of symptoms, the patient complains only of the following symptoms, and all other reviewed systems are negative.  Weakness, walking difficulty  ALLERGIES: No Known Allergies  HOME MEDICATIONS: Outpatient Medications Prior to Visit  Medication Sig Dispense Refill  . Multiple Vitamin (MULTIVITAMIN) tablet Take 1 tablet by mouth daily.    . Natalizumab (TYSABRI IV) Inject into the vein.    Marland Kitchen. ondansetron (ZOFRAN ODT) 4 MG disintegrating  tablet Take 1 tablet (4 mg total) by mouth every 8 (eight) hours as needed for nausea or vomiting. 30 tablet 1  . tiZANidine (ZANAFLEX) 2 MG tablet TAKE 1 TABLET BY MOUTH EVERY 8 HOURS AS NEEDED FOR MUSCLE SPASM 90 tablet 0  . fluticasone (FLONASE) 50 MCG/ACT nasal spray Place 2 sprays into both nostrils daily. FOR NASAL CONGESTION (Patient not taking: Reported on 04/18/2019) 16 g 0   No facility-administered medications prior to visit.     PAST MEDICAL HISTORY: Past Medical History:  Diagnosis Date  . 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 Multiple sclerosis (HCC) 11/24/2012  . Optic neuritis 11/24/2012    PAST SURGICAL HISTORY: Past Surgical History:  Procedure Laterality Date  . APPENDECTOMY      FAMILY HISTORY: Family History  Problem Relation Age of Onset  . Hypertension Mother   . Hypertension Father   . Cancer Maternal Grandmother        Breast cancer    SOCIAL HISTORY: Social History   Socioeconomic History  . Marital status: Single    Spouse name: Not on file  . Number of children: 0  . Years of education: In college  . Highest education level: Not on file  Occupational History    Employer: OTHER    Comment: Pt. works at [employer]  Social Needs  . Financial resource strain: Not on file  . Food insecurity    Worry: Not on file    Inability: Not on file  . Transportation needs  Medical: Not on file    Non-medical: Not on file  Tobacco Use  . Smoking status: Never Smoker  . Smokeless tobacco: Never Used  Substance and Sexual Activity  . Alcohol use: No  . Drug use: No  . Sexual activity: Not on file  Lifestyle  . Physical activity    Days per week: Not on file    Minutes per session: Not on file  . Stress: Not on file  Relationships  . Social Herbalist on phone: Not on file    Gets together: Not on file    Attends religious service: Not on file    Active member of club or organization: Not on file    Attends meetings of  clubs or organizations: Not on file    Relationship status: Not on file  . Intimate partner violence    Fear of current or ex partner: Not on file    Emotionally abused: Not on file    Physically abused: Not on file    Forced sexual activity: Not on file  Other Topics Concern  . Not on file  Social History Narrative   Lives at home w/ mom.   Patient is right handed.   Rarely drinks caffeine.    PHYSICAL EXAM  Vitals:   04/18/19 1100  BP: 126/80  Pulse: 74  Temp: 98 F (36.7 C)  TempSrc: Oral  Weight: 147 lb 12.8 oz (67 kg)  Height: 5\' 3"  (1.6 m)   Body mass index is 26.18 kg/m.  Generalized: Well developed, in no acute distress   Neurological examination  Mentation: Alert oriented to time, place, history taking. Follows all commands speech and language fluent Cranial nerve II-XII: Pupils were equal round reactive to light. Extraocular movements were full, visual field were full on confrontational test. Facial sensation and strength were normal.  Head turning and shoulder shrug  were normal and symmetric. Motor: The motor testing reveals 5 over 5 strength of all 4 extremities. Good symmetric motor tone is noted throughout.  Sensory: Sensory testing is intact to soft touch on all 4 extremities. No evidence of extinction is noted.  Coordination: Cerebellar testing reveals good finger-nose-finger and heel-to-shin bilaterally.  Gait and station: External rotation of bilateral lower extremities with gait, tandem gait is mildly unsteady, Romberg is mildly positive Reflexes: Deep tendon reflexes are symmetric and normal bilaterally.   DIAGNOSTIC DATA (LABS, IMAGING, TESTING) - I reviewed patient records, labs, notes, testing and imaging myself where available.  Lab Results  Component Value Date   WBC 12.7 (H) 10/11/2018   HGB 15.4 10/11/2018   HCT 45.8 10/11/2018   MCV 82 10/11/2018   PLT 246 10/11/2018      Component Value Date/Time   NA 141 10/11/2018 1209   K 4.4  10/11/2018 1209   CL 104 10/11/2018 1209   CO2 24 10/11/2018 1209   GLUCOSE 80 10/11/2018 1209   BUN 15 10/11/2018 1209   CREATININE 0.67 10/11/2018 1209   CALCIUM 9.3 10/11/2018 1209   PROT 7.1 10/11/2018 1209   ALBUMIN 4.6 10/11/2018 1209   AST 17 10/11/2018 1209   ALT 15 10/11/2018 1209   ALKPHOS 62 10/11/2018 1209   BILITOT 0.3 10/11/2018 1209   GFRNONAA 120 10/11/2018 1209   GFRAA 138 10/11/2018 1209   No results found for: CHOL, HDL, LDLCALC, LDLDIRECT, TRIG, CHOLHDL No results found for: HGBA1C No results found for: VITAMINB12 Lab Results  Component Value Date   TSH 1.710 04/15/2013  MRI cervical 11/12/2015: IMPRESSION: This MRI of the cervical spine with and without contrast shows the following: 1. Subtle T2 hyperintense foci within the spinal cord is detailed above. These are better observed on sagittal images that on axial images. None of them enhance and they appeared to be unchanged when compared to the 05/02/2014 MRI. They are consistent with chronic demyelinating plaque associated with multiple sclerosis. 2. There are no significant degenerative changes.  ASSESSMENT AND PLAN 28 y.o. year old female  has a past medical history of Abnormality of gait (11/05/2015), Multiple sclerosis (HCC) (11/24/2012), and Optic neuritis (11/24/2012). here with:  1.  Multiple sclerosis 2. Gait Abnormality   She remains on Tysabri and feels her condition is stable.  She had lab work done today after her infusion to include, JCV antibody, CBC, CMP.  She had recent MRI of the brain in February 2020.  Dr. Anne Hahn compared the images to 2018, no changes were seen compared to most recent scan.  The new parietal lesion noted in February 2020 occurred before 2018.  I have provided a refill of tizanidine.  She does notice when the time nears for her next infusion she feels her gait is unsteady, and weaker.  In the past, Dr. Anne Hahn has discussed switching to Gilenya or Ocrevus, due to low titer  positive JCV virus antibody, but she has been hesitant to come off Tysabri (most recent JC viral antibody 0.37, negative March 2020).  We may consider MRI of her cervical spine in the future (last 11/12/2015) due to report of gait instability, urinary/bowel urgency.  She will follow-up in 6 months or sooner if needed.  I did advise her symptoms worsen or she develops any symptoms she should let us know.  I spent 25 minutes with the patient. 50% of this time was spent discussing her plan of care.  Margie Ege, AGNP-C, DNP 04/18/2019, 11:12 AM [facility] [address] [place], [place] [address] [phone]

## 2019-04-18 ENCOUNTER — Encounter: Payer: Self-pay | Admitting: Neurology

## 2019-04-18 ENCOUNTER — Other Ambulatory Visit: Payer: Self-pay | Admitting: Neurology

## 2019-04-18 ENCOUNTER — Other Ambulatory Visit: Payer: Self-pay

## 2019-04-18 ENCOUNTER — Ambulatory Visit (INDEPENDENT_AMBULATORY_CARE_PROVIDER_SITE_OTHER): Payer: Managed Care, Other (non HMO) | Admitting: Neurology

## 2019-04-18 VITALS — BP 126/80 | HR 74 | Temp 98.0°F | Ht 63.0 in | Wt 147.8 lb

## 2019-04-18 DIAGNOSIS — Z5181 Encounter for therapeutic drug level monitoring: Secondary | ICD-10-CM | POA: Diagnosis not present

## 2019-04-18 DIAGNOSIS — G35 Multiple sclerosis: Secondary | ICD-10-CM

## 2019-04-18 DIAGNOSIS — R269 Unspecified abnormalities of gait and mobility: Secondary | ICD-10-CM | POA: Diagnosis not present

## 2019-04-18 MED ORDER — TIZANIDINE HCL 2 MG PO TABS: ORAL_TABLET | ORAL | 1 refills | Status: DC

## 2019-04-18 NOTE — Progress Notes (Signed)
I have read the note, and I agree with the clinical assessment and plan.  Delphina Schum K Clayborne Divis   

## 2019-04-18 NOTE — Patient Instructions (Signed)
Continue current medications. Lab work was checked today, including JCV.

## 2019-04-18 NOTE — Progress Notes (Signed)
JCV blood test [facility] in [facility] at [facility].  

## 2019-04-19 LAB — CBC WITH DIFFERENTIAL/PLATELET
Basophils Absolute: 0.1 10*3/uL (ref 0.0–0.2)
Basos: 1 %
EOS (ABSOLUTE): 0.2 10*3/uL (ref 0.0–0.4)
Eos: 2 %
Hematocrit: 34.7 % (ref 34.0–46.6)
Hemoglobin: 12.1 g/dL (ref 11.1–15.9)
Immature Grans (Abs): 0 10*3/uL (ref 0.0–0.1)
Immature Granulocytes: 0 %
Lymphocytes Absolute: 3.9 10*3/uL — ABNORMAL HIGH (ref 0.7–3.1)
Lymphs: 49 %
MCH: 27.8 pg (ref 26.6–33.0)
MCHC: 34.9 g/dL (ref 31.5–35.7)
MCV: 80 fL (ref 79–97)
Monocytes Absolute: 0.9 10*3/uL (ref 0.1–0.9)
Monocytes: 12 %
Neutrophils Absolute: 2.9 10*3/uL (ref 1.4–7.0)
Neutrophils: 36 %
Platelets: 308 10*3/uL (ref 150–450)
RBC: 4.36 x10E6/uL (ref 3.77–5.28)
RDW: 14.1 % (ref 11.7–15.4)
WBC: 8.1 10*3/uL (ref 3.4–10.8)

## 2019-04-19 LAB — COMPREHENSIVE METABOLIC PANEL
ALT: 8 IU/L (ref 0–32)
AST: 10 IU/L (ref 0–40)
Albumin/Globulin Ratio: 2 (ref 1.2–2.2)
Albumin: 4.3 g/dL (ref 3.9–5.0)
Alkaline Phosphatase: 54 IU/L (ref 39–117)
BUN/Creatinine Ratio: 18 (ref 9–23)
BUN: 11 mg/dL (ref 6–20)
Bilirubin Total: 0.2 mg/dL (ref 0.0–1.2)
CO2: 23 mmol/L (ref 20–29)
Calcium: 9.4 mg/dL (ref 8.7–10.2)
Chloride: 107 mmol/L — ABNORMAL HIGH (ref 96–106)
Creatinine, Ser: 0.62 mg/dL (ref 0.57–1.00)
GFR calc Af Amer: 142 mL/min/{1.73_m2} (ref >59)
GFR calc non Af Amer: 123 mL/min/{1.73_m2} (ref >59)
Globulin, Total: 2.1 g/dL (ref 1.5–4.5)
Glucose: 76 mg/dL (ref 65–99)
Potassium: 4.4 mmol/L (ref 3.5–5.2)
Sodium: 142 mmol/L (ref 134–144)
Total Protein: 6.4 g/dL (ref 6.0–8.5)

## 2019-04-26 ENCOUNTER — Telehealth: Payer: Self-pay | Admitting: Neurology

## 2019-04-26 NOTE — Telephone Encounter (Signed)
I called the patient.  The JC viral antibody panel has converted to positive, still very low index value of 0.67.  At some point, the patient will need to stop the Tysabri, and consider a switch over to another medication such as Ocrevus.  This decision can be made on the next visit.

## 2019-05-02 ENCOUNTER — Encounter: Payer: Self-pay | Admitting: Neurology

## 2019-05-11 ENCOUNTER — Telehealth: Payer: Self-pay

## 2019-05-11 NOTE — Telephone Encounter (Signed)
[facility] Tysabri reauthorization has been completed and faxed to [facility]. Fax # [phone].

## 2019-05-16 NOTE — Telephone Encounter (Signed)
Fax received from [facility] prescribing program that pt re-authorized from 05/11/2019-12/06/2019. Patient enrollment number: [id number]. Account: [facility]. Site auth number: [id number].

## 2019-05-17 ENCOUNTER — Encounter: Payer: Self-pay | Admitting: Neurology

## 2019-08-04 ENCOUNTER — Encounter: Payer: Self-pay | Admitting: Neurology

## 2019-10-03 ENCOUNTER — Encounter: Payer: Self-pay | Admitting: Neurology

## 2019-10-17 ENCOUNTER — Encounter: Payer: Self-pay | Admitting: Neurology

## 2019-10-17 ENCOUNTER — Ambulatory Visit: Payer: Managed Care, Other (non HMO) | Admitting: Neurology

## 2019-10-17 ENCOUNTER — Other Ambulatory Visit: Payer: Self-pay

## 2019-10-17 ENCOUNTER — Telehealth: Payer: Self-pay | Admitting: Neurology

## 2019-10-17 VITALS — BP 104/68 | HR 80 | Temp 97.0°F | Ht 63.0 in | Wt 138.0 lb

## 2019-10-17 DIAGNOSIS — G35 Multiple sclerosis: Secondary | ICD-10-CM

## 2019-10-17 NOTE — Progress Notes (Signed)
PATIENT: Toni Weaver DOB: Dec 03, 1990  REASON FOR VISIT: follow up HISTORY FROM: patient  HISTORY OF PRESENT ILLNESS: Today 10/17/19  Toni Weaver is a 29 year old female with history of multiple sclerosis on Tysabri.  She had MRI of the brain in February 2020, Dr. Patrecia Pace reviewed the disc and compared to 2018.  There were no changes seen when compared to recent.  The new parietal lesion noted on the scan February 2020 occurred on 11/21/2016.  Her last JCV antibody converted positive, index 0.67.  She is hesitant to switch off off Tysabri.  She denies any new problems or concerns.  She does feel her legs are some weaker, as result of not being able to work out due to pandemic restrictions.  She has not had any falls.  She has urinary and bowel urgency, no incontinence.  She has not had any falls.  She works full-time in hospital admissions.  She presents today for evaluation unaccompanied.  HISTORY 04/18/2019 SS: Toni Weaver is a 29 year old female with history of multiple sclerosis on Tysabri.  Recent JCV antibody was negative with index value of 0.37 in March 2020.  She had MRI of the brain in February 2020 that Dr. Anne Hahn reviewed the disc compared to 2018.  There were no changes seen when compared to recent.  The new parietal lesion noted on the scan in February 2020 occurred on 11/21/2016.  She received her Tysabri infusion today.  She indicates she is tolerating well with good benefit.  She does notice that when near time for her infusion, she feels her legs are weaker, her gait is more unsteady.  She does report urinary and bowel urgency.  She denies any changes to her vision. She will take tizanidine in the time leading up to her next infusion. She is overall very pleased with Tysabri.  She works full-time at the hospital in admissions.  She presents today for follow-up unaccompanied.   REVIEW OF SYSTEMS: Out of a complete 14 system review of symptoms, the patient complains only of the  following symptoms, and all other reviewed systems are negative.  Weakness  ALLERGIES: No Known Allergies  HOME MEDICATIONS: Outpatient Medications Prior to Visit  Medication Sig Dispense Refill  . fluticasone (FLONASE) 50 MCG/ACT nasal spray Place 2 sprays into both nostrils daily. FOR NASAL CONGESTION 16 g 0  . Multiple Vitamin (MULTIVITAMIN) tablet Take 1 tablet by mouth daily.    . Natalizumab (TYSABRI IV) Inject into the vein.    Marland Kitchen ondansetron (ZOFRAN ODT) 4 MG disintegrating tablet Take 1 tablet (4 mg total) by mouth every 8 (eight) hours as needed for nausea or vomiting. 30 tablet 1  . tiZANidine (ZANAFLEX) 2 MG tablet TAKE 1 TABLET BY MOUTH EVERY 8 HOURS AS NEEDED FOR MUSCLE SPASM 90 tablet 1   No facility-administered medications prior to visit.    PAST MEDICAL HISTORY: Past Medical History:  Diagnosis Date  . 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 Multiple sclerosis (HCC) 11/24/2012  . Optic neuritis 11/24/2012    PAST SURGICAL HISTORY: Past Surgical History:  Procedure Laterality Date  . APPENDECTOMY      FAMILY HISTORY: Family History  Problem Relation Age of Onset  . Hypertension Mother   . Hypertension Father   . Cancer Maternal Grandmother        Breast cancer    SOCIAL HISTORY: Social History   Socioeconomic History  . Marital status: Single    Spouse name: Not on file  . Number  of children: 0  . Years of education: In college  . Highest education level: Not on file  Occupational History    Employer: OTHER    Comment: Pt. works at [employer]  Tobacco Use  . Smoking status: Never Smoker  . Smokeless tobacco: Never Used  Substance and Sexual Activity  . Alcohol use: No  . Drug use: No  . Sexual activity: Not on file  Other Topics Concern  . Not on file  Social History Narrative   Lives at home w/ mom.   Patient is right handed.   Rarely drinks caffeine.   Social Determinants of Health   Financial Resource Strain:   .  Difficulty of Paying Living Expenses:   Food Insecurity:   . Worried About Programme researcher, broadcasting/film/video in the Last Year:   . Barista in the Last Year:   Transportation Needs:   . Freight forwarder (Medical):   Marland Kitchen Lack of Transportation (Non-Medical):   Physical Activity:   . Days of Exercise per Week:   . Minutes of Exercise per Session:   Stress:   . Feeling of Stress :   Social Connections:   . Frequency of Communication with Friends and Family:   . Frequency of Social Gatherings with Friends and Family:   . Attends Religious Services:   . Active Member of Clubs or Organizations:   . Attends Banker Meetings:   Marland Kitchen Marital Status:   Intimate Partner Violence:   . Fear of Current or Ex-Partner:   . Emotionally Abused:   Marland Kitchen Physically Abused:   . Sexually Abused:    PHYSICAL EXAM  Vitals:   10/17/19 1100  BP: 104/68  Pulse: 80  Temp: (!) 97 F (36.1 C)  SpO2: 99%  Weight: 138 lb (62.6 kg)  Height: 5\' 3"  (1.6 m)   Body mass index is 24.45 kg/m.  Generalized: Well developed, in no acute distress   Neurological examination  Mentation: Alert oriented to time, place, history taking. Follows all commands speech and language fluent Cranial nerve II-XII: Pupils were equal round reactive to light. Extraocular movements were full, visual field were full on confrontational test. Facial sensation and strength were normal. Head turning and shoulder shrug were normal and symmetric. Motor: The motor testing reveals 5 over 5 strength of all 4 extremities. Good symmetric motor tone is noted throughout.  Strong grip strength bilaterally. Sensory: Sensory testing is intact to soft touch on all 4 extremities. No evidence of extinction is noted.  Coordination: Cerebellar testing reveals good finger-nose-finger and heel-to-shin bilaterally.  Gait and station: External rotation of bilateral lower extremities with gait, tandem gait is mildly unsteady, Romberg is mildly positive.  Reflexes: Deep tendon reflexes are symmetric, brisk in the knees  DIAGNOSTIC DATA (LABS, IMAGING, TESTING) - I reviewed patient records, labs, notes, testing and imaging myself where available.  Lab Results  Component Value Date   WBC 8.1 04/18/2019   HGB 12.1 04/18/2019   HCT 34.7 04/18/2019   MCV 80 04/18/2019   PLT 308 04/18/2019      Component Value Date/Time   NA 142 04/18/2019 0944   K 4.4 04/18/2019 0944   CL 107 (H) 04/18/2019 0944   CO2 23 04/18/2019 0944   GLUCOSE 76 04/18/2019 0944   BUN 11 04/18/2019 0944   CREATININE 0.62 04/18/2019 0944   CALCIUM 9.4 04/18/2019 0944   PROT 6.4 04/18/2019 0944   ALBUMIN 4.3 04/18/2019 0944   AST  10 04/18/2019 0944   ALT 8 04/18/2019 0944   ALKPHOS 54 04/18/2019 0944   BILITOT 0.2 04/18/2019 0944   GFRNONAA 123 04/18/2019 0944   GFRAA 142 04/18/2019 0944   No results found for: CHOL, HDL, LDLCALC, LDLDIRECT, TRIG, CHOLHDL No results found for: HGBA1C No results found for: VITAMINB12 Lab Results  Component Value Date   TSH 1.710 04/15/2013    ASSESSMENT AND PLAN 29 y.o. year old female  has a past medical history of Abnormality of gait (11/05/2015), Multiple sclerosis ([place]) (11/24/2012), and Optic neuritis (11/24/2012). here with:  1.  Relapsing remitting multiple sclerosis  She is very hesitant to switch off Tysabri, but her recent JCV has converted positive, very low index value of 0.67. She will need to switch therapy due to the risk for PML. We will plan for Ocrevus. I will order lab work prior to switching, along with JCV as she will remain on Tysabri during approval process. I will also order repeat MRI of the brain and cervical spine with and without contrast. I called and talked with the patient after consultation with Dr. Jannifer Franklin, she will come by this week to have blood work done, and sign papers for the switch. She will follow-up in 6 months or sooner if needed.   I spent 25 minutes with the patient. 50% of this time  was spent discussing her plan of care.  Butler Denmark, AGNP-C, DNP 10/17/2019, 11:08 AM [facility] [address], [place], [place] [address] [phone]

## 2019-10-17 NOTE — Patient Instructions (Signed)
Let me talk with Dr. Anne Hahn and I will call you with treatment options, if we need to get blood work then I will have you come back into the office. I will call you today with the update.

## 2019-10-17 NOTE — Progress Notes (Signed)
I have read the note, and I agree with the clinical assessment and plan.  Marine Lezotte K Anacaren Kohan   

## 2019-10-17 NOTE — Telephone Encounter (Signed)
[employer] order sent to GI. They will obtain the auth and reach out to the patient to schedule.  

## 2019-10-24 NOTE — Addendum Note (Signed)
Addended by: Tamera Stands D on: 10/24/2019 10:48 AM   Modules accepted: Orders

## 2019-10-25 ENCOUNTER — Other Ambulatory Visit (INDEPENDENT_AMBULATORY_CARE_PROVIDER_SITE_OTHER): Payer: Self-pay

## 2019-10-25 ENCOUNTER — Other Ambulatory Visit: Payer: Self-pay

## 2019-10-25 ENCOUNTER — Telehealth: Payer: Self-pay | Admitting: *Deleted

## 2019-10-25 DIAGNOSIS — R799 Abnormal finding of blood chemistry, unspecified: Secondary | ICD-10-CM

## 2019-10-25 DIAGNOSIS — G35 Multiple sclerosis: Secondary | ICD-10-CM

## 2019-10-25 DIAGNOSIS — G35D Multiple sclerosis, unspecified: Secondary | ICD-10-CM

## 2019-10-25 DIAGNOSIS — Z79899 Other long term (current) drug therapy: Secondary | ICD-10-CM

## 2019-10-25 DIAGNOSIS — Z0289 Encounter for other administrative examinations: Secondary | ICD-10-CM

## 2019-10-25 NOTE — Telephone Encounter (Signed)
I spoke to the patient. She is on her way to our office for labs. Order for repeat CMP placed in chart w/ cosign request from Margie Ege, NP.

## 2019-10-25 NOTE — Telephone Encounter (Signed)
[facility] calling critical lab elevate high potassium 6.6 (other labs back as well).

## 2019-10-25 NOTE — Telephone Encounter (Addendum)
Left message requesting a call back today.

## 2019-10-25 NOTE — Addendum Note (Signed)
Addended by: Lilla Shook on: 10/25/2019 04:21 PM   Modules accepted: Orders

## 2019-10-25 NOTE — Telephone Encounter (Signed)
Call the patient to come in for a recheck of potassium ASAP. Make sure she is feeling okay, I question if the sample could have been hemolyzed.

## 2019-10-25 NOTE — Telephone Encounter (Signed)
I called pt and LMVM for her to come in for repeat lab on her potassium level that was elevated. 3.5-4.2 , hers was 6.6.  Try to get done today if possible.

## 2019-10-26 ENCOUNTER — Telehealth: Payer: Self-pay | Admitting: *Deleted

## 2019-10-26 LAB — COMPREHENSIVE METABOLIC PANEL
ALT: 13 IU/L (ref 0–32)
ALT: 6 IU/L (ref 0–32)
AST: 44 IU/L — ABNORMAL HIGH (ref 0–40)
AST: 11 IU/L (ref 0–40)
Albumin/Globulin Ratio: 1.9 (ref 1.2–2.2)
Albumin/Globulin Ratio: 2 (ref 1.2–2.2)
Albumin: 4.3 g/dL (ref 3.9–5.0)
Albumin: 4.4 g/dL (ref 3.9–5.0)
Alkaline Phosphatase: 34 IU/L — ABNORMAL LOW (ref 39–117)
Alkaline Phosphatase: 48 IU/L (ref 39–117)
BUN/Creatinine Ratio: 14 (ref 9–23)
BUN/Creatinine Ratio: 17 (ref 9–23)
BUN: 9 mg/dL (ref 6–20)
BUN: 12 mg/dL (ref 6–20)
Bilirubin Total: 0.3 mg/dL (ref 0.0–1.2)
Bilirubin Total: 0.3 mg/dL (ref 0.0–1.2)
CO2: 17 mmol/L — ABNORMAL LOW (ref 20–29)
CO2: 24 mmol/L (ref 20–29)
Calcium: 8.7 mg/dL (ref 8.7–10.2)
Calcium: 9.1 mg/dL (ref 8.7–10.2)
Chloride: 108 mmol/L — ABNORMAL HIGH (ref 96–106)
Chloride: 106 mmol/L (ref 96–106)
Creatinine, Ser: 0.63 mg/dL (ref 0.57–1.00)
Creatinine, Ser: 0.71 mg/dL (ref 0.57–1.00)
GFR calc Af Amer: 140 mL/min/{1.73_m2} (ref >59)
GFR calc Af Amer: 133 mL/min/{1.73_m2} (ref >59)
GFR calc non Af Amer: 122 mL/min/{1.73_m2} (ref >59)
GFR calc non Af Amer: 115 mL/min/{1.73_m2} (ref >59)
Globulin, Total: 2.3 g/dL (ref 1.5–4.5)
Globulin, Total: 2.2 g/dL (ref 1.5–4.5)
Glucose: 71 mg/dL (ref 65–99)
Glucose: 80 mg/dL (ref 65–99)
Potassium: 6.6 mmol/L (ref 3.5–5.2)
Potassium: 4.2 mmol/L (ref 3.5–5.2)
Sodium: 139 mmol/L (ref 134–144)
Sodium: 141 mmol/L (ref 134–144)
Total Protein: 6.6 g/dL (ref 6.0–8.5)
Total Protein: 6.6 g/dL (ref 6.0–8.5)

## 2019-10-26 LAB — QUANTIFERON-TB GOLD PLUS
QuantiFERON Mitogen Value: >10 IU/mL
QuantiFERON Nil Value: 0.02 IU/mL
QuantiFERON TB1 Ag Value: 0.03 IU/mL
QuantiFERON TB2 Ag Value: 0.03 IU/mL
QuantiFERON-TB Gold Plus: NEGATIVE

## 2019-10-26 LAB — CBC WITH DIFFERENTIAL/PLATELET
Basophils Absolute: 0.1 10*3/uL (ref 0.0–0.2)
Basos: 1 %
EOS (ABSOLUTE): 0.1 10*3/uL (ref 0.0–0.4)
Eos: 1 %
Hematocrit: 34.7 % (ref 34.0–46.6)
Hemoglobin: 11.9 g/dL (ref 11.1–15.9)
Immature Grans (Abs): 0 10*3/uL (ref 0.0–0.1)
Immature Granulocytes: 0 %
Lymphocytes Absolute: 4 10*3/uL — ABNORMAL HIGH (ref 0.7–3.1)
Lymphs: 37 %
MCH: 28.1 pg (ref 26.6–33.0)
MCHC: 34.3 g/dL (ref 31.5–35.7)
MCV: 82 fL (ref 79–97)
Monocytes Absolute: 0.9 10*3/uL (ref 0.1–0.9)
Monocytes: 9 %
Neutrophils Absolute: 5.7 10*3/uL (ref 1.4–7.0)
Neutrophils: 52 %
Platelets: 290 10*3/uL (ref 150–450)
RBC: 4.23 x10E6/uL (ref 3.77–5.28)
RDW: 14.3 % (ref 11.7–15.4)
WBC: 10.9 10*3/uL — ABNORMAL HIGH (ref 3.4–10.8)

## 2019-10-26 LAB — HEPATITIS B SURFACE ANTIBODY,QUALITATIVE: Hep B Surface Ab, Qual: REACTIVE

## 2019-10-26 LAB — VARICELLA ZOSTER ANTIBODY, IGG: Varicella zoster IgG: 2580 index (ref >165)

## 2019-10-26 LAB — HEPATITIS C ANTIBODY: Hep C Virus Ab: <0.1 s/co ratio (ref 0.0–0.9)

## 2019-10-26 LAB — HEPATITIS B SURFACE ANTIGEN: Hepatitis B Surface Ag: NEGATIVE

## 2019-10-26 LAB — HIV ANTIBODY (ROUTINE TESTING W REFLEX): HIV Screen 4th Generation wRfx: NONREACTIVE

## 2019-10-26 LAB — HEPATITIS B CORE ANTIBODY, TOTAL: Hep B Core Total Ab: NEGATIVE

## 2019-10-26 NOTE — Telephone Encounter (Signed)
-----   Message from Glean Salvo, NP sent at 10/26/2019  5:54 AM EDT ----- Repeat potassium was completely normal, along with CMP.

## 2019-10-26 NOTE — Telephone Encounter (Signed)
I spoke to the patient. She is aware of her lab results.

## 2019-10-27 ENCOUNTER — Encounter: Payer: Self-pay | Admitting: Neurology

## 2019-10-27 NOTE — Telephone Encounter (Signed)
C[employer] auth: NPR Ref # [id number].

## 2019-10-27 NOTE — Telephone Encounter (Signed)
pt requested [facility] faxed order

## 2019-10-31 ENCOUNTER — Telehealth: Payer: Self-pay | Admitting: *Deleted

## 2019-10-31 NOTE — Telephone Encounter (Signed)
-----   Message from Glean Salvo, NP sent at 10/27/2019  4:12 PM EDT ----- So far labs look good for pre-screen for Ocrevus  Hep c-negative Varicella-Immune Hep B core negative Hep B surface antibody-reactive, consistent with immunity Hep B surface antigen negative Quantiferon negative HIV non-reactive Repeat CMP looked good. Waiting for JCV.

## 2019-11-01 NOTE — Progress Notes (Signed)
JCV antibody POSITIVE INDEX 0.59 10/24/19 sy

## 2019-11-09 ENCOUNTER — Encounter: Payer: Self-pay | Admitting: Neurology

## 2019-11-10 ENCOUNTER — Telehealth: Payer: Self-pay | Admitting: *Deleted

## 2019-11-10 NOTE — Telephone Encounter (Signed)
Forms for ocrevus given to Toni Weaver, in [facility].  MRI due 11-28-19 to have done.

## 2019-11-14 ENCOUNTER — Telehealth: Payer: Self-pay

## 2019-11-14 NOTE — Telephone Encounter (Signed)
PA for Tysabri  Fax to  [facility] at [phone]. Spoke with liana if pt was starting ocrevus. She stated pt was waiting to have MRI and get a second opinion. I spoke with Maralyn Sago NP and she was made aware. She stated to still do PA until pt makes a final decision. Form was signed by Maralyn Sago NP for tysabri. Fax twice and confirmed.

## 2019-11-15 ENCOUNTER — Telehealth: Payer: Self-pay | Admitting: Neurology

## 2019-11-15 NOTE — Telephone Encounter (Signed)
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  Pt is needing to discuss my chart message from 04-07 with Delmer Islam, please call pt at work if calling after 8:00am @ [phone]

## 2019-11-16 ENCOUNTER — Encounter: Payer: Self-pay | Admitting: *Deleted

## 2019-11-16 NOTE — Telephone Encounter (Signed)
Tried to call pt and she picked up but I was on hold and needed to get to another call that came in.

## 2019-11-16 NOTE — Telephone Encounter (Signed)
Letter signed for patient.

## 2019-11-16 NOTE — Telephone Encounter (Signed)
The letter is in your inbox (soon).

## 2019-11-16 NOTE — Telephone Encounter (Addendum)
Notice of patient authorization for [facility] tysabri approve from 11/16/2019 to 06/06/2020.Contact number is [phone].Form given to [facility] [employer].

## 2019-11-17 ENCOUNTER — Telehealth: Payer: Self-pay | Admitting: Neurology

## 2019-11-17 NOTE — Telephone Encounter (Signed)
Paperwork(staus of [employer]) faxed to "[employer]"

## 2019-11-23 NOTE — Telephone Encounter (Signed)
Pt has called to report that the most recent paperwork sent to her employer is not what is needed.  Pt is asking for a call from Sarah, NP or an Print production planner in hopes of having this time sensitive matter resolved. Pt confirmed [phone] to be her # to call.

## 2019-11-24 NOTE — Telephone Encounter (Signed)
I called LMVM for amanda with [employer] [facility] to return call.

## 2019-11-24 NOTE — Telephone Encounter (Signed)
Redid letter with the statement, [place] is to stay away from confirmed covid positive patients.

## 2019-11-24 NOTE — Telephone Encounter (Signed)
Received notice from pt that work needed another note with statement that pt is to stay away from confirmed covid positive pts.  Redid letter and faxed to [employer] at [phone], with fax confirmation. I had called and lmvm for amanda at [employer], this am had not heard back.

## 2019-11-28 ENCOUNTER — Inpatient Hospital Stay: Admission: RE | Admit: 2019-11-28 | Payer: Self-pay | Source: Ambulatory Visit

## 2019-11-28 ENCOUNTER — Telehealth: Payer: Self-pay | Admitting: Neurology

## 2019-11-28 ENCOUNTER — Other Ambulatory Visit: Payer: Self-pay

## 2019-11-28 NOTE — Telephone Encounter (Signed)
Patient had an infusion today and at check-out requested to make an appointment with Dr. Epimenio Foot. Patient is established with Dr. Anne Hahn. She states that "they're switching me" and states she was told by infusion that she needed to make an appointment with Dr. Epimenio Foot upon leaving. I advised patient that I would need to ask MD about this change to make sure that they are aware and that change is appropriate. Please advise.

## 2019-11-28 NOTE — Telephone Encounter (Signed)
Called pt. Dr. Anne Hahn and Dr. Epimenio Foot approved pt to switch care to Dr. Epimenio Foot. She had Tysabri infusion today. Scheduled appt with pt for her to see Dr. Epimenio Foot 12/05/19 at 2:30pm, check in 2:00pm. Pt verbalized understanding and appreciation for call. Her next Tysabri infusion is 01/09/20.

## 2019-12-01 ENCOUNTER — Telehealth: Payer: Self-pay | Admitting: *Deleted

## 2019-12-01 NOTE — Telephone Encounter (Signed)
Received fax from [facility] touch prescribing program that pt re-authorized for Tysabri from 12/01/2019 through 06/06/2020. Patient enrollment number: [id number]. Account: [facility]. Site auth number: [id number].

## 2019-12-05 ENCOUNTER — Other Ambulatory Visit: Payer: Self-pay

## 2019-12-05 ENCOUNTER — Encounter: Payer: Self-pay | Admitting: Neurology

## 2019-12-05 ENCOUNTER — Ambulatory Visit: Payer: Managed Care, Other (non HMO) | Admitting: Neurology

## 2019-12-05 VITALS — BP 116/65 | HR 71 | Temp 97.4°F | Ht 63.0 in | Wt 139.5 lb

## 2019-12-05 DIAGNOSIS — R269 Unspecified abnormalities of gait and mobility: Secondary | ICD-10-CM | POA: Diagnosis not present

## 2019-12-05 DIAGNOSIS — Z79899 Other long term (current) drug therapy: Secondary | ICD-10-CM

## 2019-12-05 DIAGNOSIS — G35 Multiple sclerosis: Secondary | ICD-10-CM | POA: Diagnosis not present

## 2019-12-05 NOTE — Progress Notes (Signed)
[facility]  PATIENT: Toni Weaver DOB: [birth year]  REFERRING DOCTOR OR PCP: PCP is Tracey Harries, MD SOURCE: Patient, notes from Dr. Anne Hahn, imaging and lab reports, MRI images personally reviewed.  _________________________________   HISTORICAL  CHIEF COMPLAINT:  Chief Complaint  Patient presents with  . Follow-up    RM 12, alone. Switching from Dr. Anne Hahn to Dr. Epimenio Foot. On Tysabri q 6 weeks now d/t recent JCV value. last drawn on 10/24/19, positive, index: 0.59. Last infusion 11/28/19. Next infusion: 01/09/20    HISTORY OF PRESENT ILLNESS:  Toni Weaver is a 29 year old woman with relapsing remitting multiple sclerosis who is currently on Tysabri.  In 2015, she lost vision OS over one day and she was diagnosed with optic neuritis.   An MRi was c/w MS.   She was intially placed on Rebif.   She had breakthrough disease, she switched to [place] about 3 years ago.   She has tolerated it well and has no exacerbaon or MRi changes.   She converted to JCV Ab positive 04/2019 (0.67) and repeat titer was 0.59 10/24/2019.   Currently, she has reduced gait, worse when she goes longer distance, with dragging of her left foot.  Balance is mildly off.   She denies weakness for the most part.   She has muscle spasticity.   She denies any difficulty with sensation.   Bladder function is fine.   She denies current visual problems.  She notes mild fatigue but feels better if she exercises.   She sleeps well most nights.  Mood is ok.  Cognition is fine but she felt concentration was worse a couple years ago.    She has had her Covid 19 vaccination.       Data reviewed: MRI brian 12/01/2019 (report only):  No change from 2/24.2020.    MRI cervical spine 12/01/2019 (report only):  Patchy T2 hyperintense signal changed   MRI of the brain 11/12/2015 showed T2/flair hyperintense foci in the left cerebellar hemisphere, middle cerebellar peduncles and in the periventricular,  juxtacortical and deep white matter of both hemispheres.  None of the foci enhanced.  MRI of the cervical spine 11/12/2015 showed subtle T2 hyperintense foci within the spinal cord at C2-C3, C4-C5, C7 and T2-T3.  None of these enhance.  MRI of the brain 01/28/2017 was unchanged compared to the 11/12/2015  Lab work recent lab work showed a low positive JCV antibody 04/26/2019 (0.67).  Antibody titer 10/24/2019 was low positive at 0.59..  Due to the possible need to switch medications, she had hepatitis, TB and HIV labs which did not show any active infections.  She is varicella immune.     REVIEW OF SYSTEMS: Constitutional: No fevers, chills, sweats, or change in appetite Eyes: No visual changes, double vision, eye pain Ear, nose and throat: No hearing loss, ear pain, nasal congestion, sore throat Cardiovascular: No chest pain, palpitations Respiratory: No shortness of breath at rest or with exertion.   No wheezes GastrointestinaI: No nausea, vomiting, diarrhea, abdominal pain, fecal incontinence Genitourinary: No dysuria, urinary retention or frequency.  No nocturia. Musculoskeletal: No neck pain, back pain Integumentary: No rash, pruritus, skin lesions Neurological: as above Psychiatric: No depression at this time.  No anxiety Endocrine: No palpitations, diaphoresis, change in appetite, change in weigh or increased thirst Hematologic/Lymphatic: No anemia, purpura, petechiae. Allergic/Immunologic: No itchy/runny eyes, nasal congestion, recent allergic reactions, rashes  ALLERGIES: No Known Allergies  HOME MEDICATIONS:  Current Outpatient Medications:  .  fluticasone (FLONASE)  50 MCG/ACT nasal spray, Place 2 sprays into both nostrils daily. FOR NASAL CONGESTION, Disp: 16 g, Rfl: 0 .  Multiple Vitamin (MULTIVITAMIN) tablet, Take 1 tablet by mouth daily., Disp: , Rfl:  .  Natalizumab (TYSABRI IV), Inject into the vein., Disp: , Rfl:  .  ondansetron (ZOFRAN ODT) 4 MG disintegrating  tablet, Take 1 tablet (4 mg total) by mouth every 8 (eight) hours as needed for nausea or vomiting., Disp: 30 tablet, Rfl: 1 .  tiZANidine (ZANAFLEX) 2 MG tablet, TAKE 1 TABLET BY MOUTH EVERY 8 HOURS AS NEEDED FOR MUSCLE SPASM, Disp: 90 tablet, Rfl: 1  PAST MEDICAL HISTORY: Past Medical History:  Diagnosis Date  . 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 Multiple sclerosis ([place]) 11/24/2012  . Optic neuritis 11/24/2012    PAST SURGICAL HISTORY: Past Surgical History:  Procedure Laterality Date  . APPENDECTOMY      FAMILY HISTORY: Family History  Problem Relation Age of Onset  . Hypertension Mother   . Hypertension Father   . Cancer Maternal Grandmother        Breast cancer    SOCIAL HISTORY:  Social History   Socioeconomic History  . Marital status: Single    Spouse name: Not on file  . Number of children: 0  . Years of education: In college  . Highest education level: Not on file  Occupational History    Employer: OTHER    Comment: Pt. works at [employer]  Tobacco Use  . Smoking status: Never Smoker  . Smokeless tobacco: Never Used  Substance and Sexual Activity  . Alcohol use: No  . Drug use: No  . Sexual activity: Not on file  Other Topics Concern  . Not on file  Social History Narrative   Lives at home w/ mom.   Patient is right handed.   Rarely drinks caffeine.   Social Determinants of Health   Financial Resource Strain:   . Difficulty of Paying Living Expenses:   Food Insecurity:   . Worried About Charity fundraiser in the Last Year:   . Arboriculturist in the Last Year:   Transportation Needs:   . Film/video editor (Medical):   Marland Kitchen Lack of Transportation (Non-Medical):   Physical Activity:   . Days of Exercise per Week:   . Minutes of Exercise per Session:   Stress:   . Feeling of Stress :   Social Connections:   . Frequency of Communication with Friends and Family:   . Frequency of Social Gatherings with Friends and Family:   .  Attends Religious Services:   . Active Member of Clubs or Organizations:   . Attends Archivist Meetings:   Marland Kitchen Marital Status:   Intimate Partner Violence:   . Fear of Current or Ex-Partner:   . Emotionally Abused:   Marland Kitchen Physically Abused:   . Sexually Abused:      PHYSICAL EXAM  Vitals:   12/05/19 1413  BP: 116/65  Pulse: 71  Temp: (!) 97.4 F (36.3 C)  Weight: 139 lb 8 oz (63.3 kg)  Height: 5\' 3"  (1.6 m)    Body mass index is 24.71 kg/m.   General: The patient is well-developed and well-nourished and in no acute distress  HEENT:  Head is [place]/AT.  Sclera are anicteric.  Funduscopic exam shows normal optic discs and retinal vessels.  Neck: No carotid bruits are noted.  The neck is nontender.  Cardiovascular: The heart has a regular rate  and rhythm with a normal S1 and S2. There were no murmurs, gallops or rubs.    Skin: Extremities are without rash or  edema.  Musculoskeletal:  Back is nontender  Neurologic Exam  Mental status: The patient is alert and oriented x 3 at the time of the examination. The patient has apparent normal recent and remote memory, with an apparently normal attention span and concentration ability.   Speech is normal.  Cranial nerves: Extraocular movements are full. Pupils are equal, round, and reactive to light and accomodation.  Visual fields are full.  Facial symmetry is present. There is good facial sensation to soft touch bilaterally.Facial strength is normal.  Trapezius and sternocleidomastoid strength is normal. No dysarthria is noted.  The tongue is midline, and the patient has symmetric elevation of the soft palate. No obvious hearing deficits are noted.  Motor:  Muscle bulk is normal.   Tone is mildly increased in the legs. Strength is  5 / 5 in all 4 extremities.   Sensory: Sensory testing is intact to pinprick, soft touch and vibration sensation in all 4 extremities.  Coordination: Cerebellar testing reveals good  finger-nose-finger and reduced heel-to-shin .  Gait and station: Station is normal.  Gait is slightly wide with a minimal left foot drop and external rotation of the legs..  The tandem gait is very poor.  Romberg is negative..   Reflexes: Deep tendon reflexes are symmetric and normal in arms and 3+ at the knees with spread.  There is no ankle clonus..   Plantar responses are flexor.    DIAGNOSTIC DATA (LABS, IMAGING, TESTING) - I reviewed patient records, labs, notes, testing and imaging myself where available.  Lab Results  Component Value Date   WBC 10.9 (H) 10/24/2019   HGB 11.9 10/24/2019   HCT 34.7 10/24/2019   MCV 82 10/24/2019   PLT 290 10/24/2019      Component Value Date/Time   NA 141 10/25/2019 1647   K 4.2 10/25/2019 1647   CL 106 10/25/2019 1647   CO2 24 10/25/2019 1647   GLUCOSE 80 10/25/2019 1647   BUN 12 10/25/2019 1647   CREATININE 0.71 10/25/2019 1647   CALCIUM 9.1 10/25/2019 1647   PROT 6.6 10/25/2019 1647   ALBUMIN 4.4 10/25/2019 1647   AST 11 10/25/2019 1647   ALT 6 10/25/2019 1647   ALKPHOS 48 10/25/2019 1647   BILITOT 0.3 10/25/2019 1647   GFRNONAA 115 10/25/2019 1647   GFRAA 133 10/25/2019 1647       ASSESSMENT AND PLAN  Multiple sclerosis (HCC)  High risk medication use  Abnormality of gait  In summary, Toni Weaver is a 29 year old woman with multiple sclerosis who is currently on Tysabri as her disease modifying therapy.  She is wanting a second opinion regarding therapy due to recent JCV seroconversion.  The current titer is in the low positive range.  She has been on Tysabri for about 3 years.  I discussed with her that the low positive JCV titer implies that she has a 08/1998 risk of PML.  It is possible that the risk could be lowered with extended interval dosing changing the Tysabri from every 4 weeks to every 6 weeks.  We could make that change to her dosing and she can continue on Tysabri.  However, if she converts from low positive to  high positive, we would need to reconsider a different disease modifying therapy.  Alternatively, we could switch to Ocrevus.  She is reluctant to make any  changes due to her MS being well controlled and Tysabri be less likely than Ocrevus to cause pneumonia..  Therefore, she will continue on Tysabri at every 6-week dosing.  We will recheck the JCV antibody in about another 4 or 5 months.  She will return to see me in 6 months or call sooner if there are new or worsening neurologic symptoms.   Jarel Cuadra A. Epimenio Foot, MD, [facility] 12/05/2019, 6:19 PM Certified in Neurology, Clinical Neurophysiology, Sleep Medicine and Neuroimaging  [facility] [address] [place], [place] [address] [phone]

## 2019-12-26 ENCOUNTER — Encounter: Payer: Self-pay | Admitting: Neurology

## 2020-01-24 ENCOUNTER — Encounter: Payer: Self-pay | Admitting: *Deleted

## 2020-01-24 ENCOUNTER — Telehealth: Payer: Self-pay | Admitting: Neurology

## 2020-01-24 NOTE — Telephone Encounter (Signed)
Noted, emailed pt to let her know letter revised and available on mychart.

## 2020-01-24 NOTE — Telephone Encounter (Signed)
Pt has called re: the letter prepared for accommodations on her job.  Pt states its needs a date(the letter is generally good for year) Pt is asking if the revised letter with a date can be faxed to the attention of Marchelle Folks @ [phone]

## 2020-01-24 NOTE — Telephone Encounter (Signed)
Pt has called back to inform that no the date does not need to change but if the end date of 12-25-20 could be added that would be great.

## 2020-01-24 NOTE — Telephone Encounter (Signed)
Called, LVM for pt to call back. Letter dated for 12/26/19. Needing to know if this needs to be changed.

## 2020-02-07 MED ORDER — METHOCARBAMOL 500 MG PO TABS: 500.0000 mg | ORAL_TABLET | Freq: Three times a day (TID) | ORAL | 1 refills | Status: DC

## 2020-03-06 DIAGNOSIS — Z0279 Encounter for issue of other medical certificate: Secondary | ICD-10-CM

## 2020-03-06 NOTE — Telephone Encounter (Signed)
Gave completed/signed FMLA back to medical records to process for pt. 

## 2020-03-07 ENCOUNTER — Telehealth: Payer: Self-pay | Admitting: Neurology

## 2020-03-07 NOTE — Telephone Encounter (Signed)
Patients Completed Paperwork was faxed back to [facility]

## 2020-04-08 ENCOUNTER — Other Ambulatory Visit: Payer: Self-pay | Admitting: Neurology

## 2020-04-16 ENCOUNTER — Other Ambulatory Visit: Payer: Self-pay | Admitting: Neurology

## 2020-04-16 ENCOUNTER — Telehealth: Payer: Self-pay | Admitting: *Deleted

## 2020-04-16 DIAGNOSIS — G35 Multiple sclerosis: Secondary | ICD-10-CM

## 2020-04-16 DIAGNOSIS — Z79899 Other long term (current) drug therapy: Secondary | ICD-10-CM

## 2020-04-16 NOTE — Addendum Note (Signed)
Addended by: Tamera Stands D on: 04/16/2020 02:54 PM   Modules accepted: Orders

## 2020-04-16 NOTE — Progress Notes (Signed)
[id number]  

## 2020-04-16 NOTE — Telephone Encounter (Signed)
Placed JCV lab in [facility] lock box for routine lab pick up. Results pending. 

## 2020-04-17 LAB — CBC WITH DIFFERENTIAL/PLATELET
Basophils Absolute: 0.1 10*3/uL (ref 0.0–0.2)
Basos: 1 %
EOS (ABSOLUTE): 0.1 10*3/uL (ref 0.0–0.4)
Eos: 1 %
Hematocrit: 34.9 % (ref 34.0–46.6)
Hemoglobin: 11.7 g/dL (ref 11.1–15.9)
Immature Grans (Abs): 0 10*3/uL (ref 0.0–0.1)
Immature Granulocytes: 0 %
Lymphocytes Absolute: 4.5 10*3/uL — ABNORMAL HIGH (ref 0.7–3.1)
Lymphs: 59 %
MCH: 27.6 pg (ref 26.6–33.0)
MCHC: 33.5 g/dL (ref 31.5–35.7)
MCV: 82 fL (ref 79–97)
Monocytes Absolute: 0.8 10*3/uL (ref 0.1–0.9)
Monocytes: 10 %
Neutrophils Absolute: 2.3 10*3/uL (ref 1.4–7.0)
Neutrophils: 29 %
Platelets: 294 10*3/uL (ref 150–450)
RBC: 4.24 x10E6/uL (ref 3.77–5.28)
RDW: 14.5 % (ref 11.7–15.4)
WBC: 7.8 10*3/uL (ref 3.4–10.8)

## 2020-04-23 ENCOUNTER — Ambulatory Visit: Payer: Managed Care, Other (non HMO) | Admitting: Neurology

## 2020-04-23 NOTE — Telephone Encounter (Signed)
JCV ab drawn on 04/16/20 indeterminate, index: 0.35. Inhibition assay: negative.

## 2020-04-27 ENCOUNTER — Other Ambulatory Visit: Payer: Self-pay | Admitting: Neurology

## 2020-05-16 ENCOUNTER — Telehealth: Payer: Self-pay | Admitting: *Deleted

## 2020-05-16 NOTE — Telephone Encounter (Signed)
Received fax from [facility] prescribing program that pt re-authorized for Tysabri from 12/07/2019-06/06/20. Patient enrollment number: [id number]. Account: [facility]. Site auth number: [id number].

## 2020-05-17 NOTE — Telephone Encounter (Signed)
Faxed completed/signed change of provider form back to [employer] yesterday. Received fax confirmation. Faxed completed/signed Tysabri pt status report and reauth questionnaire to [facility] at [phone]. Received confirmation.   Received fax from [facility] touch prescribing program that pt re-authorized for Tysabri from 05/17/20-12/06/20. Patient enrollment number: [id number]. Account: [facility]. Site auth number: [id number].

## 2020-06-11 ENCOUNTER — Ambulatory Visit (INDEPENDENT_AMBULATORY_CARE_PROVIDER_SITE_OTHER): Payer: Managed Care, Other (non HMO) | Admitting: Neurology

## 2020-06-11 ENCOUNTER — Encounter: Payer: Self-pay | Admitting: Neurology

## 2020-06-11 ENCOUNTER — Other Ambulatory Visit: Payer: Self-pay

## 2020-06-11 VITALS — BP 119/80 | HR 85 | Ht 63.0 in | Wt 130.0 lb

## 2020-06-11 DIAGNOSIS — G35 Multiple sclerosis: Secondary | ICD-10-CM

## 2020-06-11 DIAGNOSIS — M21372 Foot drop, left foot: Secondary | ICD-10-CM | POA: Diagnosis not present

## 2020-06-11 DIAGNOSIS — Z79899 Other long term (current) drug therapy: Secondary | ICD-10-CM | POA: Diagnosis not present

## 2020-06-11 DIAGNOSIS — R269 Unspecified abnormalities of gait and mobility: Secondary | ICD-10-CM | POA: Diagnosis not present

## 2020-06-11 NOTE — Progress Notes (Addendum)
[facility]  PATIENT: Toni Weaver DOB: 1991-04-22  REFERRING DOCTOR OR PCP: PCP is Tracey Harries, MD SOURCE: Patient, notes from Dr. Anne Hahn, imaging and lab reports, MRI images personally reviewed.  _________________________________   HISTORICAL  CHIEF COMPLAINT:  Chief Complaint  Patient presents with  . Follow-up    RM 13. Last seen 12/05/2019.   . Multiple Sclerosis    On Tysabri. Last: 04/16/20. Next: 06/11/20. Last JCV: 04/16/20 indeterminate, index: 0.35, inhibition assay: negative.    HISTORY OF PRESENT ILLNESS:  Toni Weaver is a 29 year-old woman with relapsing remitting multiple sclerosis who is currently on Tysabri.  Update 06/11/20: She feels her MS is stable.   She is on Tysabri and tolerates it well   Her JCV Ab is currently negative (0.35).  The JCV antibody does have 2 readings that were low positive (0.59 and 0.67)  She has some difficulty walking at times due to the left leg foot drop.  She stumbles some but no falls.   Foot drop is worse with longer distances.     She denies much spasticity.   She denies painful tingling or any significant numbness.   Bladder function is fine.    Vision is fine but she has been told she had some changes.      She has mild fatigue and works out less because of the fatigue.     She sleeps well.   Mood is good.  Cognition is doing well.      MS history:  In 2015, she lost vision OS over one day and she was diagnosed with optic neuritis.   An MRi was c/w MS.   She was intially placed on Rebif.   She had breakthrough disease, she switched to [place] about 3 years ago.   She has tolerated it well and has no exacerbaon or MRi changes.   She converted to JCV Ab positive 04/2019 (0.67) and repeat titer was 0.59 10/24/2019.  However, in September 2021 it was negative at 0.35.  Data reviewed: MRI brain 12/01/2019 (report only):  No change from 2/24.2020.    MRI cervical spine 12/01/2019 (report only):  Patchy T2  hyperintense signal changed   MRI of the brain 11/12/2015 showed T2/flair hyperintense foci in the left cerebellar hemisphere, middle cerebellar peduncles and in the periventricular, juxtacortical and deep white matter of both hemispheres.  None of the foci enhanced.  MRI of the cervical spine 11/12/2015 showed subtle T2 hyperintense foci within the spinal cord at C2-C3, C4-C5, C7 and T2-T3.  None of these enhance.  MRI of the brain 01/28/2017 was unchanged compared to the 11/12/2015  MRI of the brain 11/30/2019 was unchanged compared to the MRI from 2018.  MRI of the cervical spine 11/30/2019 was unchanged compared to the MRI from 11/12/2015.  Lab work recent lab work showed a low positive JCV antibody 04/26/2019 (0.67).  Antibody titer 10/24/2019 was low positive at 0.59..  Due to the possible need to switch medications, she had hepatitis, TB and HIV labs which did not show any active infections.  She is varicella immune.     REVIEW OF SYSTEMS: Constitutional: No fevers, chills, sweats, or change in appetite Eyes: No visual changes, double vision, eye pain Ear, nose and throat: No hearing loss, ear pain, nasal congestion, sore throat Cardiovascular: No chest pain, palpitations Respiratory: No shortness of breath at rest or with exertion.   No wheezes GastrointestinaI: No nausea, vomiting, diarrhea, abdominal pain, fecal incontinence Genitourinary:  No dysuria, urinary retention or frequency.  No nocturia. Musculoskeletal: No neck pain, back pain Integumentary: No rash, pruritus, skin lesions Neurological: as above Psychiatric: No depression at this time.  No anxiety Endocrine: No palpitations, diaphoresis, change in appetite, change in weigh or increased thirst Hematologic/Lymphatic: No anemia, purpura, petechiae. Allergic/Immunologic: No itchy/runny eyes, nasal congestion, recent allergic reactions, rashes  ALLERGIES: No Known Allergies  HOME MEDICATIONS:  Current Outpatient  Medications:  .  fluticasone (FLONASE) 50 MCG/ACT nasal spray, Place 2 sprays into both nostrils daily. FOR NASAL CONGESTION, Disp: 16 g, Rfl: 0 .  methocarbamol (ROBAXIN) 500 MG tablet, TAKE 1 TABLET BY MOUTH THREE TIMES DAILY, Disp: 90 tablet, Rfl: 1 .  Multiple Vitamin (MULTIVITAMIN) tablet, Take 1 tablet by mouth daily., Disp: , Rfl:  .  Natalizumab (TYSABRI IV), Inject into the vein., Disp: , Rfl:  .  ondansetron (ZOFRAN ODT) 4 MG disintegrating tablet, Take 1 tablet (4 mg total) by mouth every 8 (eight) hours as needed for nausea or vomiting., Disp: 30 tablet, Rfl: 1  PAST MEDICAL HISTORY: Past Medical History:  Diagnosis Date  . 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 Multiple sclerosis (HCC) 11/24/2012  . Optic neuritis 11/24/2012    PAST SURGICAL HISTORY: Past Surgical History:  Procedure Laterality Date  . APPENDECTOMY      FAMILY HISTORY: Family History  Problem Relation Age of Onset  . Hypertension Mother   . Hypertension Father   . Cancer Maternal Grandmother        Breast cancer    SOCIAL HISTORY:  Social History   Socioeconomic History  . Marital status: Single    Spouse name: Not on file  . Number of children: 0  . Years of education: In college  . Highest education level: Not on file  Occupational History    Employer: OTHER    Comment: Pt. works at [employer]  Tobacco Use  . Smoking status: Never Smoker  . Smokeless tobacco: Never Used  Substance and Sexual Activity  . Alcohol use: No  . Drug use: No  . Sexual activity: Not on file  Other Topics Concern  . Not on file  Social History Narrative   Lives at home w/ mom.   Patient is right handed.   Rarely drinks caffeine.   Social Determinants of Health   Financial Resource Strain:   . Difficulty of Paying Living Expenses: Not on file  Food Insecurity:   . Worried About Programme researcher, broadcasting/film/video in the Last Year: Not on file  . Ran Out of Food in the Last Year: Not on file  Transportation  Needs:   . Lack of Transportation (Medical): Not on file  . Lack of Transportation (Non-Medical): Not on file  Physical Activity:   . Days of Exercise per Week: Not on file  . Minutes of Exercise per Session: Not on file  Stress:   . Feeling of Stress : Not on file  Social Connections:   . Frequency of Communication with Friends and Family: Not on file  . Frequency of Social Gatherings with Friends and Family: Not on file  . Attends Religious Services: Not on file  . Active Member of Clubs or Organizations: Not on file  . Attends Banker Meetings: Not on file  . Marital Status: Not on file  Intimate Partner Violence:   . Fear of Current or Ex-Partner: Not on file  . Emotionally Abused: Not on file  . Physically Abused: Not on file  .  Sexually Abused: Not on file     PHYSICAL EXAM  Vitals:   06/11/20 0924  BP: 119/80  Pulse: 85  Weight: 130 lb (59 kg)  Height: 5\' 3"  (1.6 m)    Body mass index is 23.03 kg/m.   General: The patient is well-developed and well-nourished and in no acute distress  HEENT:  Head is [place]/AT.  Sclera are anicteric.    Skin: Extremities are without rash or  edema.  Neurologic Exam  Mental status: The patient is alert and oriented x 3 at the time of the examination. The patient has apparent normal recent and remote memory, with an apparently normal attention span and concentration ability.   Speech is normal.  Cranial nerves: Extraocular movements are full.  Color vision was symmetric.  Facial symmetry is present. There is good facial sensation to soft touch bilaterally. Facial strength is normal.  Trapezius and sternocleidomastoid strength is normal. No dysarthria is noted. No obvious hearing deficits are noted.  Motor:  Muscle bulk is normal.   Tone is mildly increased in the legs. Strength is  5 / 5 in all 4 extremities except 4+/5 foot and ankle extensors on the left.  Sensory: Sensory testing is intact to pinprick, soft touch and  vibration sensation in all 4 extremities.  Coordination: Cerebellar testing reveals good finger-nose-finger and reduced heel-to-shin .  Gait and station: Station is normal.  Gait is slightly wide with a mild left foot drop and external rotation of the legs..  The tandem gait is very poor.  Romberg is negative..   Reflexes: Deep tendon reflexes are symmetric and normal in arms and 3+ at the knees with spread.  There is no ankle clonus..   Plantar responses are flexor.    DIAGNOSTIC DATA (LABS, IMAGING, TESTING) - I reviewed patient records, labs, notes, testing and imaging myself where available.  Lab Results  Component Value Date   WBC 7.8 04/16/2020   HGB 11.7 04/16/2020   HCT 34.9 04/16/2020   MCV 82 04/16/2020   PLT 294 04/16/2020      Component Value Date/Time   NA 141 10/25/2019 1647   K 4.2 10/25/2019 1647   CL 106 10/25/2019 1647   CO2 24 10/25/2019 1647   GLUCOSE 80 10/25/2019 1647   BUN 12 10/25/2019 1647   CREATININE 0.71 10/25/2019 1647   CALCIUM 9.1 10/25/2019 1647   PROT 6.6 10/25/2019 1647   ALBUMIN 4.4 10/25/2019 1647   AST 11 10/25/2019 1647   ALT 6 10/25/2019 1647   ALKPHOS 48 10/25/2019 1647   BILITOT 0.3 10/25/2019 1647   GFRNONAA 115 10/25/2019 1647   GFRAA 133 10/25/2019 1647       ASSESSMENT AND PLAN  Multiple sclerosis (HCC)  High risk medication use  Abnormality of gait  Left foot drop  1.  Continue Tysabri 300 mg every 6 weeks.  She will get her infusion today 2..  Stay active and exercise as tolerated. 3.   We discussed that if the foot drop worsens we could consider having an AFO brace made for her.   4.  Return in 6 months or sooner if there are new or worsening neurologic symptoms.  45-minute office visit with the majority of the time spent face-to-face for history and physical, discussion/counseling and decision-making.  Additional time with record review and documentation.  Samel Bruna A. 10/27/2019, MD, [facility] 06/11/2020, 12:29  PM Certified in Neurology, Clinical Neurophysiology, Sleep Medicine and Neuroimaging  [facility] [address]  [place], [place] [address] [phone]

## 2020-06-13 ENCOUNTER — Telehealth: Payer: Self-pay | Admitting: *Deleted

## 2020-06-13 NOTE — Telephone Encounter (Signed)
Cd and report on [employer] desk from [facility] [facility].

## 2020-07-15 ENCOUNTER — Other Ambulatory Visit: Payer: Self-pay | Admitting: Neurology

## 2020-07-23 ENCOUNTER — Other Ambulatory Visit: Payer: Self-pay | Admitting: Neurology

## 2020-07-23 DIAGNOSIS — G35 Multiple sclerosis: Secondary | ICD-10-CM

## 2020-08-13 ENCOUNTER — Telehealth: Payer: Self-pay | Admitting: Neurology

## 2020-08-13 NOTE — Telephone Encounter (Signed)
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 I faxed the orders they will reach out to her to schedule.

## 2020-08-14 NOTE — Telephone Encounter (Signed)
schedule for 08/20/20  930am

## 2020-08-30 NOTE — Telephone Encounter (Signed)
Scheduled 2/14 815am

## 2020-09-24 ENCOUNTER — Other Ambulatory Visit: Payer: Self-pay | Admitting: *Deleted

## 2020-09-24 DIAGNOSIS — M21372 Foot drop, left foot: Secondary | ICD-10-CM

## 2020-09-24 DIAGNOSIS — G35D Multiple sclerosis, unspecified: Secondary | ICD-10-CM

## 2020-09-24 DIAGNOSIS — G35 Multiple sclerosis: Secondary | ICD-10-CM

## 2020-09-24 DIAGNOSIS — R269 Unspecified abnormalities of gait and mobility: Secondary | ICD-10-CM

## 2020-09-24 MED ORDER — TIZANIDINE HCL 2 MG PO TABS: ORAL_TABLET | ORAL | 3 refills | Status: DC

## 2020-09-24 NOTE — Addendum Note (Signed)
Addended by: Arther Abbott on: 09/24/2020 10:46 AM   Modules accepted: Orders

## 2020-09-28 ENCOUNTER — Other Ambulatory Visit: Payer: Self-pay | Admitting: Neurology

## 2020-11-05 ENCOUNTER — Telehealth: Payer: Self-pay | Admitting: *Deleted

## 2020-11-05 NOTE — Telephone Encounter (Signed)
Faxed completed/signed Tysabri pt status report and reauth questionnaire to [facility] at [phone]. Received confirmation.   Received fax from [facility] touch prescribing program that pt re-authorized for Tysabri from 11/05/20-06/07/21. Patient enrollment number: [id number]. Account: [facility]. Site auth number: [id number].

## 2020-11-06 ENCOUNTER — Encounter: Payer: Self-pay | Admitting: Neurology

## 2020-11-06 ENCOUNTER — Other Ambulatory Visit: Payer: Self-pay

## 2020-11-06 ENCOUNTER — Ambulatory Visit (INDEPENDENT_AMBULATORY_CARE_PROVIDER_SITE_OTHER): Payer: Managed Care, Other (non HMO) | Admitting: Neurology

## 2020-11-06 ENCOUNTER — Telehealth: Payer: Self-pay

## 2020-11-06 VITALS — BP 127/78 | HR 98 | Ht 63.0 in | Wt 132.5 lb

## 2020-11-06 DIAGNOSIS — I7300 Raynaud's syndrome without gangrene: Secondary | ICD-10-CM

## 2020-11-06 DIAGNOSIS — H469 Unspecified optic neuritis: Secondary | ICD-10-CM | POA: Diagnosis not present

## 2020-11-06 DIAGNOSIS — G35 Multiple sclerosis: Secondary | ICD-10-CM

## 2020-11-06 DIAGNOSIS — R252 Cramp and spasm: Secondary | ICD-10-CM

## 2020-11-06 DIAGNOSIS — R269 Unspecified abnormalities of gait and mobility: Secondary | ICD-10-CM

## 2020-11-06 DIAGNOSIS — Z79899 Other long term (current) drug therapy: Secondary | ICD-10-CM | POA: Diagnosis not present

## 2020-11-06 DIAGNOSIS — G35D Multiple sclerosis, unspecified: Secondary | ICD-10-CM

## 2020-11-06 DIAGNOSIS — M21372 Foot drop, left foot: Secondary | ICD-10-CM

## 2020-11-06 MED ORDER — METHYLPREDNISOLONE 4 MG PO TBPK: ORAL_TABLET | ORAL | 0 refills | Status: DC

## 2020-11-06 MED ORDER — BACLOFEN 10 MG PO TABS: 10.0000 mg | ORAL_TABLET | Freq: Three times a day (TID) | ORAL | 3 refills | Status: DC

## 2020-11-06 MED ORDER — VERAPAMIL HCL ER 120 MG PO TBCR: 120.0000 mg | EXTENDED_RELEASE_TABLET | Freq: Every day | ORAL | 5 refills | Status: DC

## 2020-11-06 NOTE — Progress Notes (Signed)
[facility]  PATIENT: Toni Weaver DOB: 1991-07-10  REFERRING DOCTOR OR PCP: PCP is Tracey Harries, MD SOURCE: Patient, notes from Dr. Anne Hahn, imaging and lab reports, MRI images personally reviewed.  _________________________________   HISTORICAL  CHIEF COMPLAINT:  Chief Complaint  Patient presents with  . Follow-up    RM 12, alone. Here to discuss DMT options. Having a lot of fatigue, walking issues and pain. Currently on Tysabri. Started on venlafaxine for depression but stopped a week ago because she was having headaches. Wanted to see if medication was causing headaches. She is still having headaches off of med.    HISTORY OF PRESENT ILLNESS:  Toni Weaver is a 30 year-old woman with relapsing remitting multiple sclerosis who is currently on Tysabri.  Update 11/06/2020: She is feeling worse.   Her hands sometimes become white and cold, especially the fingertips.   This may occur more in cold and she tries to run hot water over it.   Her left foot/toes does a similar thing but she isn''t able to lift hte leg up well to look at colors.   She also has had more pain, especially in her left leg.   She notes the leg jumps and drags at times.   She has had spasticity but it is worse.    Pain is worse at night concurrent with the leg feeling more heavy.   She has tried PT but due to work, timing is difficult.     Currently she is taking 1 mg at night and Robaxin 500 mg po bid.      She has never been on baclofen.    She is on Tysabri and tolerates it well   however, she is JCV antibody low positive.  The last JCV Ab 04/2020 was actually egative (0.35).  However, she had 2 readings that were low positive (0.59 and 0.67).  We had spaced her to every 6 weeks.  She has given more thought and would like to switch to Kesimpta  She has some difficulty walking due to the left leg foot drop.  She stumbles some but no falls.   Foot drop is worse with longer distances.   She does  the spasticity has significantly worsened over the last year.  It bothers her mostly at night.  She has had more left leg pain..   Bladder function is fine.    Vision is fine but she has been told she had some changes.      She has mild fatigue and works out less because of the fatigue.     She sleeps well.   Mood is good.  Cognition is doing well.      MS history:  In 2015, she lost vision OS over one day and she was diagnosed with optic neuritis.   An MRi was c/w MS.   She was intially placed on Rebif.   She had breakthrough disease, she switched to [place] about 3 years ago.   She has tolerated it well and has no exacerbaon or MRi changes.   She converted to JCV Ab positive 04/2019 (0.67) and repeat titer was 0.59 10/24/2019.  However, in September 2021 it was negative at 0.35.   She plans on switching to [facility] April 2022.  Data reviewed: MRI brain 12/01/2019 (report only):  No change from 2/24.2020.    MRI cervical spine 12/01/2019 (report only):  Patchy T2 hyperintense signal changed   MRI of the brain 11/12/2015 showed T2/flair  hyperintense foci in the left cerebellar hemisphere, middle cerebellar peduncles and in the periventricular, juxtacortical and deep white matter of both hemispheres.  None of the foci enhanced.  MRI of the cervical spine 11/12/2015 showed subtle T2 hyperintense foci within the spinal cord at C2-C3, C4-C5, C7 and T2-T3.  None of these enhance.  MRI of the brain 01/28/2017 was unchanged compared to the 11/12/2015  MRI of the brain 11/30/2019 was unchanged compared to the MRI from 2018.  MRI of the cervical spine 11/30/2019 was unchanged compared to the MRI from 11/12/2015.  Lab work recent lab work showed a low positive JCV antibody 04/26/2019 (0.67).  Antibody titer 10/24/2019 was low positive at 0.59..  Due to the possible need to switch medications, she had hepatitis, TB and HIV labs which did not show any active infections.  She is varicella immune.     REVIEW OF  SYSTEMS: Constitutional: No fevers, chills, sweats, or change in appetite Eyes: No visual changes, double vision, eye pain Ear, nose and throat: No hearing loss, ear pain, nasal congestion, sore throat Cardiovascular: No chest pain, palpitations Respiratory: No shortness of breath at rest or with exertion.   No wheezes GastrointestinaI: No nausea, vomiting, diarrhea, abdominal pain, fecal incontinence Genitourinary: No dysuria, urinary retention or frequency.  No nocturia. Musculoskeletal: No neck pain, back pain Integumentary: No rash, pruritus, skin lesions Neurological: as above Psychiatric: No depression at this time.  No anxiety Endocrine: No palpitations, diaphoresis, change in appetite, change in weigh or increased thirst Hematologic/Lymphatic: No anemia, purpura, petechiae. Allergic/Immunologic: No itchy/runny eyes, nasal congestion, recent allergic reactions, rashes  ALLERGIES: No Known Allergies  HOME MEDICATIONS:  Current Outpatient Medications:  .  baclofen (LIORESAL) 10 MG tablet, Take 1 tablet (10 mg total) by mouth 3 (three) times daily., Disp: 90 each, Rfl: 3 .  fluticasone (FLONASE) 50 MCG/ACT nasal spray, Place 2 sprays into both nostrils daily. FOR NASAL CONGESTION, Disp: 16 g, Rfl: 0 .  methylPREDNISolone (MEDROL DOSEPAK) 4 MG TBPK tablet, 6 pills po x 1 day, then 5 pills po x 1 day, then 4 pills po x 1 day, then 3 pills po x 1 day, then 2 pills po x 1 day, then 1 pill po x 1 day, Disp: 21 tablet, Rfl: 0 .  Multiple Vitamin (MULTIVITAMIN) tablet, Take 1 tablet by mouth daily., Disp: , Rfl:  .  Natalizumab (TYSABRI IV), Inject into the vein., Disp: , Rfl:  .  ondansetron (ZOFRAN ODT) 4 MG disintegrating tablet, Take 1 tablet (4 mg total) by mouth every 8 (eight) hours as needed for nausea or vomiting., Disp: 30 tablet, Rfl: 1 .  tiZANidine (ZANAFLEX) 2 MG tablet, TAKE 1 TABLET BY MOUTH AT BEDTIME, Disp: 90 tablet, Rfl: 3 .  verapamil (CALAN-SR) 120 MG CR tablet,  Take 1 tablet (120 mg total) by mouth at bedtime., Disp: 30 tablet, Rfl: 5  PAST MEDICAL HISTORY: Past Medical History:  Diagnosis Date  . 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 Multiple sclerosis (HCC) 11/24/2012  . Optic neuritis 11/24/2012    PAST SURGICAL HISTORY: Past Surgical History:  Procedure Laterality Date  . APPENDECTOMY      FAMILY HISTORY: Family History  Problem Relation Age of Onset  . Hypertension Mother   . Hypertension Father   . Cancer Maternal Grandmother        Breast cancer    SOCIAL HISTORY:  Social History   Socioeconomic History  . Marital status: Single    Spouse name: Not on  file  . Number of children: 0  . Years of education: In college  . Highest education level: Not on file  Occupational History    Employer: OTHER    Comment: Pt. works at [employer]  Tobacco Use  . Smoking status: Never Smoker  . Smokeless tobacco: Never Used  Substance and Sexual Activity  . Alcohol use: No  . Drug use: No  . Sexual activity: Not on file  Other Topics Concern  . Not on file  Social History Narrative   Lives at home w/ mom.   Patient is right handed.   Rarely drinks caffeine.   Social Determinants of Health   Financial Resource Strain: Not on file  Food Insecurity: Not on file  Transportation Needs: Not on file  Physical Activity: Not on file  Stress: Not on file  Social Connections: Not on file  Intimate Partner Violence: Not on file     PHYSICAL EXAM  Vitals:   11/06/20 0739  BP: 127/78  Pulse: 98  Weight: 132 lb 8 oz (60.1 kg)  Height: 5\' 3"  (1.6 m)    Body mass index is 23.47 kg/m.   General: The patient is well-developed and well-nourished and in no acute distress  HEENT:  Head is [place]/AT.  Sclera are anicteric.    Skin: Extremities are without rash or  edema.  Neurologic Exam  Mental status: The patient is alert and oriented x 3 at the time of the examination. The patient has apparent normal recent and  remote memory, with an apparently normal attention span and concentration ability.   Speech is normal.  Cranial nerves: Extraocular movements are full.  Color vision was symmetric.  Facial strength and sensation was normal.  No dysarthria.  No obvious hearing deficits are noted.  Motor:  Muscle bulk is normal.   Tone is mildly increased in the left leg. Strength is  5 / 5 in all 4 extremities except 4+/5 foot and ankle extensors on the left.  Sensory: Sensory testing is intact to pinprick, soft touch and vibration sensation in all 4 extremities.  Coordination: Cerebellar testing reveals good finger-nose-finger and reduced heel-to-shin .  Gait and station: Station is normal.  Gait is slightly wide and spastic with a mild left foot drop and external rotation of the legs..  The tandem gait is very poor.  Romberg is negative..   Reflexes: Deep tendon reflexes are symmetric and normal in arms and 3+ at the knees with spread, more on the left.  There is no ankle clonus..   Plantar responses are flexor.    DIAGNOSTIC DATA (LABS, IMAGING, TESTING) - I reviewed patient records, labs, notes, testing and imaging myself where available.  Lab Results  Component Value Date   WBC 7.8 04/16/2020   HGB 11.7 04/16/2020   HCT 34.9 04/16/2020   MCV 82 04/16/2020   PLT 294 04/16/2020      Component Value Date/Time   NA 141 10/25/2019 1647   K 4.2 10/25/2019 1647   CL 106 10/25/2019 1647   CO2 24 10/25/2019 1647   GLUCOSE 80 10/25/2019 1647   BUN 12 10/25/2019 1647   CREATININE 0.71 10/25/2019 1647   CALCIUM 9.1 10/25/2019 1647   PROT 6.6 10/25/2019 1647   ALBUMIN 4.4 10/25/2019 1647   AST 11 10/25/2019 1647   ALT 6 10/25/2019 1647   ALKPHOS 48 10/25/2019 1647   BILITOT 0.3 10/25/2019 1647   GFRNONAA 115 10/25/2019 1647   GFRAA 133 10/25/2019 1647  ASSESSMENT AND PLAN  Multiple sclerosis (HCC) - Plan: CBC with Differential/Platelet, Hepatic function panel, IgG, IgA,  IgM  Raynaud's disease without gangrene - Plan: Cryoglobulin, ANA w/Reflex  High risk medication use - Plan: IgG, IgA, IgM  Optic neuritis  Abnormality of gait  Left foot drop  Spasticity  1.   She is JCV antibody low positive and wishes to switch from Tysabri to a different medication.  We had gone over options in the past and she wishes to switch to Kesimpta.  Hepatitis and TB labs were normal last year.  I will recheck the CBC with differential and hepatic function panel today..  One gram IV Solu-medrol 2.. She appears to be having Raynaud's phenomenon in the hands and cold weather and possibly also in the feet.  I will add a calcium channel blocker to see if that helps.. 3.   Change Robaxin to  baclofen and try to titrate up to 10 mg 3 times daily if tolerated 4.   Return in 6 months or sooner if there are new or worsening neurologic symptoms.  45-minute office visit with the majority of the time spent face-to-face for history and physical, discussion/counseling, discussing new treatments and decision-making.  Additional time with record review and documentation.  Nandita Mathenia A. Epimenio Foot, MD, [facility] 11/06/2020, 10:49 AM Certified in Neurology, Clinical Neurophysiology, Sleep Medicine and Neuroimaging  [facility] [address], [address] [place], [place] [address] [phone]

## 2020-11-06 NOTE — Telephone Encounter (Signed)
Received Kesimpta start form from Dr. Epimenio Foot.  Faxed to alongside [facility].  Received a receipt of confirmation.

## 2020-11-07 NOTE — Telephone Encounter (Signed)
Patient Support Program ([facility]) called, enrollment form missing some information; diagnostic code, patient's phone number. Can refax to: [phone]. Can go back if you need help.  Contact info: [phone]

## 2020-11-12 LAB — CBC WITH DIFFERENTIAL/PLATELET
Basophils Absolute: 0.1 10*3/uL (ref 0.0–0.2)
Basos: 1 %
EOS (ABSOLUTE): 0.1 10*3/uL (ref 0.0–0.4)
Eos: 1 %
Hematocrit: 41.1 % (ref 34.0–46.6)
Hemoglobin: 13.7 g/dL (ref 11.1–15.9)
Immature Grans (Abs): 0 10*3/uL (ref 0.0–0.1)
Immature Granulocytes: 0 %
Lymphocytes Absolute: 3.3 10*3/uL — ABNORMAL HIGH (ref 0.7–3.1)
Lymphs: 43 %
MCH: 27.8 pg (ref 26.6–33.0)
MCHC: 33.3 g/dL (ref 31.5–35.7)
MCV: 84 fL (ref 79–97)
Monocytes Absolute: 0.8 10*3/uL (ref 0.1–0.9)
Monocytes: 10 %
Neutrophils Absolute: 3.4 10*3/uL (ref 1.4–7.0)
Neutrophils: 45 %
Platelets: 304 10*3/uL (ref 150–450)
RBC: 4.92 x10E6/uL (ref 3.77–5.28)
RDW: 14.5 % (ref 11.7–15.4)
WBC: 7.7 10*3/uL (ref 3.4–10.8)

## 2020-11-12 LAB — HEPATIC FUNCTION PANEL
ALT: 9 IU/L (ref 0–32)
AST: 14 IU/L (ref 0–40)
Albumin: 4.8 g/dL (ref 3.9–5.0)
Alkaline Phosphatase: 48 IU/L (ref 44–121)
Bilirubin Total: 0.2 mg/dL (ref 0.0–1.2)
Bilirubin, Direct: <0.1 mg/dL (ref 0.00–0.40)
Total Protein: 7.1 g/dL (ref 6.0–8.5)

## 2020-11-12 LAB — ANA W/REFLEX: Anti Nuclear Antibody (ANA): POSITIVE — AB

## 2020-11-12 LAB — ENA+DNA/DS+SJORGEN'S
ENA RNP Ab: 0.3 AI (ref 0.0–0.9)
ENA SM Ab Ser-aCnc: <0.2 AI (ref 0.0–0.9)
ENA SSA (RO) Ab: >8 AI — ABNORMAL HIGH (ref 0.0–0.9)
ENA SSB (LA) Ab: <0.2 AI (ref 0.0–0.9)
dsDNA Ab: 1 IU/mL (ref 0–9)

## 2020-11-12 LAB — IGG, IGA, IGM
IgA/Immunoglobulin A, Serum: 338 mg/dL (ref 87–352)
IgG (Immunoglobin G), Serum: 1042 mg/dL (ref 586–1602)
IgM (Immunoglobulin M), Srm: 42 mg/dL (ref 26–217)

## 2020-11-12 LAB — CRYOGLOBULIN

## 2020-11-12 NOTE — Telephone Encounter (Signed)
I called [facility]. I have tried lightening the color on the fax so that perhaps they can see the signatures better.  They will let me know in a few hours if this did not work.

## 2020-11-12 NOTE — Telephone Encounter (Signed)
This is already been included on the start form.  I have refaxed it to [facility].

## 2020-11-12 NOTE — Telephone Encounter (Signed)
I called alongside [employer].  They did receive the start form and can see patients signature.  They will run a benefits investigation to find out if a PA is needed and let us know.

## 2020-11-13 NOTE — Telephone Encounter (Signed)
I called [employer].  Patient's pharmacy benefits manager is [employer].  I completed a PA for Kesimpta via CMM.  Sent to [employer].  Should have a determination within 3-5 business days. Key: [id number]

## 2020-11-19 NOTE — Telephone Encounter (Signed)
I called [employer] to check the status of the PA.  There is one question left outstanding that they had to clarify.  I clarified that patient does have relapsing remitting MS.  We should have a determination within 24 hours.  The case ID is [id number].

## 2020-11-20 NOTE — Telephone Encounter (Signed)
I called [employer] to check the status of the PA.  There is not yet a determination.

## 2020-11-20 NOTE — Telephone Encounter (Signed)
PA for Althia Forts was approved by [employer] from  Dec 11, 2020 to November 10, 2021, [place] #[id number].Marland Kitchen  An additional approval has been entered for 1 initial fill of 1.2 mL for the first 28 days effective November 20, 2020 until Dec 19, 2020, [place] #[id number].  I have informed Pioche Lions at [employer].  I called patient to discuss.  No answer, left a voicemail asking patient to call me back.

## 2020-11-27 NOTE — Telephone Encounter (Signed)
I called patient again to discuss.  No answer, left a voicemail asking her to call me back.  Patient should have received her Kesimpta and started injections I want to make sure she is tolerating the injections well and has no questions or concerns for [place].  Patient also may respond via MyChart.

## 2020-12-05 ENCOUNTER — Telehealth: Payer: Self-pay | Admitting: Neurology

## 2020-12-05 NOTE — Telephone Encounter (Signed)
Marcelino Duster @ [employer] is asking for a call from [facility] re: if this pt is on hold for tysabri infusions, please call

## 2020-12-05 NOTE — Telephone Encounter (Signed)
Faxed completed/signed Tysabri d/c questionnaire back to [place] [facility] prescribing program at [phone]. Received fax confirmation.  

## 2020-12-05 NOTE — Telephone Encounter (Signed)
Called back and spoke w/ Morrie Sheldon. Advised pt no longer on Tysabri. Therapy changed. She will send message to Marcelino Duster so they can send [place] Tysabri d/c form to complete for pt. Nothing further needed.

## 2020-12-10 ENCOUNTER — Ambulatory Visit: Payer: Managed Care, Other (non HMO) | Admitting: Neurology

## 2020-12-24 ENCOUNTER — Ambulatory Visit (INDEPENDENT_AMBULATORY_CARE_PROVIDER_SITE_OTHER): Payer: Managed Care, Other (non HMO) | Admitting: Neurology

## 2020-12-24 ENCOUNTER — Encounter: Payer: Self-pay | Admitting: Neurology

## 2020-12-24 VITALS — BP 116/83 | HR 93 | Ht 63.0 in | Wt 128.0 lb

## 2020-12-24 DIAGNOSIS — R269 Unspecified abnormalities of gait and mobility: Secondary | ICD-10-CM | POA: Diagnosis not present

## 2020-12-24 DIAGNOSIS — I7300 Raynaud's syndrome without gangrene: Secondary | ICD-10-CM | POA: Insufficient documentation

## 2020-12-24 DIAGNOSIS — M21372 Foot drop, left foot: Secondary | ICD-10-CM | POA: Diagnosis not present

## 2020-12-24 DIAGNOSIS — G35 Multiple sclerosis: Secondary | ICD-10-CM | POA: Diagnosis not present

## 2020-12-24 DIAGNOSIS — R252 Cramp and spasm: Secondary | ICD-10-CM

## 2020-12-24 MED ORDER — PREDNISONE 50 MG PO TABS: ORAL_TABLET | ORAL | 0 refills | Status: DC

## 2020-12-24 NOTE — Progress Notes (Signed)
[facility]  PATIENT: Toni Weaver DOB: 1990-10-21  REFERRING DOCTOR OR PCP: PCP is Tracey Harries, MD SOURCE: Patient, notes from Dr. Anne Hahn, imaging and lab reports, MRI images personally reviewed.  _________________________________   HISTORICAL  CHIEF COMPLAINT:  Chief Complaint  Patient presents with  . Follow-up    RM 13, alone. Last seen 11/06/20.     HISTORY OF PRESENT ILLNESS:  Toni Savitz is a 30 year-old woman with relapsing remitting multiple sclerosis who is currently on Kesimpta.  Update 12/24/2020: She is noting more weakness in her left leg over the last week.   She has trouble wiggling her toes.   Of note, she does have an existing left foot drop.   Her right leg and both arms/hands feel fine.      She was on Tysabri x 4 years and switched to Kesimpta last month (has done her 3 loading doses).    She has tolerated the Kesimpta injections well.  She had a recent URI but is feeling baseline now.   At the last visit, verapamil was added for Raynaud's syndrome.  This seems to be doing better.  She notes some spasticity in her legs.  Sometimes this can be painful.  Baclofen has helped better than Robaxin.  She has some difficulty walking due to the left leg foot drop.  She stumbles some but no falls.   Foot drop is worse with longer distances.   She does the spasticity has significantly worsened over the last year.  It bothers her mostly at night.  She has had more left leg pain..   Bladder function is fine.    Vision is fine but she has been told she had some changes.      She has mild fatigue and works out less because of the fatigue.     She sleeps well most nights.  Mood and cognition are doing well.  MS history:  In 2015, she lost vision OS over one day and she was diagnosed with optic neuritis.   An MRi was c/w MS.   She was intially placed on Rebif.   She had breakthrough disease, she switched to Tysabri about 3 years ago.   She has  tolerated it well and has no exacerbaon or MRi changes.   She converted to JCV Ab positive 04/2019 (0.67) and repeat titer was 0.59 10/24/2019.  However, in September 2021 it was negative at 0.35.   She switched to [facility] April 2022.  Data reviewed: MRI brain 12/01/2019 (report only):  No change from 2/24.2020.    MRI cervical spine 12/01/2019 (report only):  Patchy T2 hyperintense signal changed   MRI of the brain 11/12/2015 showed T2/flair hyperintense foci in the left cerebellar hemisphere, middle cerebellar peduncles and in the periventricular, juxtacortical and deep white matter of both hemispheres.  None of the foci enhanced.  MRI of the cervical spine 11/12/2015 showed subtle T2 hyperintense foci within the spinal cord at C2-C3, C4-C5, C7 and T2-T3.  None of these enhance.  MRI of the brain 01/28/2017 was unchanged compared to the 11/12/2015  MRI of the brain 11/30/2019 was unchanged compared to the MRI from 2018.  MRI of the cervical spine 11/30/2019 was unchanged compared to the MRI from 11/12/2015.  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  Antibody titer 10/24/2019 was low positive at 0.59..  Due to the possible need to switch medications, she had hepatitis, TB and HIV labs which did not  show any active infections.  She is varicella immune.     REVIEW OF SYSTEMS: Constitutional: No fevers, chills, sweats, or change in appetite Eyes: No visual changes, double vision, eye pain Ear, nose and throat: No hearing loss, ear pain, nasal congestion, sore throat Cardiovascular: No chest pain, palpitations Respiratory: No shortness of breath at rest or with exertion.   No wheezes GastrointestinaI: No nausea, vomiting, diarrhea, abdominal pain, fecal incontinence Genitourinary: No dysuria, urinary retention or frequency.  No nocturia. Musculoskeletal: No neck pain, back pain Integumentary: No rash, pruritus, skin lesions Neurological: as above Psychiatric: No depression at  this time.  No anxiety Endocrine: No palpitations, diaphoresis, change in appetite, change in weigh or increased thirst Hematologic/Lymphatic: No anemia, purpura, petechiae. Allergic/Immunologic: No itchy/runny eyes, nasal congestion, recent allergic reactions, rashes  ALLERGIES: No Known Allergies  HOME MEDICATIONS:  Current Outpatient Medications:  .  baclofen (LIORESAL) 10 MG tablet, Take 1 tablet (10 mg total) by mouth 3 (three) times daily., Disp: 90 each, Rfl: 3 .  fluticasone (FLONASE) 50 MCG/ACT nasal spray, Place 2 sprays into both nostrils daily. FOR NASAL CONGESTION, Disp: 16 g, Rfl: 0 .  methylPREDNISolone (MEDROL DOSEPAK) 4 MG TBPK tablet, 6 pills po x 1 day, then 5 pills po x 1 day, then 4 pills po x 1 day, then 3 pills po x 1 day, then 2 pills po x 1 day, then 1 pill po x 1 day, Disp: 21 tablet, Rfl: 0 .  Multiple Vitamin (MULTIVITAMIN) tablet, Take 1 tablet by mouth daily., Disp: , Rfl:  .  Natalizumab (TYSABRI IV), Inject into the vein., Disp: , Rfl:  .  ondansetron (ZOFRAN ODT) 4 MG disintegrating tablet, Take 1 tablet (4 mg total) by mouth every 8 (eight) hours as needed for nausea or vomiting., Disp: 30 tablet, Rfl: 1 .  tiZANidine (ZANAFLEX) 2 MG tablet, TAKE 1 TABLET BY MOUTH AT BEDTIME, Disp: 90 tablet, Rfl: 3 .  verapamil (CALAN-SR) 120 MG CR tablet, Take 1 tablet (120 mg total) by mouth at bedtime., Disp: 30 tablet, Rfl: 5  PAST MEDICAL HISTORY: Past Medical History:  Diagnosis Date  . 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 Multiple sclerosis (HCC) 11/24/2012  . Optic neuritis 11/24/2012    PAST SURGICAL HISTORY: Past Surgical History:  Procedure Laterality Date  . APPENDECTOMY      FAMILY HISTORY: Family History  Problem Relation Age of Onset  . Hypertension Mother   . Hypertension Father   . Cancer Maternal Grandmother        Breast cancer    SOCIAL HISTORY:  Social History   Socioeconomic History  . Marital status: Single    Spouse name: Not on  file  . Number of children: 0  . Years of education: In college  . Highest education level: Not on file  Occupational History    Employer: OTHER    Comment: Pt. works at [employer]  Tobacco Use  . Smoking status: Never Smoker  . Smokeless tobacco: Never Used  Substance and Sexual Activity  . Alcohol use: No  . Drug use: No  . Sexual activity: Not on file  Other Topics Concern  . Not on file  Social History Narrative   Lives at home w/ mom.   Patient is right handed.   Rarely drinks caffeine.   Social Determinants of Health   Financial Resource Strain: Not on file  Food Insecurity: Not on file  Transportation Needs: Not on file  Physical Activity: Not  on file  Stress: Not on file  Social Connections: Not on file  Intimate Partner Violence: Not on file     PHYSICAL EXAM  Vitals:   12/24/20 0901  BP: 116/83  Pulse: 93  Weight: 128 lb (58.1 kg)  Height: 5\' 3"  (1.6 m)    Body mass index is 22.67 kg/m.   General: The patient is well-developed and well-nourished and in no acute distress  HEENT:  Head is [place]/AT.  Sclera are anicteric.    Skin: Extremities are without rash or  edema.  Neurologic Exam  Mental status: The patient is alert and oriented x 3 at the time of the examination. The patient has apparent normal recent and remote memory, with an apparently normal attention span and concentration ability.   Speech is normal.  Cranial nerves: Extraocular movements are full.  Color vision was symmetric.  Facial strength and sensation was normal.  No dysarthria.  No obvious hearing deficits are noted.  Motor:  Muscle bulk is normal.   Muscle tone is increased in the left leg.  Strength is 4/5 in the left foot and ankle extensors and 5/5 elsewhere.    Sensory: Sensory testing is intact to pinprick, soft touch and vibration sensation in all 4 extremities.  Coordination: Cerebellar testing reveals good finger-nose-finger and reduced heel-to-shin  .  Gait and station: Station is normal.  Her gait is mildly wide and spastic.  She has a left foot drop.  She cannot do a tandem gait..   Reflexes: Deep tendon reflexes are symmetric and normal in arms and 3+ at the knees with spread, more on the left.  There is no ankle clonus.        DIAGNOSTIC DATA (LABS, IMAGING, TESTING) - I reviewed patient records, labs, notes, testing and imaging myself where available.  Lab Results  Component Value Date   WBC 7.7 11/06/2020   HGB 13.7 11/06/2020   HCT 41.1 11/06/2020   MCV 84 11/06/2020   PLT 304 11/06/2020      Component Value Date/Time   NA 141 10/25/2019 1647   K 4.2 10/25/2019 1647   CL 106 10/25/2019 1647   CO2 24 10/25/2019 1647   GLUCOSE 80 10/25/2019 1647   BUN 12 10/25/2019 1647   CREATININE 0.71 10/25/2019 1647   CALCIUM 9.1 10/25/2019 1647   PROT 7.1 11/06/2020 0844   ALBUMIN 4.8 11/06/2020 0844   AST 14 11/06/2020 0844   ALT 9 11/06/2020 0844   ALKPHOS 48 11/06/2020 0844   BILITOT 0.2 11/06/2020 0844   GFRNONAA 115 10/25/2019 1647   GFRAA 133 10/25/2019 1647       ASSESSMENT AND PLAN  Multiple sclerosis (HCC)  Left foot drop  Abnormality of gait  Raynaud's disease without gangrene  Spasticity   1.   One gram IV Solu-medrol today and follow with 600 mg po daily methylprednisolone x 3 more days.   If further worsening, consider imaging 2.. Continue Kesimpta 3.   Cotinue baclofen 10 mg 3 times daily  4.   Return in 4-5 months or sooner if there are new or worsening neurologic symptoms.  Travis Purk A. 10/27/2019, MD, [facility] 12/24/2020, 9:28 AM Certified in Neurology, Clinical Neurophysiology, Sleep Medicine and Neuroimaging  [facility] [address] [place], [place] [phone]

## 2020-12-25 ENCOUNTER — Telehealth: Payer: Medicaid Other | Admitting: Family

## 2020-12-25 DIAGNOSIS — U071 COVID-19: Secondary | ICD-10-CM

## 2020-12-26 MED ORDER — BENZONATATE 100 MG PO CAPS: 100.0000 mg | ORAL_CAPSULE | Freq: Three times a day (TID) | ORAL | 0 refills | Status: DC | PRN

## 2020-12-26 MED ORDER — FLUTICASONE PROPIONATE 50 MCG/ACT NA SUSP: 2.0000 | Freq: Every day | NASAL | 6 refills | Status: DC

## 2020-12-26 MED ORDER — ALBUTEROL SULFATE HFA 108 (90 BASE) MCG/ACT IN AERS: 2.0000 | INHALATION_SPRAY | Freq: Four times a day (QID) | RESPIRATORY_TRACT | 0 refills | Status: DC | PRN

## 2020-12-26 NOTE — Progress Notes (Signed)
E-Visit for Positive Covid Test Result We are sorry you are not feeling well. We are here to help!  You have tested positive for COVID-19, meaning that you were infected with the novel coronavirus and could give the virus to others.  It is vitally important that you stay home so you do not spread it to others.      Please continue isolation at home, for at least 10 days since the start of your symptoms and until you have had 24 hours with no fever (without taking a fever reducer) and with improving of symptoms.  If you have no symptoms but tested positive (or all symptoms resolve after 5 days and you have no fever) you can leave your house but continue to wear a mask around others for an additional 5 days. If you have a fever,continue to stay home until you have had 24 hours of no fever. Most cases improve 5-10 days from onset but we have seen a small number of patients who have gotten worse after the 10 days.  Please be sure to watch for worsening symptoms and remain taking the proper precautions.   Go to the nearest hospital ED for assessment if fever/cough/breathlessness are severe or illness seems like a threat to life.    The following symptoms may appear 2-14 days after exposure: . Fever . Cough . Shortness of breath or difficulty breathing . Chills . Repeated shaking with chills . Muscle pain . Headache . Sore throat . New loss of taste or smell . Fatigue . Congestion or runny nose . Nausea or vomiting . Diarrhea  You have been enrolled in MyChart Home Monitoring for COVID-19. Daily you will receive a questionnaire within the MyChart website. Our COVID-19 response team will be monitoring your responses daily.  You can use medication such as prescription cough medication called Tessalon Perles 100 mg. You may take 1-2 capsules every 8 hours as needed for cough,  prescription inhaler called Albuterol MDI 90 mcg /actuation 2 puffs every 4 hours as needed for shortness of breath,  wheezing, cough and prescription for Fluticasone nasal spray 2 sprays in each nostril one time per day  You may also take acetaminophen (Tylenol) as needed for fever.  HOME CARE: . Only take medications as instructed by your medical team. . Drink plenty of fluids and get plenty of rest. . A steam or ultrasonic humidifier can help if you have congestion.   GET HELP RIGHT AWAY IF YOU HAVE EMERGENCY WARNING SIGNS.  Call 911 or proceed to your closest emergency facility if: . You develop worsening high fever. . Trouble breathing . Bluish lips or face . Persistent pain or pressure in the chest . New confusion . Inability to wake or stay awake . You cough up blood. . Your symptoms become more severe . Inability to hold down food or fluids  This list is not all possible symptoms. Contact your medical provider for any symptoms that are severe or concerning to you.    Your e-visit answers were reviewed by a board certified advanced clinical practitioner to complete your personal care plan.  Depending on the condition, your plan could have included both over the counter or prescription medications.  If there is a problem please reply once you have received a response from your provider.  Your safety is important to us.  If you have drug allergies check your prescription carefully.    You can use MyChart to ask questions about today's visit, request a   non-urgent call back, or ask for a work or school excuse for 24 hours related to this e-Visit. If it has been greater than 24 hours you will need to follow up with your provider, or enter a new e-Visit to address those concerns. You will get an e-mail in the next two days asking about your experience.  I hope that your e-visit has been valuable and will speed your recovery. Thank you for using e-visits.     Approximately 5 minutes was spent documenting and reviewing patient's chart.    

## 2021-03-09 ENCOUNTER — Encounter (HOSPITAL_COMMUNITY): Payer: Self-pay | Admitting: *Deleted

## 2021-03-09 ENCOUNTER — Other Ambulatory Visit: Payer: Self-pay

## 2021-03-09 ENCOUNTER — Observation Stay (HOSPITAL_COMMUNITY): Payer: Managed Care, Other (non HMO)

## 2021-03-09 ENCOUNTER — Inpatient Hospital Stay (HOSPITAL_COMMUNITY)
Admission: EM | Admit: 2021-03-09 | Discharge: 2021-03-11 | DRG: 058 | Disposition: A | Discharge status: Home / Self Care | Payer: Managed Care, Other (non HMO) | Attending: Family Medicine | Admitting: Family Medicine

## 2021-03-09 DIAGNOSIS — U071 COVID-19: Secondary | ICD-10-CM

## 2021-03-09 DIAGNOSIS — M62838 Other muscle spasm: Secondary | ICD-10-CM | POA: Diagnosis present

## 2021-03-09 DIAGNOSIS — T50Z95A Adverse effect of other vaccines and biological substances, initial encounter: Secondary | ICD-10-CM | POA: Diagnosis present

## 2021-03-09 DIAGNOSIS — Z79899 Other long term (current) drug therapy: Secondary | ICD-10-CM

## 2021-03-09 DIAGNOSIS — I7300 Raynaud's syndrome without gangrene: Secondary | ICD-10-CM | POA: Diagnosis present

## 2021-03-09 DIAGNOSIS — D849 Immunodeficiency, unspecified: Secondary | ICD-10-CM | POA: Diagnosis present

## 2021-03-09 DIAGNOSIS — R32 Unspecified urinary incontinence: Secondary | ICD-10-CM | POA: Diagnosis present

## 2021-03-09 DIAGNOSIS — E876 Hypokalemia: Secondary | ICD-10-CM

## 2021-03-09 DIAGNOSIS — F32A Depression, unspecified: Secondary | ICD-10-CM | POA: Diagnosis present

## 2021-03-09 DIAGNOSIS — R531 Weakness: Secondary | ICD-10-CM

## 2021-03-09 DIAGNOSIS — G35 Multiple sclerosis: Secondary | ICD-10-CM | POA: Diagnosis not present

## 2021-03-09 LAB — CBC
HCT: 32.8 % — ABNORMAL LOW (ref 36.0–46.0)
Hemoglobin: 11.7 g/dL — ABNORMAL LOW (ref 12.0–15.0)
MCH: 28.2 pg (ref 26.0–34.0)
MCHC: 35.7 g/dL (ref 30.0–36.0)
MCV: 79 fL — ABNORMAL LOW (ref 80.0–100.0)
Platelets: 292 10*3/uL (ref 150–400)
RBC: 4.15 MIL/uL (ref 3.87–5.11)
RDW: 13.3 % (ref 11.5–15.5)
WBC: 6.2 10*3/uL (ref 4.0–10.5)
nRBC: 0 % (ref 0.0–0.2)

## 2021-03-09 LAB — BASIC METABOLIC PANEL
Anion gap: 6 (ref 5–15)
BUN: 11 mg/dL (ref 6–20)
CO2: 27 mmol/L (ref 22–32)
Calcium: 8.9 mg/dL (ref 8.9–10.3)
Chloride: 101 mmol/L (ref 98–111)
Creatinine, Ser: 0.7 mg/dL (ref 0.44–1.00)
GFR, Estimated: >60 mL/min (ref >60)
Glucose, Bld: 95 mg/dL (ref 70–99)
Potassium: 3.4 mmol/L — ABNORMAL LOW (ref 3.5–5.1)
Sodium: 134 mmol/L — ABNORMAL LOW (ref 135–145)

## 2021-03-09 LAB — RESP PANEL BY RT-PCR (FLU A&B, COVID) ARPGX2
Influenza A by PCR: NEGATIVE
Influenza B by PCR: NEGATIVE
SARS Coronavirus 2 by RT PCR: POSITIVE — AB

## 2021-03-09 MED ORDER — SODIUM CHLORIDE 0.9 % IV SOLN
1000.0000 mg | Freq: Once | INTRAVENOUS | Status: AC
  Administered 2021-03-09: 1000 mg via INTRAVENOUS
  Filled 2021-03-09: qty 8

## 2021-03-09 NOTE — ED Provider Notes (Signed)
[facility] EMERGENCY DEPARTMENT Provider Note   CSN: [id number] Arrival date & time: 03/09/21  1518     History Chief Complaint  Patient presents with   Weakness    Toni Weaver is a 30 y.o. female with a past medical history of MS that is presenting for left leg weakness since Thursday.  Patient states that she received the shingles vaccine on Thursday.  Since getting the vaccine she has felt left leg weakness.  She had an episode of urinary incontinence yesterday.  She has been complaining of a headache and nausea.  Patient denies any changes in sensation to her left leg.   Patient denies any fevers, chills, chest pain, shortness of breath, nausea, vomiting, diarrhea, numbness.    Weakness Severity:  Mild Duration:  1 day Context comment:  Hx of MS Associated symptoms: difficulty walking   Associated symptoms: no abdominal pain, no anorexia, no aphasia, no arthralgias, no ataxia, no chest pain, no cough, no diarrhea, no dizziness, no dysuria, no numbness in extremities, no falls, no fever, no frequency, no headaches, no myalgias, no nausea, no seizures, no shortness of breath, no urgency and no vomiting   Risk factors: neurologic disease, new medications and recent stressors       Past Medical History:  Diagnosis Date   Abnormality of gait 11/05/2015   Multiple sclerosis (HCC) 11/24/2012   Optic neuritis 11/24/2012    Patient Active Problem List   Diagnosis Date Noted   Hypokalemia 03/10/2021   COVID-19 virus infection 03/10/2021   Multiple sclerosis exacerbation (HCC) 03/09/2021   Raynaud's disease without gangrene 12/24/2020   Spasticity 12/24/2020   High risk medication use 06/11/2020   Left foot drop 06/11/2020   Abnormality of gait 11/05/2015   Multiple sclerosis (HCC) 11/24/2012   Optic neuritis 11/24/2012    Past Surgical History:  Procedure Laterality Date   APPENDECTOMY       OB History   No obstetric history on file.     Family  History  Problem Relation Age of Onset   Hypertension Mother    Hypertension Father    Cancer Maternal Grandmother        Breast cancer    Social History   Tobacco Use   Smoking status: Never   Smokeless tobacco: Never  Substance Use Topics   Alcohol use: No   Drug use: No    Home Medications Prior to Admission medications   Medication Sig Start Date End Date Taking? Authorizing Provider  acetaminophen (TYLENOL) 500 MG tablet Take 500 mg by mouth every 6 (six) hours as needed for moderate pain.   Yes [provider]  baclofen (LIORESAL) 10 MG tablet Take 1 tablet (10 mg total) by mouth 3 (three) times daily. 11/06/20  Yes Sater, Pearletha Furl, MD  diclofenac Sodium (VOLTAREN) 1 % GEL Apply 4 g topically 4 (four) times daily as needed. 10/26/20  Yes [provider]  escitalopram (LEXAPRO) 10 MG tablet Take 10 mg by mouth daily. 03/09/21  Yes [provider]  KESIMPTA 20 MG/0.4ML SOAJ Inject 20 mg into the skin See admin instructions. Every 4 weeks 02/15/21  Yes [provider]  Multiple Vitamin (MULTIVITAMIN) tablet Take 1 tablet by mouth daily.   Yes [provider]  ondansetron (ZOFRAN ODT) 4 MG disintegrating tablet Take 1 tablet (4 mg total) by mouth every 8 (eight) hours as needed for nausea or vomiting. 03/27/15  Yes York Spaniel, MD  tiZANidine (ZANAFLEX) 2 MG  tablet TAKE 1 TABLET BY MOUTH AT BEDTIME Patient taking differently: Take 2 mg by mouth at bedtime as needed for muscle spasms. 09/24/20  Yes Sater, Pearletha Furl, MD  verapamil (CALAN-SR) 120 MG CR tablet Take 1 tablet (120 mg total) by mouth at bedtime. 11/06/20  Yes Sater, Pearletha Furl, MD  albuterol (VENTOLIN HFA) 108 (90 Base) MCG/ACT inhaler Inhale 2 puffs into the lungs every 6 (six) hours as needed for wheezing or shortness of breath. Patient not taking: No sig reported 12/26/20   Jannifer Rodney A, FNP  benzonatate (TESSALON PERLES) 100 MG capsule Take 1 capsule (100 mg total) by mouth  3 (three) times daily as needed. Patient not taking: No sig reported 12/26/20   Jannifer Rodney A, FNP  fluticasone (FLONASE) 50 MCG/ACT nasal spray Place 2 sprays into both nostrils daily. Patient not taking: No sig reported 12/26/20   Jannifer Rodney A, FNP  methylPREDNISolone (MEDROL DOSEPAK) 4 MG TBPK tablet 6 pills po x 1 day, then 5 pills po x 1 day, then 4 pills po x 1 day, then 3 pills po x 1 day, then 2 pills po x 1 day, then 1 pill po x 1 day Patient not taking: No sig reported 11/06/20   Asa Lente, MD  predniSONE (DELTASONE) 50 MG tablet 12 pills (600 mg) po qd x 3 days Patient not taking: No sig reported 12/24/20   Asa Lente, MD    Allergies    Patient has no known allergies.  Review of Systems   Review of Systems  Constitutional:  Negative for chills, diaphoresis, fatigue and fever.  HENT:  Negative for congestion, dental problem, ear discharge, ear pain, facial swelling, hearing loss, nosebleeds, postnasal drip, rhinorrhea, sinus pain, sneezing, sore throat and trouble swallowing.   Eyes:  Negative for pain and visual disturbance.  Respiratory:  Negative for cough, chest tightness, shortness of breath, wheezing and stridor.   Cardiovascular:  Negative for chest pain, palpitations and leg swelling.  Gastrointestinal:  Negative for abdominal distention, abdominal pain, anorexia, blood in stool, constipation, diarrhea, nausea and vomiting.  Endocrine: Negative for polydipsia and polyuria.  Genitourinary:  Negative for difficulty urinating, dysuria, flank pain, frequency, hematuria, urgency, vaginal bleeding and vaginal discharge.  Musculoskeletal:  Negative for arthralgias, falls, myalgias, neck pain and neck stiffness.  Skin:  Negative for rash and wound.  Allergic/Immunologic: Negative for environmental allergies and food allergies.  Neurological:  Positive for weakness. Negative for dizziness, seizures, syncope, facial asymmetry, speech difficulty, light-headedness,  numbness and headaches.  Psychiatric/Behavioral:  Negative for agitation, behavioral problems and confusion.    Physical Exam Updated Vital Signs BP 104/73   Pulse 71   Temp 98.3 F (36.8 C) (Oral)   Resp 18   Ht 5\' 3"  (1.6 m)   Wt 61.2 kg   LMP 02/23/2021 (Approximate)   SpO2 100%   BMI 23.91 kg/m   Physical Exam Vitals and nursing note reviewed.  Constitutional:      General: She is not in acute distress.    Appearance: Normal appearance. She is normal weight. She is not ill-appearing.  HENT:     Head: Normocephalic and atraumatic.     Right Ear: External ear normal.     Left Ear: External ear normal.     Nose: Nose normal. No congestion.     Mouth/Throat:     Mouth: Mucous membranes are moist.     Pharynx: Oropharynx is clear. No oropharyngeal exudate or posterior oropharyngeal erythema.  Eyes:     General: No visual field deficit.    Extraocular Movements: Extraocular movements intact.     Conjunctiva/sclera: Conjunctivae normal.     Pupils: Pupils are equal, round, and reactive to light.  Neck:     Vascular: No carotid bruit.  Cardiovascular:     Rate and Rhythm: Normal rate and regular rhythm.     Pulses: Normal pulses.     Heart sounds: Normal heart sounds. No murmur heard. Pulmonary:     Effort: Pulmonary effort is normal. No respiratory distress.     Breath sounds: Normal breath sounds. No stridor. No wheezing, rhonchi or rales.  Chest:     Chest wall: No tenderness.  Abdominal:     General: Bowel sounds are normal. There is no distension.     Palpations: Abdomen is soft.     Tenderness: There is no abdominal tenderness. There is no right CVA tenderness, left CVA tenderness, guarding or rebound.  Musculoskeletal:        General: No swelling or tenderness. Normal range of motion.     Cervical back: Normal range of motion and neck supple. No rigidity, tenderness or bony tenderness.     Thoracic back: Normal. No tenderness or bony tenderness.     Lumbar  back: Normal. No tenderness or bony tenderness.     Right lower leg: No edema.     Left lower leg: No edema.  Skin:    General: Skin is warm and dry.     Coloration: Skin is not jaundiced.  Neurological:     General: No focal deficit present.     Mental Status: She is alert and oriented to person, place, and time. Mental status is at baseline.     Cranial Nerves: Cranial nerves are intact. No cranial nerve deficit, dysarthria or facial asymmetry.     Sensory: Sensation is intact. No sensory deficit.     Motor: Motor function is intact. No weakness.     Coordination: Coordination is intact. Finger-Nose-Finger Test normal.     Gait: Gait is intact. Gait normal.     Comments: Left leg weakness compared to the right leg. No changes in sensation.   Psychiatric:        Mood and Affect: Mood normal.        Behavior: Behavior normal.        Thought Content: Thought content normal.        Judgment: Judgment normal.    ED Results / Procedures / Treatments   Labs (all labs ordered are listed, but only abnormal results are displayed) Labs Reviewed  RESP PANEL BY RT-PCR (FLU A&B, COVID) ARPGX2 - Abnormal; Notable for the following components:      Result Value   SARS Coronavirus 2 by RT PCR POSITIVE (*)    All other components within normal limits  CBC - Abnormal; Notable for the following components:   Hemoglobin 11.7 (*)    HCT 32.8 (*)    MCV 79.0 (*)    All other components within normal limits  BASIC METABOLIC PANEL - Abnormal; Notable for the following components:   Sodium 134 (*)    Potassium 3.4 (*)    All other components within normal limits  MAGNESIUM  PHOSPHORUS  VITAMIN D 25 HYDROXY (VIT D DEFICIENCY, FRACTURES)  HEPATIC FUNCTION PANEL  CBC WITH DIFFERENTIAL/PLATELET  C-REACTIVE PROTEIN  D-DIMER, QUANTITATIVE  FERRITIN  LACTATE DEHYDROGENASE  HIV ANTIBODY (ROUTINE TESTING W REFLEX)  TSH  COMPREHENSIVE METABOLIC PANEL  MAGNESIUM  PHOSPHORUS  CBC WITH  DIFFERENTIAL/PLATELET    EKG None  Radiology MR Brain W and Wo Contrast  Result Date: 03/10/2021 CLINICAL DATA:  Multiple sclerosis EXAM: MRI HEAD WITHOUT AND WITH CONTRAST TECHNIQUE: Multiplanar, multiecho pulse sequences of the brain and surrounding structures were obtained without and with intravenous contrast. CONTRAST:  6mL GADAVIST GADOBUTROL 1 MMOL/ML IV SOLN COMPARISON:  09/17/2020 FINDINGS: Brain: No acute infarct, mass effect or extra-axial collection. No acute or chronic hemorrhage. Unchanged distribution white matter lesions in a pattern consistent with chronic demyelination. There are no contrast-enhancing lesions to indicate active demyelination. There is no age advanced volume loss. Unchanged left middle cerebellar peduncle lesion. The midline structures are normal. Vascular: Major flow voids are preserved. Skull and upper cervical spine: Normal calvarium and skull base. Visualized upper cervical spine and soft tissues are normal. Sinuses/Orbits:No paranasal sinus fluid levels or advanced mucosal thickening. No mastoid or middle ear effusion. Normal orbits. IMPRESSION: Unchanged distribution of white matter lesions in a pattern consistent with chronic demyelination. No contrast-enhancing lesions to indicate active demyelination. Electronically Signed   By: Deatra RobinsonKevin  Herman M.D.   On: 03/10/2021 01:33    Procedures Procedures   Medications Ordered in ED Medications  baclofen (LIORESAL) tablet 10 mg (has no administration in time range)  escitalopram (LEXAPRO) tablet 10 mg (has no administration in time range)  verapamil (CALAN-SR) CR tablet 120 mg (has no administration in time range)  tiZANidine (ZANAFLEX) tablet 2 mg (has no administration in time range)  acetaminophen (TYLENOL) tablet 650 mg (has no administration in time range)    Or  acetaminophen (TYLENOL) suppository 650 mg (has no administration in time range)  HYDROcodone-acetaminophen (NORCO/VICODIN) 5-325 MG per tablet 1-2  tablet (has no administration in time range)  heparin injection 5,000 Units (5,000 Units Subcutaneous Not Given 03/10/21 0123)  sodium chloride flush (NS) 0.9 % injection 3 mL (3 mLs Intravenous Not Given 03/10/21 0123)  sodium chloride flush (NS) 0.9 % injection 3 mL (has no administration in time range)  0.9 %  sodium chloride infusion (has no administration in time range)  nirmatrelvir/ritonavir EUA (PAXLOVID) TABS 3 tablet (3 tablets Oral Not Given 03/10/21 0027)  methylPREDNISolone sodium succinate (SOLU-MEDROL) 1,000 mg in sodium chloride 0.9 % 50 mL IVPB (0 mg Intravenous Stopped 03/10/21 0010)  potassium chloride SA (KLOR-CON) CR tablet 40 mEq (40 mEq Oral Given 03/10/21 0126)  gadobutrol (GADAVIST) 1 MMOL/ML injection 6 mL (6 mLs Intravenous Contrast Given 03/10/21 0118)    ED Course  I have reviewed the triage vital signs and the nursing notes.  Pertinent labs & imaging results that were available during my care of the patient were reviewed by me and considered in my medical decision making (see chart for details).    MDM Rules/Calculators/A&P                         Toni Weaver is a 30 y.o. female with a past medical history of MS that is presenting with left leg weakness and urinary incontinence since receiving the shingles vaccine. Patient sent by neurologist to the ED. Neurology consulted and recommended MRI and IV steroids. IV steroid given. CBC, BMP, covid performed, MRI ordered.  Patient states compliance and understanding of the plan. I explained labs and imaging to the patient. No further questions at this time from the patient.  Patient admitted to hospitalist service.   The plan for this patient was discussed with Dr. Silverio LayYao,  who voiced agreement and who oversaw evaluation and treatment of this patient.   Final Clinical Impression(s) / ED Diagnoses Final diagnoses:  Weakness  MS (multiple sclerosis) (HCC)    Rx / DC Orders ED Discharge Orders     None        Lottie Dawson, MD 03/10/21 0209    Charlynne Pander, MD 03/10/21 [phone]

## 2021-03-09 NOTE — H&P (Signed)
Toni Weaver ZOX:[id number] DOB: 04-18-1991 DOA: 03/09/2021  PCP: Harvie HeckJackson, Mishi, MD   Outpatient Specialists:   NEurology    Dr. Dalhmier Patient arrived to ER on 03/09/21 at 1518 Referred by Attending Charlynne PanderYao, David Hsienta, MD  Patient coming from: home Lives With family   Chief Complaint:  Chief Complaint  Patient presents with   Weakness    HPI: Toni Weaver is a 30 y.o. female with medical history significant of MS    Presented with   bilateral leg weakness worse than baseline and urinary incontinence Pt had Shingles vaccine few days ago and since then have had  worsening headache, weakness, episode of incontinence. Denies any fever no cough   Has  been vaccinated against COVID and boosted   Initial COVID TEST   POSITIVE,   Lab Results  Component Value Date   SARSCOV2NAA POSITIVE (A) 03/09/2021   Regarding pertinent Chronic problems:   MS on Kesimptra injections    While in ER: Given 1 gm IV solumedrol as per neurology consult Pt tested positive for COVID given high risk ordered PAxlovid     ED Triage Vitals  Enc Vitals Group     BP 03/09/21 1531 105/75     Pulse Rate 03/09/21 1531 74     Resp 03/09/21 1531 16     Temp 03/09/21 1531 98.5 F (36.9 C)     Temp Source 03/09/21 1531 Oral     SpO2 03/09/21 1531 100 %     Weight 03/09/21 1531 135 lb (61.2 kg)     Height 03/09/21 1531 5\' 3"  (1.6 m)     Head Circumference --      Peak Flow --      Pain Score 03/09/21 2140 0     Pain Loc --      Pain Edu? --      Excl. in GC? --   TMAX(24)@     _________________________________________ Significant initial  Findings: Abnormal Labs Reviewed  CBC - Abnormal; Notable for the following components:      Result Value   Hemoglobin 11.7 (*)    HCT 32.8 (*)    MCV 79.0 (*)    All other components within normal limits  BASIC METABOLIC PANEL - Abnormal; Notable for the following components:   Sodium 134 (*)    Potassium 3.4 (*)    All other components  within normal limits   ____________________________________________  MRI pending CXR Ordered     The recent clinical data is shown below. Vitals:   03/09/21 1531 03/09/21 1824 03/09/21 2103 03/09/21 2230  BP: 105/75 100/69 100/67 106/72  Pulse: 74 64 67 68  Resp: 16 16 18 18   Temp: 98.5 F (36.9 C)  98.6 F (37 C) 98.3 F (36.8 C)  TempSrc: Oral   Oral  SpO2: 100% 97% 98% 99%  Weight:      Height:        WBC     Component Value Date/Time   WBC 6.2 03/09/2021 1543   LYMPHSABS 3.3 (H) 11/06/2020 0844   EOSABS 0.1 11/06/2020 0844   BASOSABS 0.1 11/06/2020 0844      UA  not ordered     No results found for this or any previous visit.   _______________________________________________________ ER Provider Called:   neurology  Dr.KirkPatrick They Recommend admit to medicine    SEEN in ER _______________________________________________ Hospitalist was called for admission for MS exacerbation and covid infection  The following Work up  has been ordered so far:  Orders Placed This Encounter  Procedures   Resp Panel by RT-PCR (Flu A&B, Covid) Nasopharyngeal Swab   MR Brain W and Wo Contrast   CBC   Basic metabolic panel   Consult to neurology   Consult for [facility] Admission   Airborne and Contact precautions      Following Medications were ordered in ER: Medications  methylPREDNISolone sodium succinate (SOLU-MEDROL) 1,000 mg in sodium chloride 0.9 % 50 mL IVPB (has no administration in time range)       OTHER Significant initial  Findings:  labs showing:    Recent Labs  Lab 03/09/21 1543  NA 134*  K 3.4*  CO2 27  GLUCOSE 95  BUN 11  CREATININE 0.70  CALCIUM 8.9    Cr  * stable,  Up from baseline see below Lab Results  Component Value Date   CREATININE 0.70 03/09/2021   CREATININE 0.71 10/25/2019   CREATININE 0.63 10/24/2019    No results for input(s): AST, ALT, ALKPHOS, BILITOT, PROT, ALBUMIN in the last 168 hours. Lab Results   Component Value Date   CALCIUM 8.9 03/09/2021    Plt: Lab Results  Component Value Date   PLT 292 03/09/2021         Recent Labs  Lab 03/09/21 1543  WBC 6.2  HGB 11.7*  HCT 32.8*  MCV 79.0*  PLT 292    HG/HCT stable     Component Value Date/Time   HGB 11.7 (L) 03/09/2021 1543   HGB 13.7 11/06/2020 0844   HCT 32.8 (L) 03/09/2021 1543   HCT 41.1 11/06/2020 0844   MCV 79.0 (L) 03/09/2021 1543   MCV 84 11/06/2020 0844     Radiological Exams on Admission: No results found. _______________________________________________________________________________________________________ Latest  Blood pressure 106/72, pulse 68, temperature 98.3 F (36.8 C), temperature source Oral, resp. rate 18, height 5\' 3"  (1.6 m), weight 61.2 kg, last menstrual period 02/23/2021, SpO2 99 %.   Review of Systems:    Pertinent positives include: left leg weakness localizing neurological complaints Constitutional:  No weight loss, night sweats, Fevers, chills, fatigue, weight loss  HEENT:  No headaches, Difficulty swallowing,Tooth/dental problems,Sore throat,  No sneezing, itching, ear ache, nasal congestion, post nasal drip,  Cardio-vascular:  No chest pain, Orthopnea, PND, anasarca, dizziness, palpitations.no Bilateral lower extremity swelling  GI:  No heartburn, indigestion, abdominal pain, nausea, vomiting, diarrhea, change in bowel habits, loss of appetite, melena, blood in stool, hematemesis Resp:  no shortness of breath at rest. No dyspnea on exertion, No excess mucus, no productive cough, No non-productive cough, No coughing up of blood.No change in color of mucus.No wheezing. Skin:  no rash or lesions. No jaundice GU:  no dysuria, change in color of urine, no urgency or frequency. No straining to urinate.  No flank pain.  Musculoskeletal:  No joint pain or no joint swelling. No decreased range of motion. No back pain.  Psych:  No change in mood or affect. No depression or anxiety.  No memory loss.  Neuro: no , no tingling, no weakness, no double vision, no gait abnormality, no slurred speech, no confusion  All systems reviewed and apart from HOPI all are negative _______________________________________________________________________________________________ Past Medical History:   Past Medical History:  Diagnosis Date   Abnormality of gait 11/05/2015   Multiple sclerosis (HCC) 11/24/2012   Optic neuritis 11/24/2012      Past Surgical History:  Procedure Laterality Date   APPENDECTOMY  Social History:  Ambulatory   independently or cane,      reports that she has never smoked. She has never used smokeless tobacco. She reports that she does not drink alcohol and does not use drugs.   Family History:   Family History  Problem Relation Age of Onset   Hypertension Mother    Hypertension Father    Cancer Maternal Grandmother        Breast cancer   ______________________________________________________________________________________________ Allergies: No Known Allergies   Prior to Admission medications   Medication Sig Start Date End Date Taking? Authorizing Provider  albuterol (VENTOLIN HFA) 108 (90 Base) MCG/ACT inhaler Inhale 2 puffs into the lungs every 6 (six) hours as needed for wheezing or shortness of breath. 12/26/20   Jannifer Rodney A, FNP  baclofen (LIORESAL) 10 MG tablet Take 1 tablet (10 mg total) by mouth 3 (three) times daily. 11/06/20   Sater, Pearletha Furl, MD  benzonatate (TESSALON PERLES) 100 MG capsule Take 1 capsule (100 mg total) by mouth 3 (three) times daily as needed. 12/26/20   Junie Spencer, FNP  fluticasone (FLONASE) 50 MCG/ACT nasal spray Place 2 sprays into both nostrils daily. 12/26/20   Junie Spencer, FNP  methylPREDNISolone (MEDROL DOSEPAK) 4 MG TBPK tablet 6 pills po x 1 day, then 5 pills po x 1 day, then 4 pills po x 1 day, then 3 pills po x 1 day, then 2 pills po x 1 day, then 1 pill po x 1 day 11/06/20   Asa Lente,  MD  Multiple Vitamin (MULTIVITAMIN) tablet Take 1 tablet by mouth daily.    [provider]  Natalizumab (TYSABRI IV) Inject into the vein.    [provider]  ondansetron (ZOFRAN ODT) 4 MG disintegrating tablet Take 1 tablet (4 mg total) by mouth every 8 (eight) hours as needed for nausea or vomiting. 03/27/15   York Spaniel, MD  predniSONE (DELTASONE) 50 MG tablet 12 pills (600 mg) po qd x 3 days 12/24/20   Asa Lente, MD  tiZANidine (ZANAFLEX) 2 MG tablet TAKE 1 TABLET BY MOUTH AT BEDTIME 09/24/20   Sater, Pearletha Furl, MD  verapamil (CALAN-SR) 120 MG CR tablet Take 1 tablet (120 mg total) by mouth at bedtime. 11/06/20   Asa Lente, MD    ___________________________________________________________________________________________________ Physical Exam: Vitals with BMI 03/09/2021 03/09/2021 03/09/2021  Height - - -  Weight - - -  BMI - - -  Systolic 106 100 884  Diastolic 72 67 69  Pulse 68 67 64     1. General:  in No  Acute distress   well   -appearing 2. Psychological: Alert and   Oriented 3. Head/ENT:    Dry Mucous Membranes                          Head Non traumatic, neck supple                          Normal  Dentition 4. SKIN decreased Skin turgor,  Skin clean Dry and intact no rash 5. Heart: Regular rate and rhythm no  Murmur, no Rub or gallop 6. Lungs:  Clear to auscultation bilaterally, no wheezes or crackles   7. Abdomen: Soft,  non-tender, Non distended  8. Lower extremities: no clubbing, cyanosis, no  edema 9. Neurologically left lower ext weakness noted Otherwise intact 10. MSK: Normal range of motion  Chart has been reviewed  ______________________________________________________________________________________________  Assessment/Plan 30 y.o. female with medical history significant of MS   Admitted for MS exacerbation and Covid infection   Present on Admission:  Multiple sclerosis exacerbation (HCC) - Discussed at length with  Neurology, after careful considerate plan for Solumedrol 1gm IV now , obtain MRI and reassess   Hypokalemia - will replace   COVID-19 virus infection - COVID infection -incidental finding patient is   vaccinated   boosted  So far mild URI symptoms no evidence of hypoxia  Start paxlovid if no contraindications due to risk of chronic illness and steroid use   Obtain CXR No pulmonary complaints, no  hypoxia No indication for immunomodulators Obtain inflammatory markers  Supportive measures Avoid over aggressive fluid resuscitation Airborne precautions   Other plan as per orders.  DVT prophylaxis:  hep [place]      Code Status:    Code Status: Not on file FULL CODE  as per patient   I had personally discussed CODE STATUS with patient     Family Communication:   Family at  Bedside  plan of care was discussed  with    mother  Disposition Plan:    To home once workup is complete and patient is stable   Following barriers for discharge:                            Electrolytes corrected                                                         Will need consultants to evaluate patient prior to discharge                       Would benefit from PT/OT eval prior to DC  Ordered                     Consults called: neurology   Admission status:  ED Disposition     ED Disposition  Admit   Condition  --   Comment  Hospital Area: [facility] [[id number]]  Level of Care: Telemetry Medical [104]  May place patient in observation at [facility] or [facility] if equivalent level of care is available:: No  Covid Evaluation: Asymptomatic Screening Protocol (No Symptoms)  Diagnosis: Multiple sclerosis exacerbation [facility]) [[id number]]  Admitting Physician: Therisa Doyne [[id number]]  Attending Physician: Therisa Doyne [[id number]]           Obs     Level of care    tele  For 1 24H      No results found for: SARSCOV2NAA   Precautions: admitted as  covid positive Airborne  and Contact precautions    PPE: Used by the provider:   N95  eye Goggles,  Gloves    Advaith Lamarque 03/09/2021, 12:15 AM    [facility]     after 2 AM please page floor coverage PA If 7AM-7PM, please contact the day team taking care of the patient using [email or web address]   Patient was evaluated in the context of the global COVID-19 pandemic, which necessitated consideration that the patient might be at risk for infection with the SARS-CoV-2 virus that causes COVID-19. Institutional protocols and algorithms that pertain to  the evaluation of patients at risk for COVID-19 are in a state of rapid change based on information released by regulatory bodies including the CDC and federal and state organizations. These policies and algorithms were followed during the patient's care.

## 2021-03-09 NOTE — ED Triage Notes (Signed)
Pt states shingles vaccine on Thursday.  Friday am pt was getting out of car and urinated on herself.  States bil leg weakness, greater than normal (pt has MS).  Her provider called her today and stated to come to ED, that "the vaccine is causing MS flareup).

## 2021-03-09 NOTE — ED Provider Notes (Signed)
Emergency Medicine Provider Triage Evaluation Note  Toni Weaver , a 30 y.o. female  was evaluated in triage.  Pt complains of leg weakness, patient with history of MS, states that this is greater than normal.  She had a shingles vaccine on Thursday. Since then worsening headache, weakness, episode of incontinence. Was told to come to the ER for steroids, concern for new lesion  Discussed the case with Dr. Karene Fry, neurologist had reached out to him and had requested that the patient be admitted for IVIG.  Review of Systems  Positive: As above  Negative: As above   Physical Exam  BP 105/75 (BP Location: Right Arm)   Pulse 74   Temp 98.5 F (36.9 C) (Oral)   Resp 16   Ht 5\' 3"  (1.6 m)   Wt 61.2 kg   LMP 02/23/2021 (Approximate)   SpO2 100%   BMI 23.91 kg/m  Gen:   Awake, no distress   Resp:  Normal effort  MSK:   Moves extremities without difficulty  Other:    Medical Decision Making  Medically screening exam initiated at 3:35 PM.  Appropriate orders placed.  02/25/2021 L Mask was informed that the remainder of the evaluation will be completed by another provider, this initial triage assessment does not replace that evaluation, and the importance of remaining in the ED until their evaluation is complete.     [place], PA-C 03/09/21 1544    05/09/21, MD 03/09/21 2145

## 2021-03-10 ENCOUNTER — Observation Stay (HOSPITAL_COMMUNITY): Payer: Managed Care, Other (non HMO)

## 2021-03-10 DIAGNOSIS — Z79899 Other long term (current) drug therapy: Secondary | ICD-10-CM | POA: Diagnosis not present

## 2021-03-10 DIAGNOSIS — E876 Hypokalemia: Secondary | ICD-10-CM | POA: Diagnosis present

## 2021-03-10 DIAGNOSIS — I7300 Raynaud's syndrome without gangrene: Secondary | ICD-10-CM | POA: Diagnosis present

## 2021-03-10 DIAGNOSIS — F32A Depression, unspecified: Secondary | ICD-10-CM | POA: Diagnosis present

## 2021-03-10 DIAGNOSIS — U071 COVID-19: Secondary | ICD-10-CM | POA: Diagnosis present

## 2021-03-10 DIAGNOSIS — D849 Immunodeficiency, unspecified: Secondary | ICD-10-CM | POA: Diagnosis present

## 2021-03-10 DIAGNOSIS — M62838 Other muscle spasm: Secondary | ICD-10-CM | POA: Diagnosis present

## 2021-03-10 DIAGNOSIS — G35 Multiple sclerosis: Secondary | ICD-10-CM | POA: Diagnosis present

## 2021-03-10 DIAGNOSIS — R32 Unspecified urinary incontinence: Secondary | ICD-10-CM | POA: Diagnosis present

## 2021-03-10 DIAGNOSIS — T50Z95A Adverse effect of other vaccines and biological substances, initial encounter: Secondary | ICD-10-CM | POA: Diagnosis present

## 2021-03-10 DIAGNOSIS — R531 Weakness: Secondary | ICD-10-CM | POA: Diagnosis present

## 2021-03-10 LAB — HIV ANTIBODY (ROUTINE TESTING W REFLEX): HIV Screen 4th Generation wRfx: NONREACTIVE

## 2021-03-10 LAB — COMPREHENSIVE METABOLIC PANEL
ALT: 14 U/L (ref 0–44)
AST: 18 U/L (ref 15–41)
Albumin: 3.6 g/dL (ref 3.5–5.0)
Alkaline Phosphatase: 36 U/L — ABNORMAL LOW (ref 38–126)
Anion gap: 6 (ref 5–15)
BUN: 12 mg/dL (ref 6–20)
CO2: 25 mmol/L (ref 22–32)
Calcium: 9 mg/dL (ref 8.9–10.3)
Chloride: 105 mmol/L (ref 98–111)
Creatinine, Ser: 0.67 mg/dL (ref 0.44–1.00)
GFR, Estimated: >60 mL/min (ref >60)
Glucose, Bld: 154 mg/dL — ABNORMAL HIGH (ref 70–99)
Potassium: 4.2 mmol/L (ref 3.5–5.1)
Sodium: 136 mmol/L (ref 135–145)
Total Bilirubin: 0.7 mg/dL (ref 0.3–1.2)
Total Protein: 6.5 g/dL (ref 6.5–8.1)

## 2021-03-10 LAB — CBC WITH DIFFERENTIAL/PLATELET
Abs Immature Granulocytes: 0 10*3/uL (ref 0.00–0.07)
Basophils Absolute: 0 10*3/uL (ref 0.0–0.1)
Basophils Relative: 0 %
Eosinophils Absolute: 0.3 10*3/uL (ref 0.0–0.5)
Eosinophils Relative: 5 %
HCT: 33.6 % — ABNORMAL LOW (ref 36.0–46.0)
Hemoglobin: 12.3 g/dL (ref 12.0–15.0)
Lymphocytes Relative: 5 %
Lymphs Abs: 0.3 10*3/uL — ABNORMAL LOW (ref 0.7–4.0)
MCH: 28.7 pg (ref 26.0–34.0)
MCHC: 36.6 g/dL — ABNORMAL HIGH (ref 30.0–36.0)
MCV: 78.3 fL — ABNORMAL LOW (ref 80.0–100.0)
Monocytes Absolute: 0.2 10*3/uL (ref 0.1–1.0)
Monocytes Relative: 3 %
Neutro Abs: 5 10*3/uL (ref 1.7–7.7)
Neutrophils Relative %: 87 %
Platelets: 279 10*3/uL (ref 150–400)
RBC: 4.29 MIL/uL (ref 3.87–5.11)
RDW: 13.3 % (ref 11.5–15.5)
WBC: 5.7 10*3/uL (ref 4.0–10.5)
nRBC: 0 % (ref 0.0–0.2)
nRBC: 0 /100 WBC

## 2021-03-10 LAB — FERRITIN: Ferritin: 44 ng/mL (ref 11–307)

## 2021-03-10 LAB — HEPATIC FUNCTION PANEL
ALT: 14 U/L (ref 0–44)
AST: 17 U/L (ref 15–41)
Albumin: 3.5 g/dL (ref 3.5–5.0)
Alkaline Phosphatase: 35 U/L — ABNORMAL LOW (ref 38–126)
Bilirubin, Direct: <0.1 mg/dL (ref 0.0–0.2)
Total Bilirubin: 0.7 mg/dL (ref 0.3–1.2)
Total Protein: 6.6 g/dL (ref 6.5–8.1)

## 2021-03-10 LAB — MAGNESIUM
Magnesium: 2 mg/dL (ref 1.7–2.4)
Magnesium: 1.9 mg/dL (ref 1.7–2.4)

## 2021-03-10 LAB — VITAMIN D 25 HYDROXY (VIT D DEFICIENCY, FRACTURES): Vit D, 25-Hydroxy: 27.95 ng/mL — ABNORMAL LOW (ref 30–100)

## 2021-03-10 LAB — PHOSPHORUS
Phosphorus: 2.5 mg/dL (ref 2.5–4.6)
Phosphorus: 2.5 mg/dL (ref 2.5–4.6)

## 2021-03-10 LAB — TSH: TSH: 2.006 u[IU]/mL (ref 0.350–4.500)

## 2021-03-10 LAB — LACTATE DEHYDROGENASE: LDH: 103 U/L (ref 98–192)

## 2021-03-10 LAB — C-REACTIVE PROTEIN: CRP: 0.9 mg/dL (ref <1.0)

## 2021-03-10 IMAGING — MR MR THORACIC SPINE WO/W CM
6 of 11 series · 17 of 48 positions shown · IV contrast (gadavist)
Comparison: MRI of the cervical spine from [DATE]

CLINICAL DATA: Spinal stenosis, C-spine CSF leak suspected/
Spontaneous intracranial hypotension; Myelopathy, acute or
progressive CSF leak/Spontaneous intracranial hypotension suspected

Additional history: Per chart review patient has a history of
multiple sclerosis with new left leg numbness and weakness after
recent zoster vaccine.
EXAM:
MRI CERVICAL AND THORACIC SPINE WITHOUT AND WITH CONTRAST
TECHNIQUE: Multiplanar and multiecho pulse sequences of the cervical spine, to
include the craniocervical junction and cervicothoracic junction,
and the thoracic spine, were obtained without and with intravenous
contrast.
CONTRAST:  6mL GADAVIST GADOBUTROL 1 MMOL/ML IV SOLN

[Series 18: T1 · sagittal · 3.3mm · 0.62mm/px · 1 of 8 slices shown (1 of 3)]
[im 1/8]
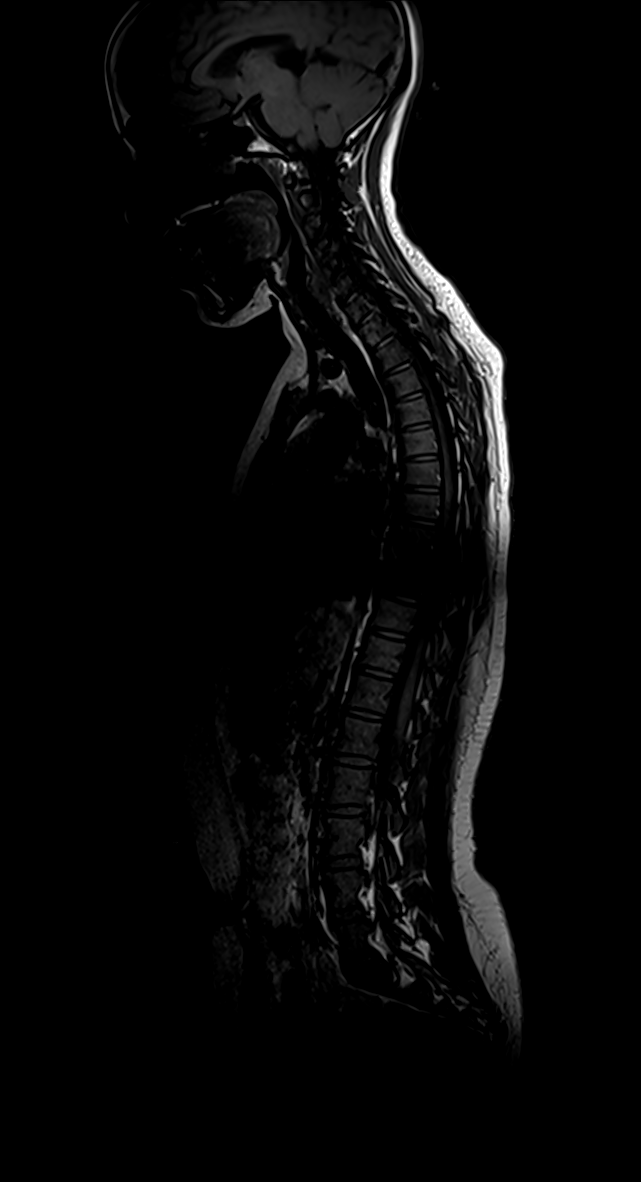

[Series 19: T2 · sagittal · 3.0mm · 0.76mm/px · 2 of 17 slices shown (1 of 2)]
[im 1/17]
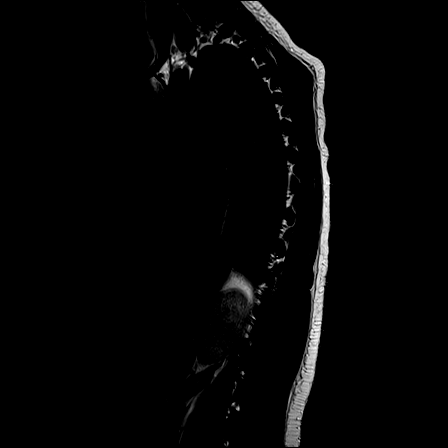
[im 17/17]
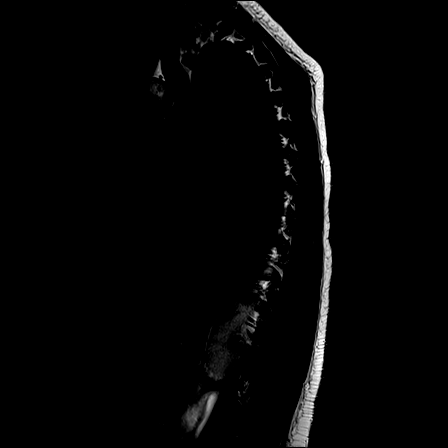

[Series 20: T1 · sagittal · 3.0mm · 0.76mm/px · 2 of 17 slices shown (2 of 3)]
[im 1/17]
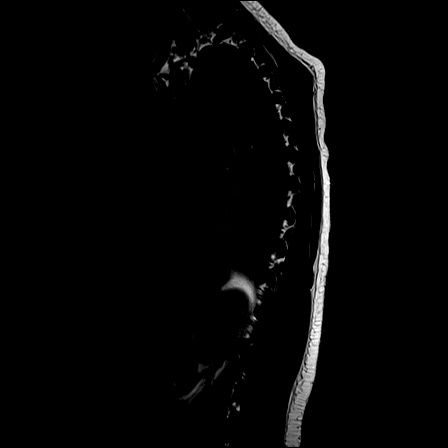
[im 17/17]
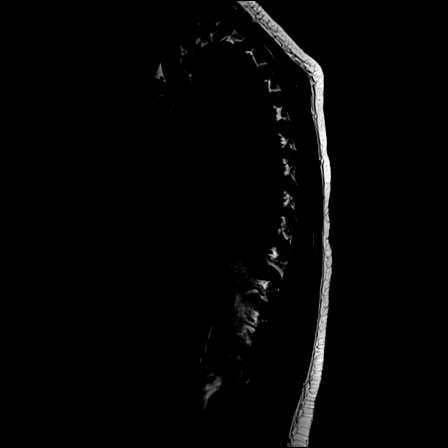

[Series 22: T2 · axial · 5.0mm · 0.59mm/px · z∈[-357,-138]mm · 5 of 39 slices shown (2 of 2)]
[im 1/39]
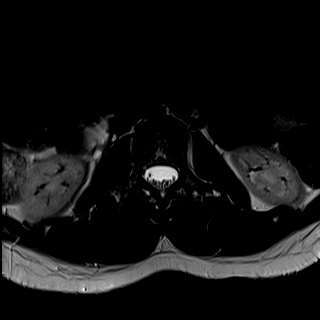
[im 10/39]
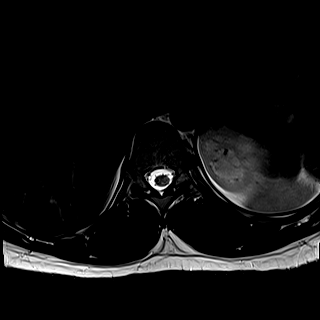
[im 20/39]
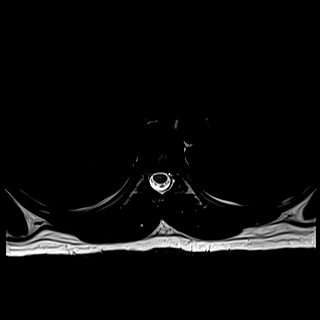
[im 29/39]
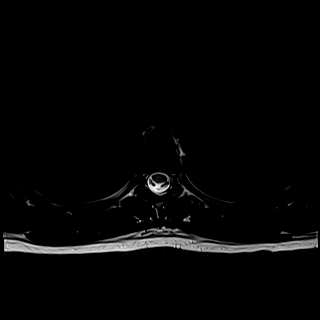
[im 39/39]
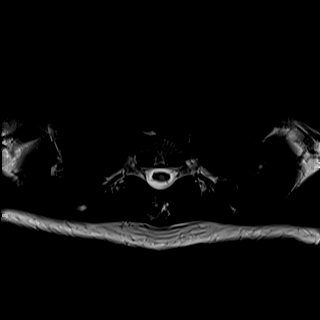

[Series 24: T1 · axial · non-contrast · 5.0mm · 0.31mm/px · z∈[-357,-138]mm · 5 of 39 slices shown (3 of 3)]
[im 1/39]
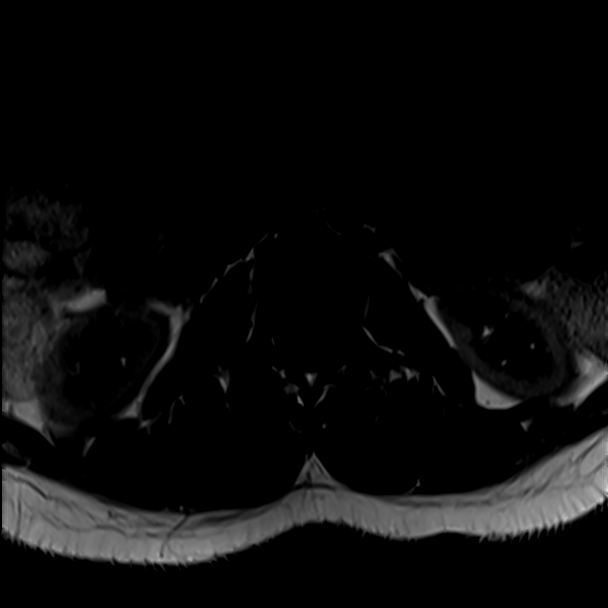
[im 10/39]
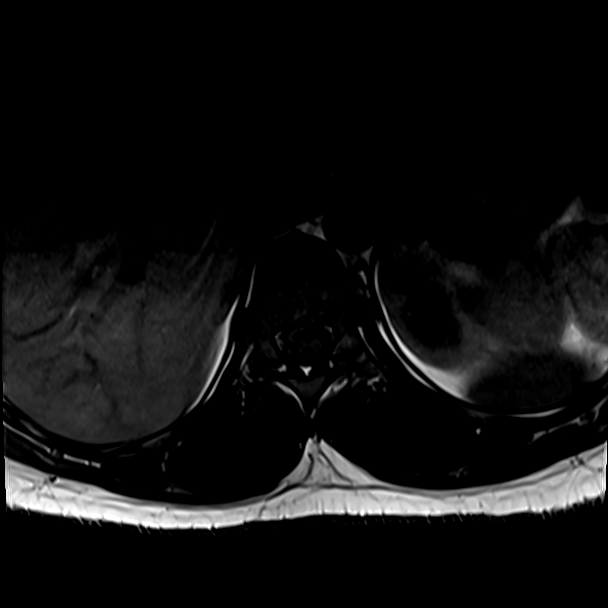
[im 20/39]
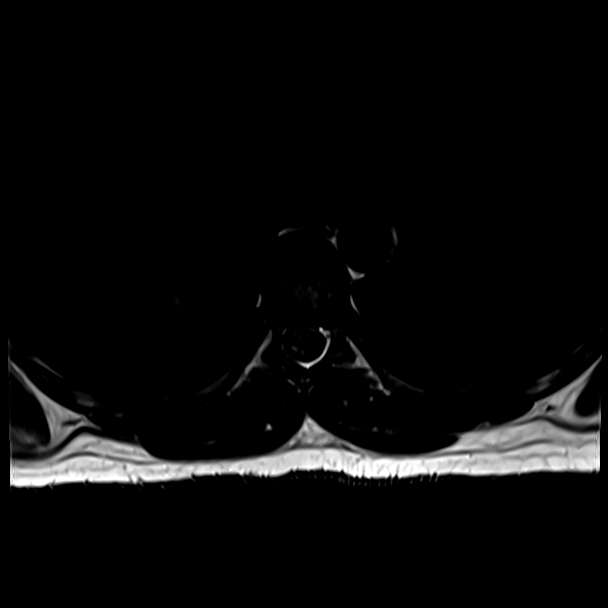
[im 29/39]
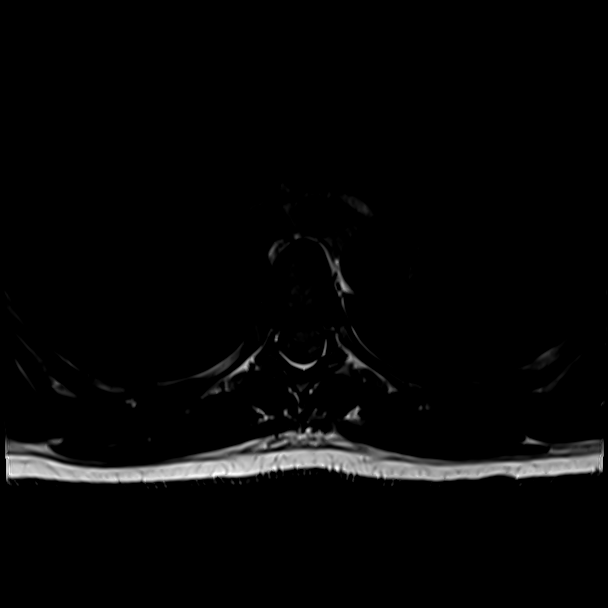
[im 39/39]
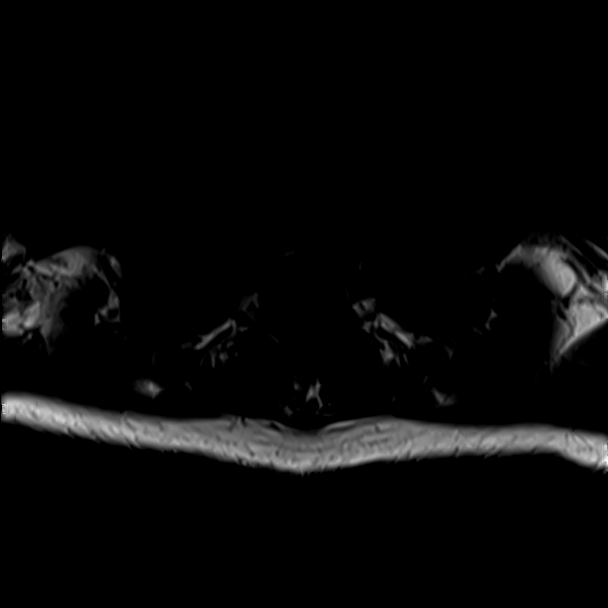

[Series 25: T1 fat-sat post-contrast · sagittal · 3.0mm · 0.76mm/px · 2 of 17 slices shown]
[im 1/17]
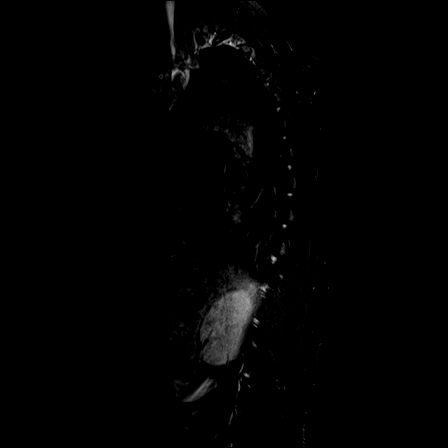
[im 17/17]
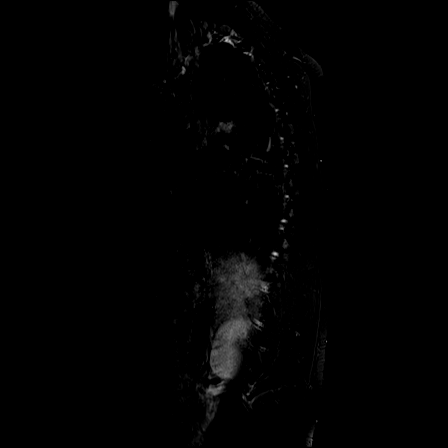

[17 of 48 positions shown; findings below may reference images not displayed]

FINDINGS: MRI CERVICAL SPINE FINDINGS

Alignment: No substantial sagittal subluxation.

Vertebrae: No evidence of acute fracture, discitis/osteomyelitis, or
suspicious bone lesion.

Cord: Numerous short-segment T2 hyperintense lesions throughout the
cervical cord, including lesions at C2-C3, C3-C4, C5-C6, and C7-T1.
No enhancing lesions. Mild cord atrophy at the cervicothoracic
junction, likely progressed.

Posterior Fossa, vertebral arteries, paraspinal tissues: Visualized
vertebral artery flow voids are maintained. See same day MRI head
for intracranial valuation.

Disc levels: No significant disc protrusion, foraminal stenosis, or
canal stenosis.

MRI THORACIC SPINE FINDINGS

Alignment: Mildly exaggerated thoracic kyphosis. No substantial
sagittal subluxation.

Vertebrae: Benign vertebral venous malformation at T12. No
suspicious bone lesions. No specific evidence of acute fracture or
discitis/osteomyelitis.

Cord: Numerous short-segment T2 hyperintensities in the cord,
including lesions at T2-T3, T3-T4, T4-T5, T6-T7, T10, T10-T11, and
T11-T12. Axial postcontrast imaging is somewhat limited by motion
without definite/convincing abnormal cord enhancement when
correlating with the good quality postcontrast sagittal T1.

Paraspinal and other soft tissues: Unremarkable. No paraspinal fluid
collections.

Disc levels: No significant disc protrusion, foraminal stenosis, or
canal stenosis.
IMPRESSION: MRI cervical spine:

1. Numerous short-segment T2 hyperintense lesions throughout the
cervical cord, detailed above and compatible with prior
demyelination. Many of these lesions are more conspicuous than on
the [JL] prior suggesting possible progression; however, today's
study is of significantly better quality and therefore direct
comparison is limited. No enhancing lesions to suggest active
demyelination.
2. Mild cord atrophy at the cervicothoracic junction, likely
progressed.

MRI thoracic spine:

Numerous short-segment T2 hyperintense cord lesions throughout the
thoracic cord, detailed above and compatible with prior
demyelination. No prior study to assess for change. No convincing
cord enhancement to suggest active demyelination.

## 2021-03-10 IMAGING — MR MR HEAD WO/W CM
14 of 17 series · 37 of 48 positions shown · IV contrast (gadavist)
Comparison: [DATE]

CLINICAL DATA: Multiple sclerosis

EXAM:
MRI HEAD WITHOUT AND WITH CONTRAST
TECHNIQUE: Multiplanar, multiecho pulse sequences of the brain and surrounding
structures were obtained without and with intravenous contrast.
CONTRAST:  6mL GADAVIST GADOBUTROL 1 MMOL/ML IV SOLN

[Series 5: DWI · axial · 3.0mm · 0.88mm/px · z∈[-140,-23]mm · 5 of 96 slices shown (1 of 4)]
[im 1/96]
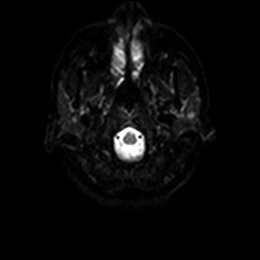
[im 24/96]
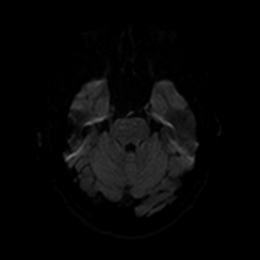
[im 48/96]
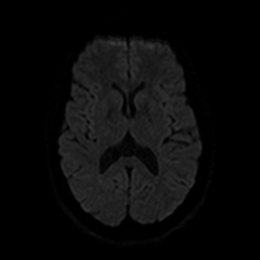
[im 72/96]
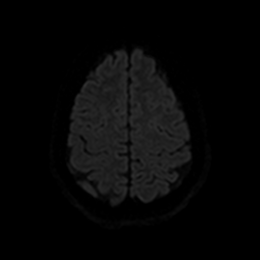
[im 96/96]
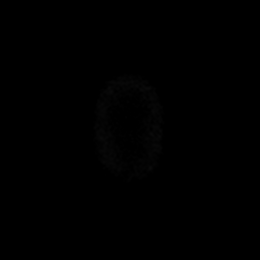

[Series 6: DWI · axial · 3.0mm · 0.88mm/px · z∈[-140,-23]mm · 3 of 48 slices shown (2 of 4)]
[im 1/48]
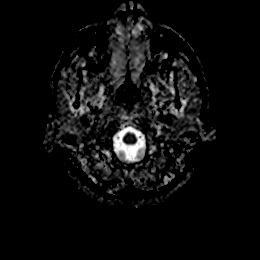
[im 24/48]
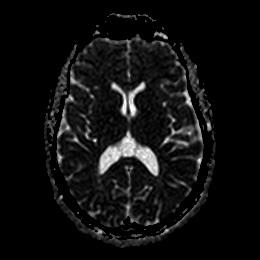
[im 48/48]
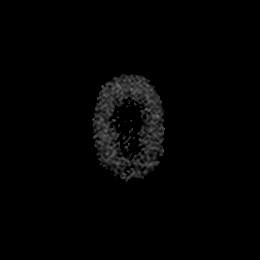

[Series 7: DWI · coronal · 4.0mm · 0.88mm/px · 4 of 70 slices shown (3 of 4)]
[im 1/70]
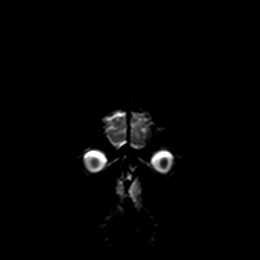
[im 24/70]
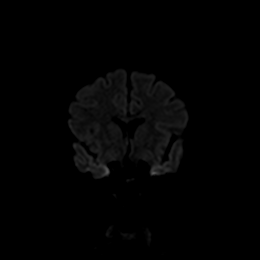
[im 47/70]
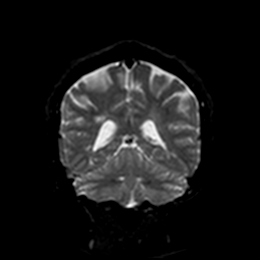
[im 70/70]
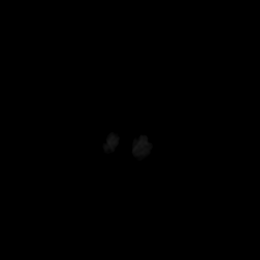

[Series 8: DWI · coronal · 4.0mm · 0.88mm/px · 2 of 35 slices shown (4 of 4)]
[im 1/35]
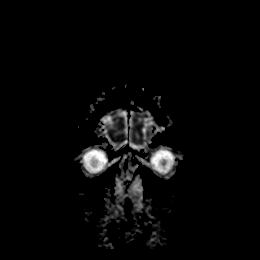
[im 35/35]
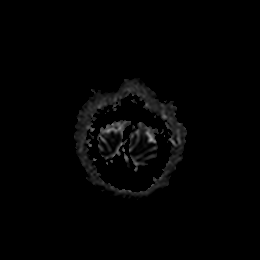

[Series 9: t2_space_dark-fluid_sag_p2_ns-ir · sagittal · 1.0mm · 0.49mm/px · 8 of 160 slices shown]
[im 1/160]
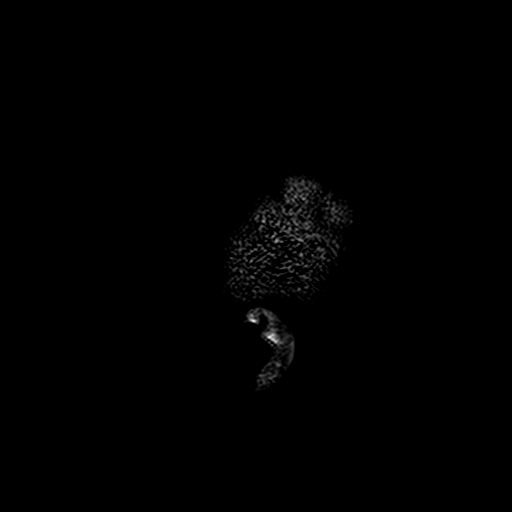
[im 23/160]
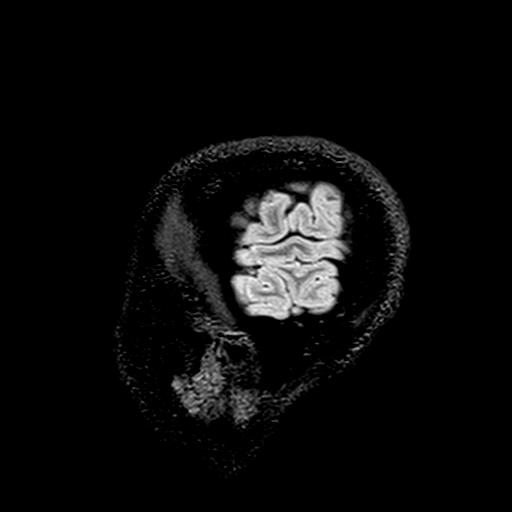
[im 46/160]
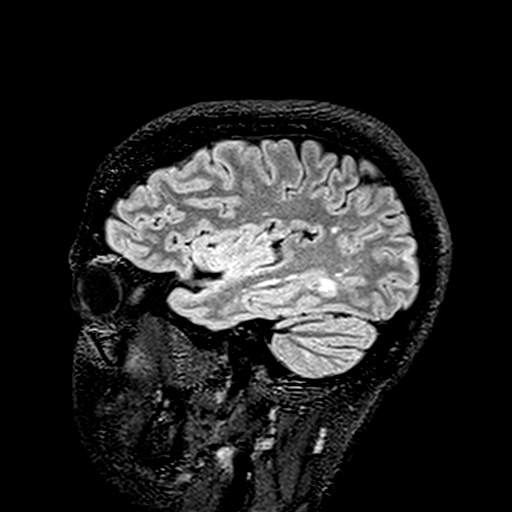
[im 69/160]
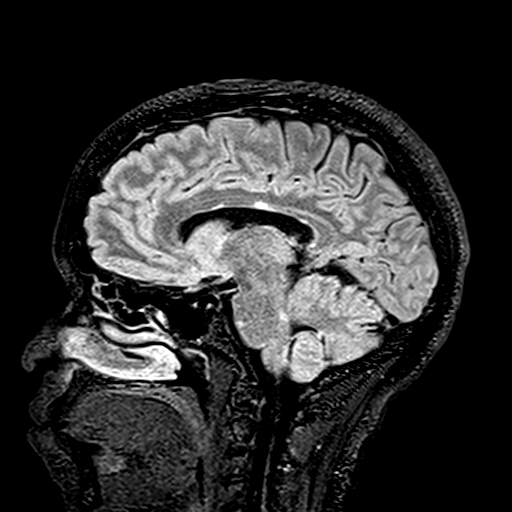
[im 91/160]
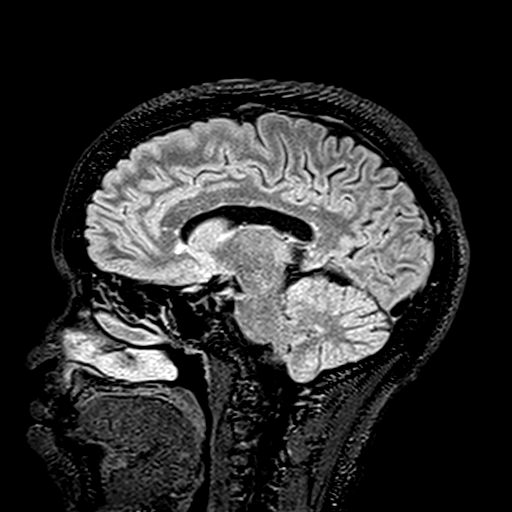
[im 114/160]
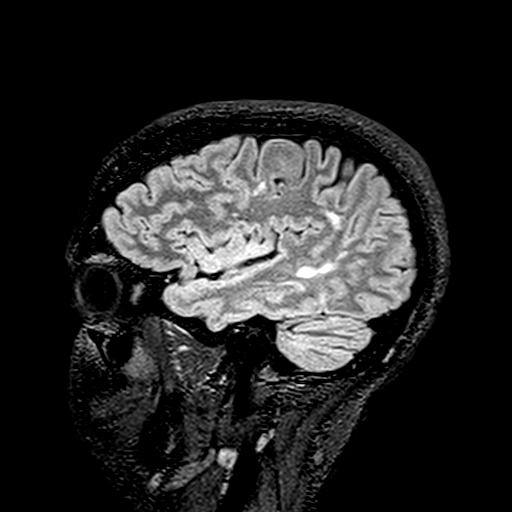
[im 137/160]
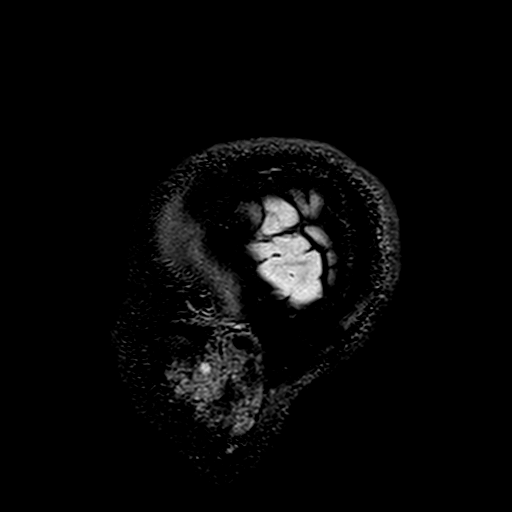
[im 160/160]
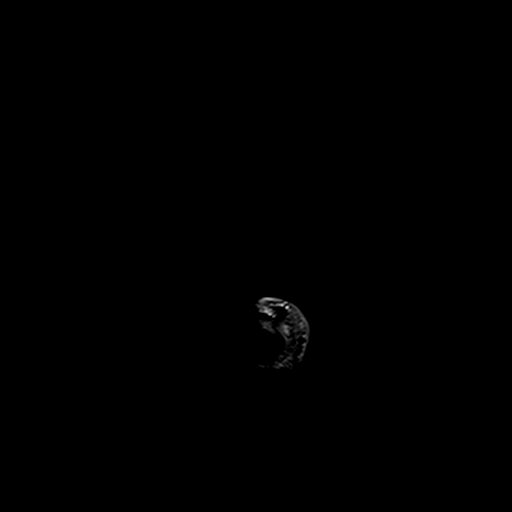

[Series 10: T1 · sagittal · 5.0mm · 0.75mm/px · 1 of 23 slices shown]
[im 1/23]
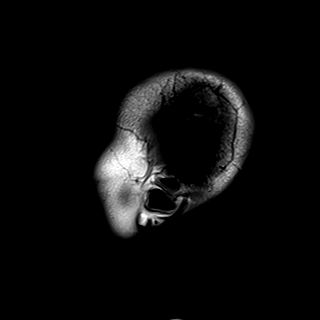

[Series 11: T2 · axial · 5.0mm · 0.72mm/px · 1 of 25 slices shown]
[im 1/25]
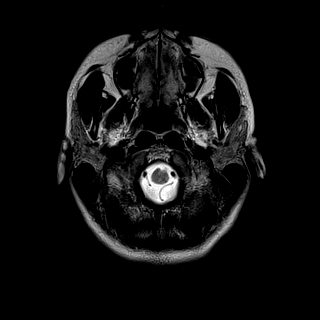

[Series 12: FLAIR · axial · 5.0mm · 0.45mm/px · 1 of 25 slices shown]
[im 1/25]
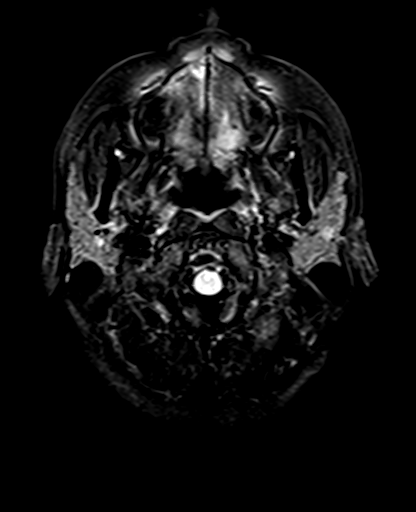

[Series 13: mag_images · axial · 3.0mm · 0.90mm/px · z∈[-155,-8]mm · 3 of 60 slices shown]
[im 1/60]
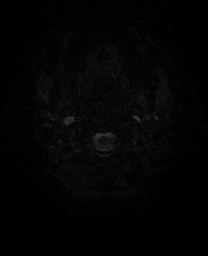
[im 30/60]
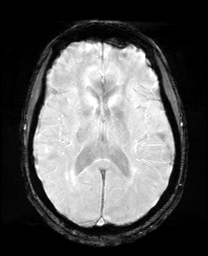
[im 60/60]
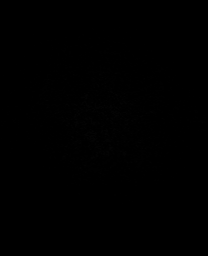

[Series 14: pha_images · axial · 3.0mm · 0.90mm/px · z∈[-155,-20]mm · 3 of 55 slices shown]
[im 1/55]
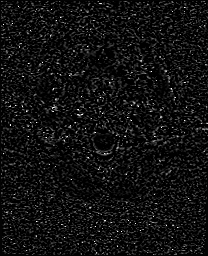
[im 28/55]
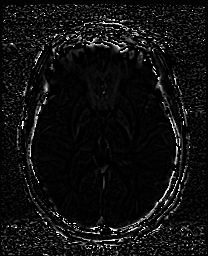
[im 55/55]
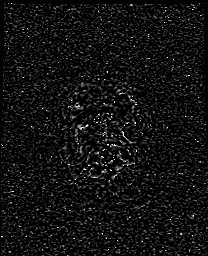

[Series 15: swi_images · axial · 3.0mm · 0.90mm/px · 1 of 60 slices shown]
[im 1/60]
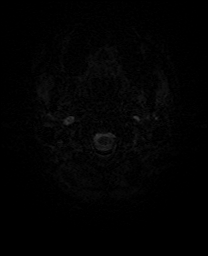

[Series 18: T2 post-contrast · coronal · 5.0mm · 0.72mm/px · 2 of 30 slices shown]
[im 1/30]
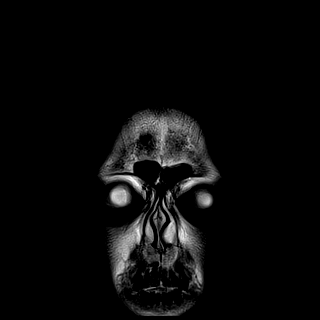
[im 30/30]
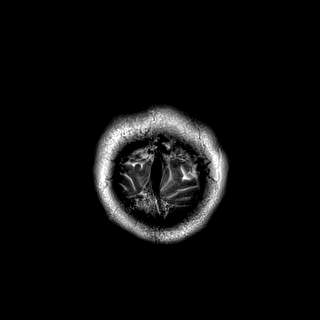

[Series 20: T1 post-contrast · coronal · 5.0mm · 0.34mm/px · 2 of 30 slices shown (1 of 2)]
[im 1/30]
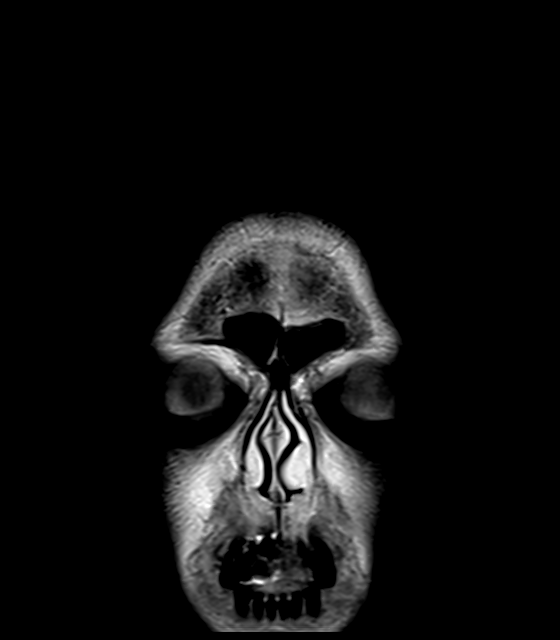
[im 30/30]
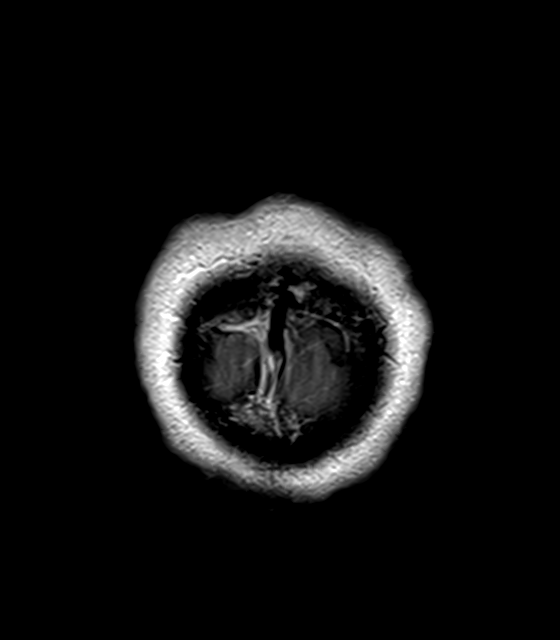

[Series 21: T1 post-contrast · sagittal · 5.0mm · 0.72mm/px · 1 of 23 slices shown (2 of 2)]
[im 1/23]
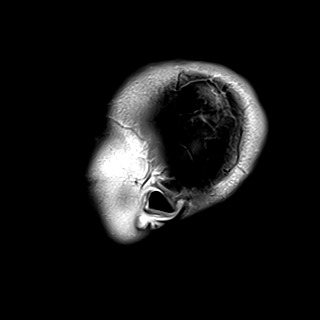

[37 of 48 positions shown; findings below may reference images not displayed]

FINDINGS: Brain: No acute infarct, mass effect or extra-axial collection. No
acute or chronic hemorrhage. Unchanged distribution white matter
lesions in a pattern consistent with chronic demyelination. There
are no contrast-enhancing lesions to indicate active demyelination.
There is no age advanced volume loss. Unchanged left middle
cerebellar peduncle lesion. The midline structures are normal.

Vascular: Major flow voids are preserved.

Skull and upper cervical spine: Normal calvarium and skull base.
Visualized upper cervical spine and soft tissues are normal.

Sinuses/Orbits:No paranasal sinus fluid levels or advanced mucosal
thickening. No mastoid or middle ear effusion. Normal orbits.
IMPRESSION: Unchanged distribution of white matter lesions in a pattern
consistent with chronic demyelination. No contrast-enhancing lesions
to indicate active demyelination.

## 2021-03-10 IMAGING — MR MR CERVICAL SPINE WO/W CM
6 of 8 series · 30 of 48 positions shown · IV contrast (gadavist)
Comparison: MRI of the cervical spine from [DATE]

CLINICAL DATA: Spinal stenosis, C-spine CSF leak suspected/
Spontaneous intracranial hypotension; Myelopathy, acute or
progressive CSF leak/Spontaneous intracranial hypotension suspected

Additional history: Per chart review patient has a history of
multiple sclerosis with new left leg numbness and weakness after
recent zoster vaccine.
EXAM:
MRI CERVICAL AND THORACIC SPINE WITHOUT AND WITH CONTRAST
TECHNIQUE: Multiplanar and multiecho pulse sequences of the cervical spine, to
include the craniocervical junction and cervicothoracic junction,
and the thoracic spine, were obtained without and with intravenous
contrast.
CONTRAST:  6mL GADAVIST GADOBUTROL 1 MMOL/ML IV SOLN

[Series 1: T2 · sagittal · 3.0mm · 0.69mm/px · 4 of 15 slices shown (1 of 2)]
[im 1/15]
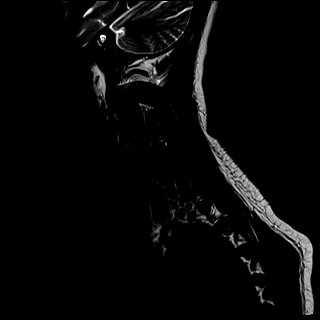
[im 5/15]
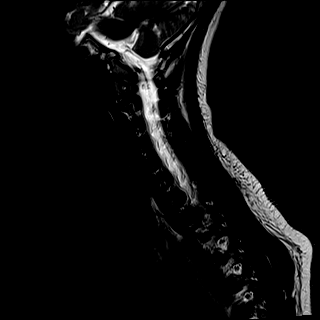
[im 10/15]
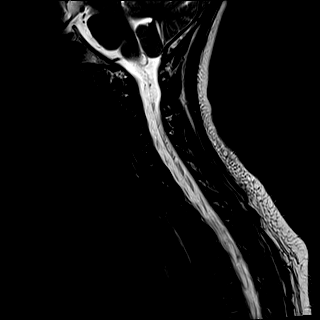
[im 15/15]
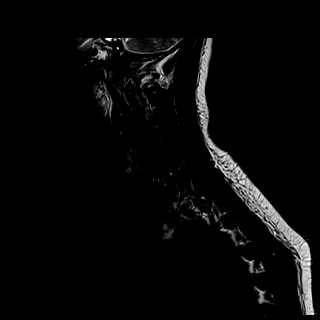

[Series 2: T1 · sagittal · 3.0mm · 0.69mm/px · 4 of 15 slices shown (1 of 2)]
[im 1/15]
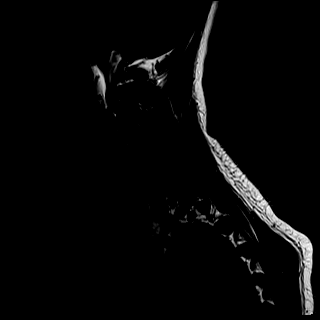
[im 5/15]
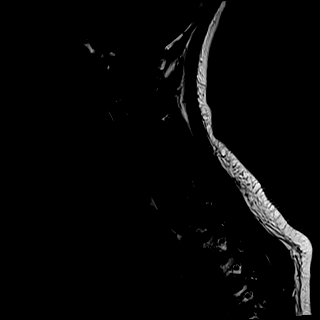
[im 10/15]
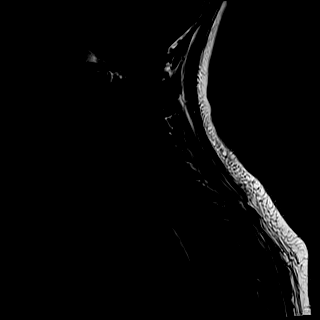
[im 15/15]
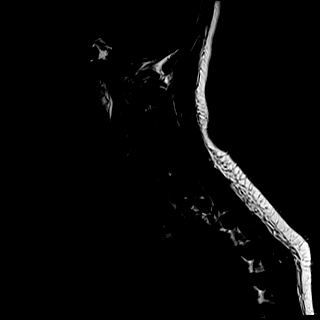

[Series 3: STIR · sagittal · 3.0mm · 0.86mm/px · 4 of 15 slices shown]
[im 1/15]
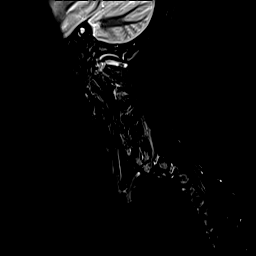
[im 5/15]
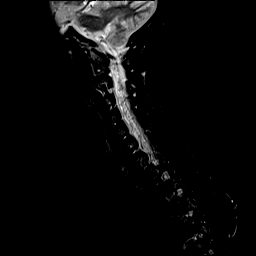
[im 10/15]
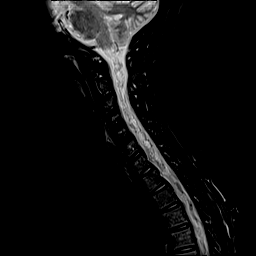
[im 15/15]
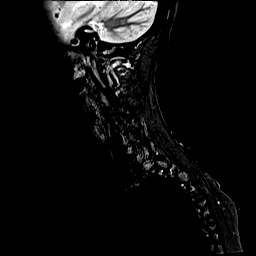

[Series 4: T2 · axial · 3.0mm · 0.66mm/px · z∈[-133,-30]mm · 8 of 35 slices shown (2 of 2)]
[im 1/35]
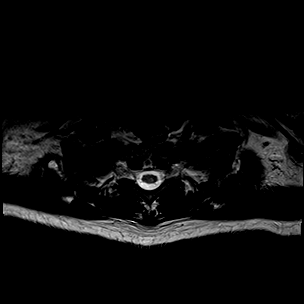
[im 5/35]
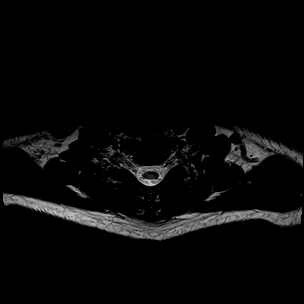
[im 10/35]
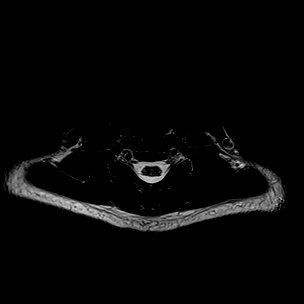
[im 15/35]
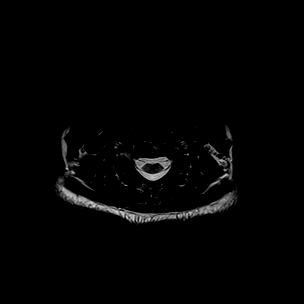
[im 20/35]
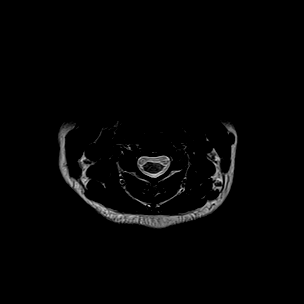
[im 25/35]
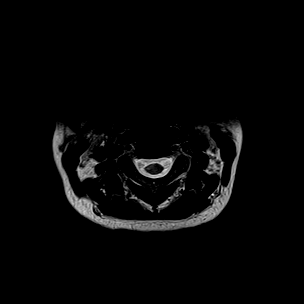
[im 30/35]
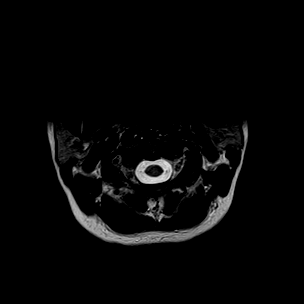
[im 35/35]
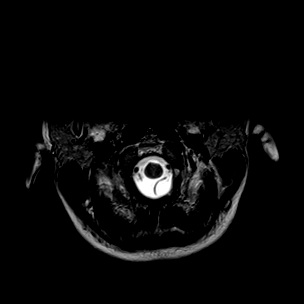

[Series 6: T1 · axial · 3.0mm · 0.35mm/px · z∈[-129,-26]mm · 8 of 35 slices shown (2 of 2)]
[im 1/35]
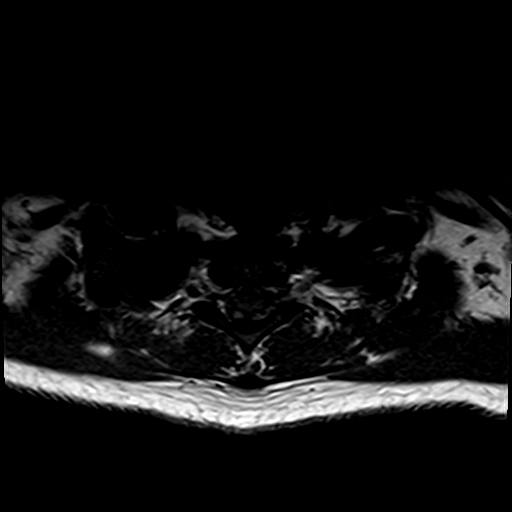
[im 5/35]
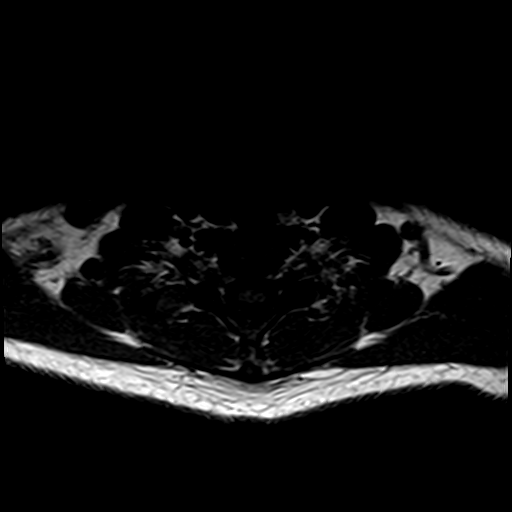
[im 10/35]
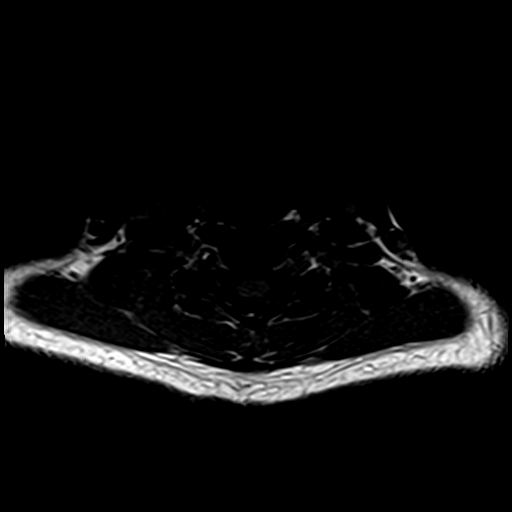
[im 15/35]
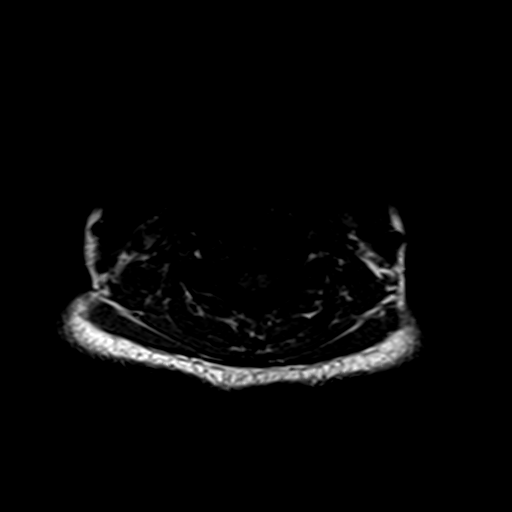
[im 20/35]
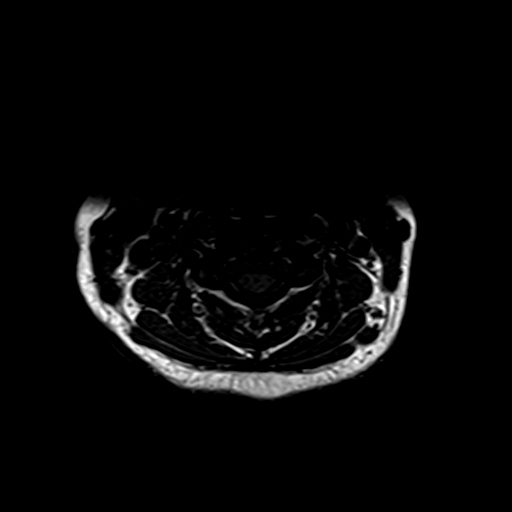
[im 25/35]
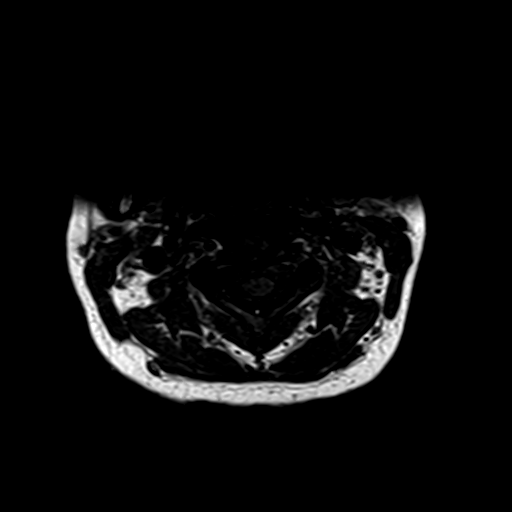
[im 30/35]
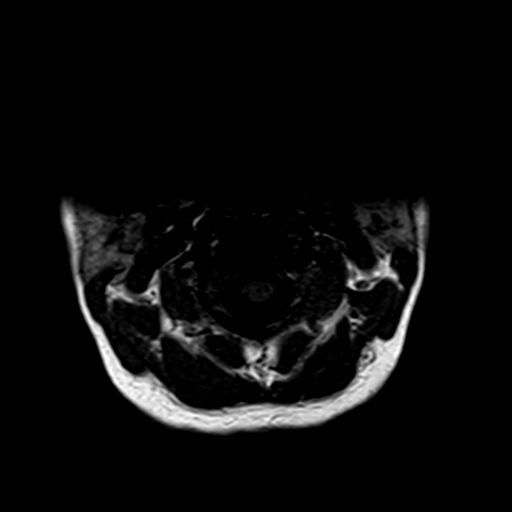
[im 35/35]
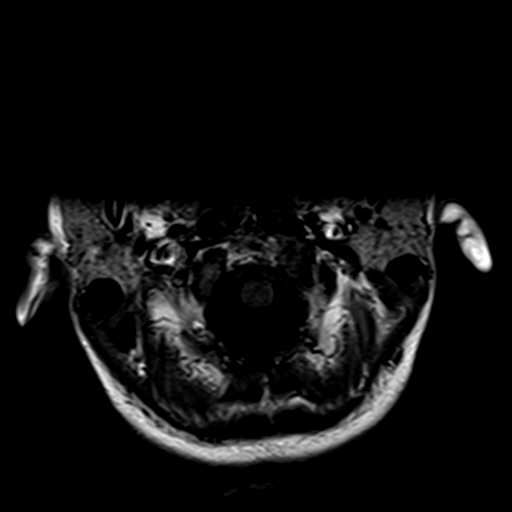

[Series 7: T1 post-contrast · sagittal · 3.0mm · 0.43mm/px · 2 of 15 slices shown]
[im 1/15]
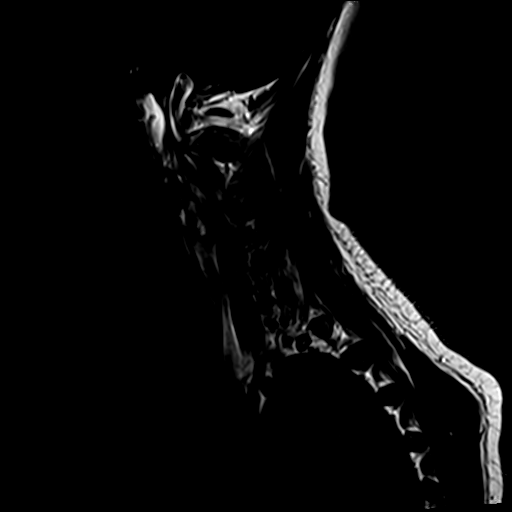
[im 5/15]
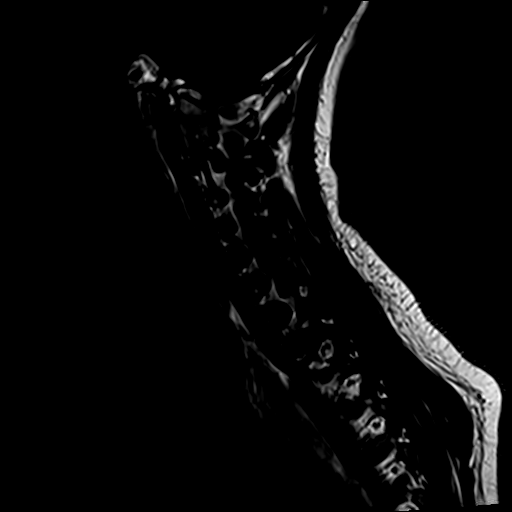

[30 of 48 positions shown; findings below may reference images not displayed]

FINDINGS: MRI CERVICAL SPINE FINDINGS

Alignment: No substantial sagittal subluxation.

Vertebrae: No evidence of acute fracture, discitis/osteomyelitis, or
suspicious bone lesion.

Cord: Numerous short-segment T2 hyperintense lesions throughout the
cervical cord, including lesions at C2-C3, C3-C4, C5-C6, and C7-T1.
No enhancing lesions. Mild cord atrophy at the cervicothoracic
junction, likely progressed.

Posterior Fossa, vertebral arteries, paraspinal tissues: Visualized
vertebral artery flow voids are maintained. See same day MRI head
for intracranial valuation.

Disc levels: No significant disc protrusion, foraminal stenosis, or
canal stenosis.

MRI THORACIC SPINE FINDINGS

Alignment: Mildly exaggerated thoracic kyphosis. No substantial
sagittal subluxation.

Vertebrae: Benign vertebral venous malformation at T12. No
suspicious bone lesions. No specific evidence of acute fracture or
discitis/osteomyelitis.

Cord: Numerous short-segment T2 hyperintensities in the cord,
including lesions at T2-T3, T3-T4, T4-T5, T6-T7, T10, T10-T11, and
T11-T12. Axial postcontrast imaging is somewhat limited by motion
without definite/convincing abnormal cord enhancement when
correlating with the good quality postcontrast sagittal T1.

Paraspinal and other soft tissues: Unremarkable. No paraspinal fluid
collections.

Disc levels: No significant disc protrusion, foraminal stenosis, or
canal stenosis.
IMPRESSION: MRI cervical spine:

1. Numerous short-segment T2 hyperintense lesions throughout the
cervical cord, detailed above and compatible with prior
demyelination. Many of these lesions are more conspicuous than on
the [JL] prior suggesting possible progression; however, today's
study is of significantly better quality and therefore direct
comparison is limited. No enhancing lesions to suggest active
demyelination.
2. Mild cord atrophy at the cervicothoracic junction, likely
progressed.

MRI thoracic spine:

Numerous short-segment T2 hyperintense cord lesions throughout the
thoracic cord, detailed above and compatible with prior
demyelination. No prior study to assess for change. No convincing
cord enhancement to suggest active demyelination.

## 2021-03-10 IMAGING — DX DG CHEST 1V PORT
1 series · 1 of 1 positions shown · non-contrast
Comparison: [DATE]

CLINICAL DATA: [7K].

EXAM:
PORTABLE CHEST 1 VIEW

[chest]
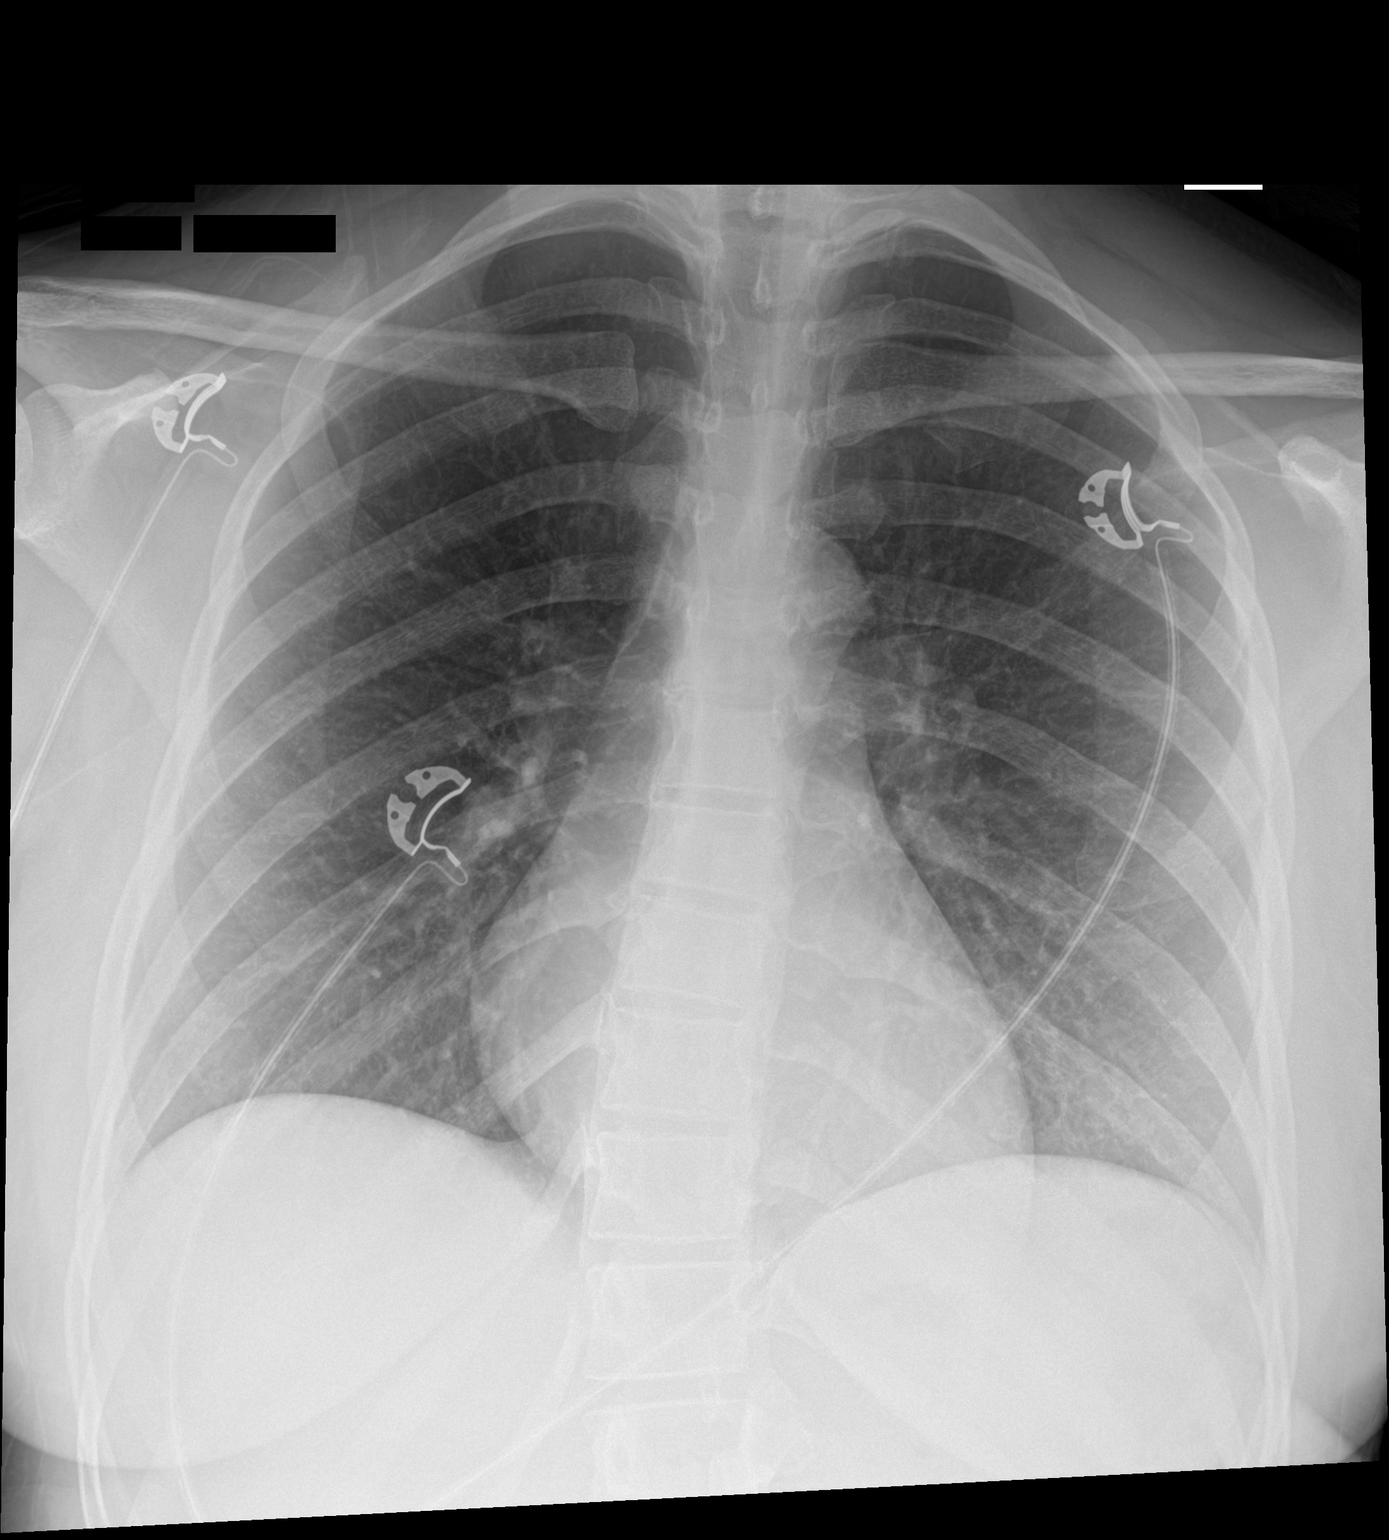

[1 of 1 positions shown; findings below may reference images not displayed]

FINDINGS: Normal heart size and mediastinal contours. No acute infiltrate or
edema. No effusion or pneumothorax. No acute osseous findings. Mild
thoracic dextrocurvature.
IMPRESSION: Negative for pneumonia.

## 2021-03-10 MED ORDER — ESCITALOPRAM OXALATE 10 MG PO TABS: 10.0000 mg | ORAL_TABLET | Freq: Every day | ORAL | Status: DC

## 2021-03-10 MED ORDER — VERAPAMIL HCL ER 120 MG PO TBCR
120.0000 mg | EXTENDED_RELEASE_TABLET | Freq: Every day | ORAL | Status: DC
  Administered 2021-03-11: 120 mg via ORAL
  Filled 2021-03-10: qty 1

## 2021-03-10 MED ORDER — ACETAMINOPHEN 650 MG RE SUPP: 650.0000 mg | Freq: Four times a day (QID) | RECTAL | Status: DC | PRN

## 2021-03-10 MED ORDER — SODIUM CHLORIDE 0.9 % IV SOLN
250.0000 mg | Freq: Every day | INTRAVENOUS | Status: DC
  Administered 2021-03-10 – 2021-03-11 (×2): 250 mg via INTRAVENOUS
  Filled 2021-03-10 (×2): qty 2

## 2021-03-10 MED ORDER — HEPARIN SODIUM (PORCINE) 5000 UNIT/ML IJ SOLN
5000.0000 [IU] | Freq: Three times a day (TID) | INTRAMUSCULAR | Status: DC
  Administered 2021-03-10 – 2021-03-11 (×5): 5000 [IU] via SUBCUTANEOUS
  Filled 2021-03-10 (×5): qty 1

## 2021-03-10 MED ORDER — ESCITALOPRAM OXALATE 10 MG PO TABS
10.0000 mg | ORAL_TABLET | Freq: Every day | ORAL | Status: DC
  Filled 2021-03-10: qty 1

## 2021-03-10 MED ORDER — GADOBUTROL 1 MMOL/ML IV SOLN
6.0000 mL | Freq: Once | INTRAVENOUS | Status: AC | PRN
  Administered 2021-03-10: 6 mL via INTRAVENOUS

## 2021-03-10 MED ORDER — ONDANSETRON HCL 4 MG/2ML IJ SOLN
4.0000 mg | Freq: Four times a day (QID) | INTRAMUSCULAR | Status: DC | PRN
  Administered 2021-03-10: 4 mg via INTRAVENOUS
  Filled 2021-03-10: qty 2

## 2021-03-10 MED ORDER — SODIUM CHLORIDE 0.9% FLUSH
3.0000 mL | Freq: Two times a day (BID) | INTRAVENOUS | Status: DC
  Administered 2021-03-10 – 2021-03-11 (×3): 3 mL via INTRAVENOUS

## 2021-03-10 MED ORDER — VERAPAMIL HCL ER 120 MG PO TBCR: 120.0000 mg | EXTENDED_RELEASE_TABLET | Freq: Every day | ORAL | Status: DC

## 2021-03-10 MED ORDER — NIRMATRELVIR/RITONAVIR (PAXLOVID)TABLET: 3.0000 | ORAL_TABLET | Freq: Two times a day (BID) | ORAL | Status: DC

## 2021-03-10 MED ORDER — BACLOFEN 10 MG PO TABS
10.0000 mg | ORAL_TABLET | Freq: Three times a day (TID) | ORAL | Status: DC
  Filled 2021-03-10 (×3): qty 1

## 2021-03-10 MED ORDER — BACLOFEN 10 MG PO TABS
10.0000 mg | ORAL_TABLET | Freq: Three times a day (TID) | ORAL | Status: DC
  Administered 2021-03-10 – 2021-03-11 (×3): 10 mg via ORAL
  Filled 2021-03-10 (×3): qty 1

## 2021-03-10 MED ORDER — HYDROCODONE-ACETAMINOPHEN 5-325 MG PO TABS: 1.0000 | ORAL_TABLET | ORAL | Status: DC | PRN

## 2021-03-10 MED ORDER — SODIUM CHLORIDE 0.9% FLUSH: 3.0000 mL | INTRAVENOUS | Status: DC | PRN

## 2021-03-10 MED ORDER — TIZANIDINE HCL 2 MG PO TABS
2.0000 mg | ORAL_TABLET | Freq: Every evening | ORAL | Status: DC | PRN
  Filled 2021-03-10: qty 1

## 2021-03-10 MED ORDER — ACETAMINOPHEN 325 MG PO TABS: 650.0000 mg | ORAL_TABLET | Freq: Four times a day (QID) | ORAL | Status: DC | PRN

## 2021-03-10 MED ORDER — POTASSIUM CHLORIDE CRYS ER 20 MEQ PO TBCR
40.0000 meq | EXTENDED_RELEASE_TABLET | Freq: Once | ORAL | Status: AC
  Administered 2021-03-10: 40 meq via ORAL
  Filled 2021-03-10: qty 2

## 2021-03-10 MED ORDER — SODIUM CHLORIDE 0.9 % IV SOLN: 250.0000 mL | INTRAVENOUS | Status: DC | PRN

## 2021-03-10 NOTE — ED Notes (Signed)
Pt sitting up eating dinner tray.

## 2021-03-10 NOTE — Progress Notes (Signed)
PROGRESS NOTE    Toni Weaver  GGY:[id number] DOB: July 15, 1991 DOA: 03/09/2021 PCP: Harvie Heck, MD   Brief Narrative: Toni Weaver is a 30 y.o. female with a history of multiple sclerosis.  Patient presented secondary to worsening bilateral lower extremity weakness and urinary incontinence, concerning for multiple sclerosis exacerbation.  In setting of recent shingles vaccine.  Patient given a dose of 1 g Solu-Medrol on admission and neurology was consulted who recommended continued Solu-Medrol to 50 mg daily.  Patient found to be incidentally COVID-positive.   Assessment & Plan:   Active Problems:   Multiple sclerosis exacerbation (HCC)   Hypokalemia   COVID-19 virus infection  Multiple sclerosis exacerbation Patient given a dose of Solu-medrol 1g x1 on admission. Neurology consulted and are recommending reduced dose in setting of COVID-19 infection of Solu-medrol 250 mg daily; they have also recommended MRI C-T spine -Follow-up MRI -Continue Solu-medrol 250 mg daily  Hypokalemia Mild. Potassium supplementation given. Resolved.  COVID-19 virus infection Incidental and possibly related to previous infection three months prior in setting of immune compromised state. Completely asymptomatic. Chest x-ray with no pulmonary infiltrates. -Discontinue Paxlovid   DVT prophylaxis: Lovenox Code Status:   Code Status: Full Code Family Communication: Mother at bedside Disposition Plan: Discharge home pending neurology recommendations   Consultants:  Neurology  Procedures:  None  Antimicrobials: None    Subjective: Was able to walk but still with weakness from baseline.  Objective: Vitals:   03/10/21 1130 03/10/21 1200 03/10/21 1230 03/10/21 1300  BP: 115/76 113/73 125/81 107/68  Pulse: 65 61 72 (!) 46  Resp: 19 16 15 17   Temp:      TempSrc:      SpO2: 96% 98% 99% 100%  Weight:      Height:        Intake/Output Summary (Last 24 hours) at 03/10/2021  1639 Last data filed at 03/10/2021 1240 Gross per 24 hour  Intake 50 ml  Output --  Net 50 ml   Filed Weights   03/09/21 1531  Weight: 61.2 kg    Examination:  General exam: Appears calm and comfortable Respiratory system: Clear to auscultation. Respiratory effort normal. Cardiovascular system: S1 & S2 heard, RRR. No murmurs, rubs, gallops or clicks. Gastrointestinal system: Abdomen is nondistended, soft and nontender. No organomegaly or masses felt. Normal bowel sounds heard. Central nervous system: Alert and oriented. Left leg 3/5 compared to 4/5 on right. Musculoskeletal: No edema. No calf tenderness Skin: No cyanosis. No rashes Psychiatry: Judgement and insight appear normal. Mood & affect appropriate.     Data Reviewed: I have personally reviewed following labs and imaging studies  CBC Lab Results  Component Value Date   WBC 5.7 03/10/2021   RBC 4.29 03/10/2021   HGB 12.3 03/10/2021   HCT 33.6 (L) 03/10/2021   MCV 78.3 (L) 03/10/2021   MCH 28.7 03/10/2021   PLT 279 03/10/2021   MCHC 36.6 (H) 03/10/2021   RDW 13.3 03/10/2021   LYMPHSABS 0.3 (L) 03/10/2021   MONOABS 0.2 03/10/2021   EOSABS 0.3 03/10/2021   BASOSABS 0.0 03/10/2021     Last metabolic panel Lab Results  Component Value Date   NA 136 03/10/2021   K 4.2 03/10/2021   CL 105 03/10/2021   CO2 25 03/10/2021   BUN 12 03/10/2021   CREATININE 0.67 03/10/2021   GLUCOSE 154 (H) 03/10/2021   GFRNONAA >60 03/10/2021   GFRAA 133 10/25/2019   CALCIUM 9.0 03/10/2021   PHOS 2.5 03/10/2021  PROT 6.5 03/10/2021   ALBUMIN 3.6 03/10/2021   LABGLOB 2.2 10/25/2019   AGRATIO 2.0 10/25/2019   BILITOT 0.7 03/10/2021   ALKPHOS 36 (L) 03/10/2021   AST 18 03/10/2021   ALT 14 03/10/2021   ANIONGAP 6 03/10/2021    CBG (last 3)  No results for input(s): GLUCAP in the last 72 hours.   GFR: Estimated Creatinine Clearance: 85.1 mL/min (by C-G formula based on SCr of 0.67 mg/dL).  Coagulation Profile: No  results for input(s): INR, PROTIME in the last 168 hours.  Recent Results (from the past 240 hour(s))  Resp Panel by RT-PCR (Flu A&B, Covid) Nasopharyngeal Swab     Status: Abnormal   Collection Time: 03/09/21  9:40 PM   Specimen: Nasopharyngeal Swab; Nasopharyngeal(NP) swabs in vial transport medium  Result Value Ref Range Status   SARS Coronavirus 2 by RT PCR POSITIVE (A) NEGATIVE Final    Comment: RESULT CALLED TO, READ BACK BY AND VERIFIED WITH: R SANGLANG,RN@2319  03/09/21 MKELLY (NOTE) SARS-CoV-2 target nucleic acids are DETECTED.  The SARS-CoV-2 RNA is generally detectable in upper respiratory specimens during the acute phase of infection. Positive results are indicative of the presence of the identified virus, but do not rule out bacterial infection or co-infection with other pathogens not detected by the test. Clinical correlation with patient history and other diagnostic information is necessary to determine patient infection status. The expected result is Negative.  Fact Sheet for Patients: BloggerCourse.com  Fact Sheet for Healthcare Providers: SeriousBroker.it  This test is not yet approved or cleared by the [place] FDA and  has been authorized for detection and/or diagnosis of SARS-CoV-2 by FDA under an Emergency Use Authorization (EUA).  This EUA will remain in effect (meaning this test can b e used) for the duration of  the COVID-19 declaration under Section 564(b)(1) of the Act, 21 U.S.C. section 360bbb-3(b)(1), unless the authorization is terminated or revoked sooner.     Influenza A by PCR NEGATIVE NEGATIVE Final   Influenza B by PCR NEGATIVE NEGATIVE Final    Comment: (NOTE) The Xpert Xpress SARS-CoV-2/FLU/RSV plus assay is intended as an aid in the diagnosis of influenza from Nasopharyngeal swab specimens and should not be used as a sole basis for treatment. Nasal washings and aspirates are  unacceptable for Xpert Xpress SARS-CoV-2/FLU/RSV testing.  Fact Sheet for Patients: BloggerCourse.com  Fact Sheet for Healthcare Providers: SeriousBroker.it  This test is not yet approved or cleared by the [place] FDA and has been authorized for detection and/or diagnosis of SARS-CoV-2 by FDA under an Emergency Use Authorization (EUA). This EUA will remain in effect (meaning this test can be used) for the duration of the COVID-19 declaration under Section 564(b)(1) of the Act, 21 U.S.C. section 360bbb-3(b)(1), unless the authorization is terminated or revoked.  Performed at [facility], [address] [place], [place] [address]         Radiology Studies: MR Brain W and Wo Contrast  Result Date: 03/10/2021 CLINICAL DATA:  Multiple sclerosis EXAM: MRI HEAD WITHOUT AND WITH CONTRAST TECHNIQUE: Multiplanar, multiecho pulse sequences of the brain and surrounding structures were obtained without and with intravenous contrast. CONTRAST:  65mL GADAVIST GADOBUTROL 1 MMOL/ML IV SOLN COMPARISON:  09/17/2020 FINDINGS: Brain: No acute infarct, mass effect or extra-axial collection. No acute or chronic hemorrhage. Unchanged distribution white matter lesions in a pattern consistent with chronic demyelination. There are no contrast-enhancing lesions to indicate active demyelination. There is no age advanced volume loss. Unchanged left  middle cerebellar peduncle lesion. The midline structures are normal. Vascular: Major flow voids are preserved. Skull and upper cervical spine: Normal calvarium and skull base. Visualized upper cervical spine and soft tissues are normal. Sinuses/Orbits:No paranasal sinus fluid levels or advanced mucosal thickening. No mastoid or middle ear effusion. Normal orbits. IMPRESSION: Unchanged distribution of white matter lesions in a pattern consistent with chronic demyelination. No contrast-enhancing lesions to indicate  active demyelination. Electronically Signed   By: Deatra Robinson M.D.   On: 03/10/2021 01:33   DG CHEST PORT 1 VIEW  Result Date: 03/10/2021 CLINICAL DATA:  COVID-19. EXAM: PORTABLE CHEST 1 VIEW COMPARISON:  10/02/2016 FINDINGS: Normal heart size and mediastinal contours. No acute infiltrate or edema. No effusion or pneumothorax. No acute osseous findings. Mild thoracic dextrocurvature. IMPRESSION: Negative for pneumonia. Electronically Signed   By: Marnee Spring M.D.   On: 03/10/2021 04:19        Scheduled Meds:  baclofen  10 mg Oral TID   [START ON 03/11/2021] escitalopram  10 mg Oral QHS   heparin  5,000 Units Subcutaneous Q8H   sodium chloride flush  3 mL Intravenous Q12H   [START ON 03/11/2021] verapamil  120 mg Oral Daily   Continuous Infusions:  sodium chloride     methylPREDNISolone (SOLU-MEDROL) injection Stopped (03/10/21 1240)     LOS: 0 days     Jacquelin Hawking, MD [facility] 03/10/2021, 4:39 PM  If 7PM-7AM, please contact night-coverage [email or web address]

## 2021-03-10 NOTE — ED Notes (Signed)
Patient transported to MRI 

## 2021-03-10 NOTE — Progress Notes (Signed)
Pt has arrive to 2 west 06. Alert and oriented x 4, identified appropriately. No signs of acute distress, VS stable. Cardiac monitor placed and CCMD notified. Pt oriented to room and equipment, instructed to use call bell for assistance. Call bell and phone left within pt reach. Will continue to monitor and treat pt per MD orders.

## 2021-03-10 NOTE — Consult Note (Signed)
Neurology Consultation Reason for Consult: Left leg weakness Referring Physician: Adela Glimpse, A  CC: Left leg weakness  History is obtained from: Patient  HPI: Toni Weaver is a 30 y.o. female with a history of MS managed on Tysabri but changed to kesimpta due to JC virus indicators.  She was in her normal state of health until Thursday when she got a zoster vaccine.  On Friday she started noticing left-leg numbness and weakness and began having little more difficulty with her gait.  This progressed and so after calling her neurologist she sought care in the emergency department today.  She also has been having some problems with urinary incontinence as well.    ROS: A 14 point ROS was performed and is negative except as noted in the HPI.  Past Medical History:  Diagnosis Date   Abnormality of gait 11/05/2015   Multiple sclerosis (HCC) 11/24/2012   Optic neuritis 11/24/2012     Family History  Problem Relation Age of Onset   Hypertension Mother    Hypertension Father    Cancer Maternal Grandmother        Breast cancer     Social History:  reports that she has never smoked. She has never used smokeless tobacco. She reports that she does not drink alcohol and does not use drugs.   Exam: Current vital signs: BP 104/73   Pulse 71   Temp 98.3 F (36.8 C) (Oral)   Resp 18   Ht 5\' 3"  (1.6 m)   Wt 61.2 kg   LMP 02/23/2021 (Approximate)   SpO2 100%   BMI 23.91 kg/m  Vital signs in last 24 hours: Temp:  [98.3 F (36.8 C)-98.6 F (37 C)] 98.3 F (36.8 C) (08/06 2230) Pulse Rate:  [64-77] 71 (08/07 0130) Resp:  [12-21] 18 (08/07 0130) BP: (98-109)/(59-76) 104/73 (08/07 0130) SpO2:  [97 %-100 %] 100 % (08/07 0130) Weight:  [61.2 kg] 61.2 kg (08/06 1531)   Physical Exam  Constitutional: Appears well-developed and well-nourished.  Psych: Affect appropriate to situation Eyes: No scleral injection HENT: No OP obstruction MSK: no joint deformities.  Cardiovascular: Normal  rate and regular rhythm.  Respiratory: Effort normal, non-labored breathing GI: Soft.  No distension. There is no tenderness.  Skin: WDI  Neuro: Mental Status: Patient is awake, alert, oriented to person, place, month, year, and situation. Patient is able to give a clear and coherent history. No signs of aphasia or neglect Cranial Nerves: II: Visual Fields are full. Pupils are equal, round, and reactive to light.   III,IV, VI: EOMI without ptosis or diploplia.  V: Facial sensation is symmetric to temperature VII: Facial movement is symmetric.  VIII: hearing is intact to voice X: Uvula elevates symmetrically XI: Shoulder shrug is symmetric. XII: tongue is midline without atrophy or fasciculations.  Motor: Tone is normal. Bulk is normal. 5/5 strength was present in bilateral upper extrmeities, she has 4/5 weakness of the left lower extremity Sensory: Sensation is diminished in the left lower extremity Deep Tendon Reflexes: 2+ and symmetric in the biceps and patellae.  Plantars: Toes are downgoing bilaterally.  Cerebellar: FNF with what appears to be a very mild sensory ataxia bilaterally      I have reviewed labs in epic and the results pertinent to this consultation are: BMP-unremarkable COVID-positive  I have reviewed the images obtained: MRI brain-no demyelinating lesions  Impression:  30 year old female with MS who presents with worsening left lower extremity weakness and urinary incontinence in the setting of  recent vaccine administration and COVID infection.  Her COVID appears to be asymptomatic, but it does complicate her treatment paradigm somewhat in the short-term.  High doses of steroids have been associated with worsening courses and given that she is already immune suppressed does raise the question of how aggressively to treat with steroids.  She is already received 1 g of IV Solu-Medrol, but I think I would reduce the dose for subsequent days.  If the MRI T and  C-spine do not show any enhancement, could consider an abbreviated course.  Recommendations: 1) MRI T and C-spine 2) Solu-Medrol 250 mg daily 3) PT, OT 4) COVID treatment per internal medicine  Ritta Slot, MD [facility] [phone]  If 7pm- 7am, please page neurology on call as listed in AMION

## 2021-03-10 NOTE — Progress Notes (Signed)
PT Cancellation & Discharge Note  Patient Details Name: Toni Weaver MRN: [id number] DOB: 10/07/1990   Cancelled Treatment:    Reason Eval/Treat Not Completed: Other (comment). Pt and mother reporting pt is currently working with Outpatient neuro PT and requested no acute PT services as they will plan to follow-up with her current therapist. Educated pt to use her RW and have assistance for gait and stairs until cleared by her Outpatient PT as pt is reporting having bil foot drop. Pt and mother verbalized understanding. Recommend pt follows up with her Outpatient neuro PT. Acute PT services will sign off per pt request at this time.   Raymond Gurney, PT, DPT Acute Rehabilitation Services  Pager: [phone] Office: [phone]    Jewel Baize 03/10/2021, 10:11 AM

## 2021-03-10 NOTE — Progress Notes (Signed)
OT Cancellation Note and Discharge  Patient Details Name: Toni Weaver MRN: [id number] DOB: [birth year]   Cancelled Treatment:     In to see patient and she was not concerned with basic ADLs and moving around--has mother to A prn and she states she feels confident in A'ing her dtr. No OT needs identified, we will sign off.  Ignacia Palma, OTR/L Acute Rehab Services Pager [phone] Office [phone]    Toni Weaver 03/10/2021, 10:42 AM

## 2021-03-10 NOTE — ED Notes (Signed)
Attempted to call report. Secretary states the RN will call me back shortly.

## 2021-03-10 NOTE — ED Notes (Signed)
Attempted report X1. Has not been approved by floors

## 2021-03-10 NOTE — ED Notes (Signed)
Report called to RN receiving pt in room 2W06.

## 2021-03-10 NOTE — Progress Notes (Signed)
Brief Neuro Update:  Reviewed MRI C and T spine w + w/o c with no contrast-enhancing lesion concerning for active demyelination.  I think this is likely a pseudo flareup of multiple sclerosis in the setting of COVID vs shingles vaccination.  Given some evidence that high-dose oral steroids have been associated worsening courses with COVID, I think reduced dose solumedrol 250mg  IV daily x 3 doses is a good compromise. If she is still symptomatic despite 3 doses of reduced dose solumderol, can offer an additional 2 doses for a total of 5 doses.  [facility] Pager Number Erick Blinks

## 2021-03-10 NOTE — ED Notes (Signed)
Patient currently at MRI

## 2021-03-11 NOTE — Discharge Instructions (Signed)
Toni Weaver,  You were in the hospital with what the neurologist is calling a likely pseudo flair of your multiple sclerosis. You symptoms have improved with steroids, which you received IV. Please follow-up with your PCP/neurologist.

## 2021-03-11 NOTE — TOC Progression Note (Signed)
Transition of Care [facility]) - Progression Note    Patient Details  Name: Toni Weaver MRN: [id number] Date of Birth: 01-22-1991  [facility] CM/SW Contact  Beckie Busing, RN Phone Number:[phone]  03/11/2021, 3:34 PM  Clinical Narrative:    [facility] consulted for outpatient neuro rehab. Patient states that she is currently active with [facility]. CM has faxed new order to [facility] rehab. Patient to resume services upon discharge.       Barriers to Discharge: Continued Medical Work up  Expected Discharge Plan and Services                                                 Social Determinants of Health (SDOH) Interventions    Readmission Risk Interventions No flowsheet data found.

## 2021-03-11 NOTE — Telephone Encounter (Signed)
   To Dr Epimenio Foot:  The patient contacted the office on Saturday stating that she had received the first of 2 doses of Shingrix vaccine and soon after developed bladder and bowel incontinence, lower extremity weakness.  I urged the patient to present to the emergency room and by 2 PM on Saturday she had presented to [facility] ED.  I had spoken to Dr. Nursing at about 1 PM announcing that this patient would come to the emergency room and gave Dr. Elesa Massed her date of birth, medication history and medical record number in epic.  Yet the patient waited 7 hours in the emergency room and was finally seen by a physician, another emergency room physician, at about 9:30 PM that colic then ordered an MRI to rule out spinal cord demyelinating lesion, and started IV Solu-Medrol infusion. CD

## 2021-03-11 NOTE — Discharge Summary (Signed)
Physician Discharge Summary  Toni Weaver MVH:[id number] DOB: 1990-10-31 DOA: 03/09/2021  PCP: Harvie HeckJackson, Mishi, MD  Admit date: 03/09/2021 Discharge date: 03/11/2021  Admitted From: Home Disposition: Home  Recommendations for Outpatient Follow-up:  Follow up with PCP in 1 week Follow up with neurology Please follow up on the following pending results: None  Home Health: Outpatient PT/OT Equipment/Devices: None  Discharge Condition: Stable CODE STATUS: Full code Diet recommendation: Regular diet   Brief/Interim Summary:  Admission HPI written by Therisa DoyneAnastassia Doutova, MD   HPI: Toni Weaver is a 30 y.o. female with medical history significant of MS     Presented with   bilateral leg weakness worse than baseline and urinary incontinence Pt had Shingles vaccine few days ago and since then have had  worsening headache, weakness, episode of incontinence. Denies any fever no cough    Has  been vaccinated against COVID and boosted   Hospital course:  Multiple sclerosis exacerbation Patient given a dose of Solu-medrol 1g x1 on admission. Neurology consulted and are recommending reduced dose in setting of COVID-19 infection of Solu-medrol 250 mg daily MRI C and T spine consistent with possible acute disease vs improved imaging. Neurology's assessment that this was a pseudo exacerbation related to recent vaccination. After initial 1 g dose of solu-medrol, she received two doses of Solu-medrol 250 mg before being clear for discharge by neurology. Symptoms improved prior to discharge.   Hypokalemia Mild. Potassium supplementation given. Resolved.   COVID-19 virus infection Incidental and possibly related to previous infection three months prior in setting of immune compromised state. Completely asymptomatic. Chest x-ray with no pulmonary infiltrates. Paxlovid discontinued. Patient did not receive any doses.   Muscle spasms/pain Continue Baclofen   Reynaud's  disease Continue Verapamil   Depression Continue Lexapro  Discharge Diagnoses:  Active Problems:   Multiple sclerosis exacerbation (HCC)   Hypokalemia   COVID-19 virus infection    Discharge Instructions  Discharge Instructions     Ambulatory referral to Physical Therapy   Complete by: As directed    Outpatient neuro rehab      Allergies as of 03/11/2021   No Known Allergies      Medication List     STOP taking these medications    benzonatate 100 MG capsule Commonly known as: Tessalon Perles   methylPREDNISolone 4 MG Tbpk tablet Commonly known as: MEDROL DOSEPAK   predniSONE 50 MG tablet Commonly known as: DELTASONE       TAKE these medications    acetaminophen 500 MG tablet Commonly known as: TYLENOL Take 500 mg by mouth every 6 (six) hours as needed for moderate pain.   baclofen 10 MG tablet Commonly known as: LIORESAL Take 1 tablet (10 mg total) by mouth 3 (three) times daily.   diclofenac Sodium 1 % Gel Commonly known as: VOLTAREN Apply 4 g topically 4 (four) times daily as needed.   escitalopram 10 MG tablet Commonly known as: LEXAPRO Take 10 mg by mouth daily.   Kesimpta 20 MG/0.4ML Soaj Generic drug: Ofatumumab Inject 20 mg into the skin See admin instructions. Every 4 weeks   multivitamin tablet Take 1 tablet by mouth daily.   ondansetron 4 MG disintegrating tablet Commonly known as: Zofran ODT Take 1 tablet (4 mg total) by mouth every 8 (eight) hours as needed for nausea or vomiting.   verapamil 120 MG CR tablet Commonly known as: CALAN-SR Take 1 tablet (120 mg total) by mouth at bedtime.  ASK your doctor about these medications    albuterol 108 (90 Base) MCG/ACT inhaler Commonly known as: VENTOLIN HFA Inhale 2 puffs into the lungs every 6 (six) hours as needed for wheezing or shortness of breath.   fluticasone 50 MCG/ACT nasal spray Commonly known as: FLONASE Place 2 sprays into both nostrils daily.   tiZANidine  2 MG tablet Commonly known as: ZANAFLEX TAKE 1 TABLET BY MOUTH AT BEDTIME        Follow-up Information     [facility] Follow up.   Specialty: Physical Medicine and Rehabilitation Why: Your outpatient therapy has been set up with [facility]. The office will contact you with details of therapy. If you have any questions please call number listed above. Contact information: [address] [place] [address] [phone]         Harvie Heck, MD. Schedule an appointment as soon as possible for a visit in 1 week(s).   Specialty: Family Medicine Why: For wound re-check Contact information: [address] [place] [address] [place] [address] [phone]                No Known Allergies  Consultations: Neurology   Procedures/Studies: MR Brain W and Wo Contrast  Result Date: 03/10/2021 CLINICAL DATA:  Multiple sclerosis EXAM: MRI HEAD WITHOUT AND WITH CONTRAST TECHNIQUE: Multiplanar, multiecho pulse sequences of the brain and surrounding structures were obtained without and with intravenous contrast. CONTRAST:  84mL GADAVIST GADOBUTROL 1 MMOL/ML IV SOLN COMPARISON:  09/17/2020 FINDINGS: Brain: No acute infarct, mass effect or extra-axial collection. No acute or chronic hemorrhage. Unchanged distribution white matter lesions in a pattern consistent with chronic demyelination. There are no contrast-enhancing lesions to indicate active demyelination. There is no age advanced volume loss. Unchanged left middle cerebellar peduncle lesion. The midline structures are normal. Vascular: Major flow voids are preserved. Skull and upper cervical spine: Normal calvarium and skull base. Visualized upper cervical spine and soft tissues are normal. Sinuses/Orbits:No paranasal sinus fluid levels or advanced mucosal thickening. No mastoid or middle ear effusion. Normal orbits. IMPRESSION: Unchanged distribution of  white matter lesions in a pattern consistent with chronic demyelination. No contrast-enhancing lesions to indicate active demyelination. Electronically Signed   By: Deatra Robinson M.D.   On: 03/10/2021 01:33   MR CERVICAL SPINE W WO CONTRAST  Result Date: 03/10/2021 CLINICAL DATA:  Spinal stenosis, C-spine CSF leak suspected/ Spontaneous intracranial hypotension; Myelopathy, acute or progressive CSF leak/Spontaneous intracranial hypotension suspected Additional history: Per chart review patient has a history of multiple sclerosis with new left leg numbness and weakness after recent zoster vaccine. EXAM: MRI CERVICAL AND THORACIC SPINE WITHOUT AND WITH CONTRAST TECHNIQUE: Multiplanar and multiecho pulse sequences of the cervical spine, to include the craniocervical junction and cervicothoracic junction, and the thoracic spine, were obtained without and with intravenous contrast. CONTRAST:  75mL GADAVIST GADOBUTROL 1 MMOL/ML IV SOLN COMPARISON:  MRI of the cervical spine from November 12, 2015 FINDINGS: MRI CERVICAL SPINE FINDINGS Alignment: No substantial sagittal subluxation. Vertebrae: No evidence of acute fracture, discitis/osteomyelitis, or suspicious bone lesion. Cord: Numerous short-segment T2 hyperintense lesions throughout the cervical cord, including lesions at C2-C3, C3-C4, C5-C6, and C7-T1. No enhancing lesions. Mild cord atrophy at the cervicothoracic junction, likely progressed. Posterior Fossa, vertebral arteries, paraspinal tissues: Visualized vertebral artery flow voids are maintained. See same day MRI head for intracranial valuation. Disc levels: No significant disc protrusion, foraminal stenosis, or canal stenosis. MRI THORACIC SPINE FINDINGS  Alignment: Mildly exaggerated thoracic kyphosis. No substantial sagittal subluxation. 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 No suspicious bone lesions. No specific evidence of acute fracture or discitis/osteomyelitis. Cord: Numerous  short-segment T2 hyperintensities in the cord, including lesions at T2-T3, T3-T4, T4-T5, T6-T7, T10, T10-T11, and T11-T12. Axial postcontrast imaging is somewhat limited by motion without definite/convincing abnormal cord enhancement when correlating with the good quality postcontrast sagittal T1. Paraspinal and other soft tissues: Unremarkable. No paraspinal fluid collections. Disc levels: No significant disc protrusion, foraminal stenosis, or canal stenosis. IMPRESSION: MRI cervical spine: 1. Numerous short-segment T2 hyperintense lesions throughout the cervical cord, detailed above and compatible with prior demyelination. Many of these lesions are more conspicuous than on the 2017 prior suggesting possible progression; however, today's study is of significantly better quality and therefore direct comparison is limited. No enhancing lesions to suggest active demyelination. 2. Mild cord atrophy at the cervicothoracic junction, likely progressed. MRI thoracic spine: Numerous short-segment T2 hyperintense cord lesions throughout the thoracic cord, detailed above and compatible with prior demyelination. No prior study to assess for change. No convincing cord enhancement to suggest active demyelination. Electronically Signed   By: Feliberto Harts MD   On: 03/10/2021 16:38   MR THORACIC SPINE W WO CONTRAST  Result Date: 03/10/2021 CLINICAL DATA:  Spinal stenosis, C-spine CSF leak suspected/ Spontaneous intracranial hypotension; Myelopathy, acute or progressive CSF leak/Spontaneous intracranial hypotension suspected Additional history: Per chart review patient has a history of multiple sclerosis with new left leg numbness and weakness after recent zoster vaccine. EXAM: MRI CERVICAL AND THORACIC SPINE WITHOUT AND WITH CONTRAST TECHNIQUE: Multiplanar and multiecho pulse sequences of the cervical spine, to include the craniocervical junction and cervicothoracic junction, and the thoracic spine, were obtained without  and with intravenous contrast. CONTRAST:  100mL GADAVIST GADOBUTROL 1 MMOL/ML IV SOLN COMPARISON:  MRI of the cervical spine from November 12, 2015 FINDINGS: MRI CERVICAL SPINE FINDINGS Alignment: No substantial sagittal subluxation. Vertebrae: No evidence of acute fracture, discitis/osteomyelitis, or suspicious bone lesion. Cord: Numerous short-segment T2 hyperintense lesions throughout the cervical cord, including lesions at C2-C3, C3-C4, C5-C6, and C7-T1. No enhancing lesions. Mild cord atrophy at the cervicothoracic junction, likely progressed. Posterior Fossa, vertebral arteries, paraspinal tissues: Visualized vertebral artery flow voids are maintained. See same day MRI head for intracranial valuation. Disc levels: No significant disc protrusion, foraminal stenosis, or canal stenosis. MRI THORACIC SPINE FINDINGS Alignment: Mildly exaggerated thoracic kyphosis. No substantial sagittal subluxation. 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 No suspicious bone lesions. No specific evidence of acute fracture or discitis/osteomyelitis. Cord: Numerous short-segment T2 hyperintensities in the cord, including lesions at T2-T3, T3-T4, T4-T5, T6-T7, T10, T10-T11, and T11-T12. Axial postcontrast imaging is somewhat limited by motion without definite/convincing abnormal cord enhancement when correlating with the good quality postcontrast sagittal T1. Paraspinal and other soft tissues: Unremarkable. No paraspinal fluid collections. Disc levels: No significant disc protrusion, foraminal stenosis, or canal stenosis. IMPRESSION: MRI cervical spine: 1. Numerous short-segment T2 hyperintense lesions throughout the cervical cord, detailed above and compatible with prior demyelination. Many of these lesions are more conspicuous than on the 2017 prior suggesting possible progression; however, today's study is of significantly better quality and therefore direct comparison is limited. No enhancing lesions to suggest active  demyelination. 2. Mild cord atrophy at the cervicothoracic junction, likely progressed. MRI thoracic spine: Numerous short-segment T2 hyperintense cord lesions throughout the thoracic cord, detailed above and compatible with prior demyelination. No prior study to assess for change. No convincing cord enhancement to  suggest active demyelination. Electronically Signed   By: Feliberto Harts MD   On: 03/10/2021 16:38   DG CHEST PORT 1 VIEW  Result Date: 03/10/2021 CLINICAL DATA:  COVID-19. EXAM: PORTABLE CHEST 1 VIEW COMPARISON:  10/02/2016 FINDINGS: Normal heart size and mediastinal contours. No acute infiltrate or edema. No effusion or pneumothorax. No acute osseous findings. Mild thoracic dextrocurvature. IMPRESSION: Negative for pneumonia. Electronically Signed   By: Marnee Spring M.D.   On: 03/10/2021 04:19      Subjective: No issues overnight. Feels like he weakness has lessened  Discharge Exam: Vitals:   03/11/21 0459 03/11/21 0845  BP:  104/68  Pulse:    Resp: 19 17  Temp:  97.8 F (36.6 C)  SpO2: 98% 100%   Vitals:   03/10/21 2359 03/11/21 0354 03/11/21 0459 03/11/21 0845  BP:  (!) 103/55  104/68  Pulse:  62    Resp: 15 18 19 17   Temp:  97.9 F (36.6 C)  97.8 F (36.6 C)  TempSrc:  Oral  Axillary  SpO2: 98%  98% 100%  Weight:      Height:        General: Well appearing, no distress Respiratory:  Unlabored work of breathing.   The results of significant diagnostics from this hospitalization (including imaging, microbiology, ancillary and laboratory) are listed below for reference.     Microbiology: Recent Results (from the past 240 hour(s))  Resp Panel by RT-PCR (Flu A&B, Covid) Nasopharyngeal Swab     Status: Abnormal   Collection Time: 03/09/21  9:40 PM   Specimen: Nasopharyngeal Swab; Nasopharyngeal(NP) swabs in vial transport medium  Result Value Ref Range Status   SARS Coronavirus 2 by RT PCR POSITIVE (A) NEGATIVE Final    Comment: RESULT CALLED TO, READ  BACK BY AND VERIFIED WITH: R SANGLANG,RN@2319  03/09/21 MKELLY (NOTE) SARS-CoV-2 target nucleic acids are DETECTED.  The SARS-CoV-2 RNA is generally detectable in upper respiratory specimens during the acute phase of infection. Positive results are indicative of the presence of the identified virus, but do not rule out bacterial infection or co-infection with other pathogens not detected by the test. Clinical correlation with patient history and other diagnostic information is necessary to determine patient infection status. The expected result is Negative.  Fact Sheet for Patients: 05/09/21  Fact Sheet for Healthcare Providers: BloggerCourse.com  This test is not yet approved or cleared by the [place] FDA and  has been authorized for detection and/or diagnosis of SARS-CoV-2 by FDA under an Emergency Use Authorization (EUA).  This EUA will remain in effect (meaning this test can b e used) for the duration of  the COVID-19 declaration under Section 564(b)(1) of the Act, 21 U.S.C. section 360bbb-3(b)(1), unless the authorization is terminated or revoked sooner.     Influenza A by PCR NEGATIVE NEGATIVE Final   Influenza B by PCR NEGATIVE NEGATIVE Final    Comment: (NOTE) The Xpert Xpress SARS-CoV-2/FLU/RSV plus assay is intended as an aid in the diagnosis of influenza from Nasopharyngeal swab specimens and should not be used as a sole basis for treatment. Nasal washings and aspirates are unacceptable for Xpert Xpress SARS-CoV-2/FLU/RSV testing.  Fact Sheet for Patients: [place]  Fact Sheet for Healthcare Providers: BloggerCourse.com  This test is not yet approved or cleared by the [place] FDA and has been authorized for detection and/or diagnosis of SARS-CoV-2 by FDA under an Emergency Use Authorization (EUA). This EUA will remain in effect (meaning  this test can be used) for  the duration of the COVID-19 declaration under Section 564(b)(1) of the Act, 21 U.S.C. section 360bbb-3(b)(1), unless the authorization is terminated or revoked.  Performed at [facility], [address], [place], [place] [address]      Labs: BNP (last 3 results) No results for input(s): BNP in the last 8760 hours. Basic Metabolic Panel: Recent Labs  Lab 03/09/21 1543 03/10/21 0300 03/10/21 0303  NA 134*  --  136  K 3.4*  --  4.2  CL 101  --  105  CO2 27  --  25  GLUCOSE 95  --  154*  BUN 11  --  12  CREATININE 0.70  --  0.67  CALCIUM 8.9  --  9.0  MG  --  2.0 1.9  PHOS  --  2.5 2.5   Liver Function Tests: Recent Labs  Lab 03/10/21 0300 03/10/21 0303  AST 17 18  ALT 14 14  ALKPHOS 35* 36*  BILITOT 0.7 0.7  PROT 6.6 6.5  ALBUMIN 3.5 3.6   No results for input(s): LIPASE, AMYLASE in the last 168 hours. No results for input(s): AMMONIA in the last 168 hours. CBC: Recent Labs  Lab 03/09/21 1543 03/10/21 0300  WBC 6.2 5.7  NEUTROABS  --  5.0  HGB 11.7* 12.3  HCT 32.8* 33.6*  MCV 79.0* 78.3*  PLT 292 279   Cardiac Enzymes: No results for input(s): CKTOTAL, CKMB, CKMBINDEX, TROPONINI in the last 168 hours. BNP: Invalid input(s): POCBNP CBG: No results for input(s): GLUCAP in the last 168 hours. D-Dimer No results for input(s): DDIMER in the last 72 hours. Hgb A1c No results for input(s): HGBA1C in the last 72 hours. Lipid Profile No results for input(s): CHOL, HDL, LDLCALC, TRIG, CHOLHDL, LDLDIRECT in the last 72 hours. Thyroid function studies Recent Labs    03/10/21 0303  TSH 2.006   Anemia work up Recent Labs    03/10/21 0300  FERRITIN 44   Urinalysis    Component Value Date/Time   APPEARANCEUR Clear 02/28/2013 1143   GLUCOSEU Negative 02/28/2013 1143   BILIRUBINUR Negative 02/28/2013 1143   PROTEINUR Negative 02/28/2013 1143   NITRITE Negative 02/28/2013 1143   LEUKOCYTESUR Negative 02/28/2013 1143    Sepsis Labs Invalid input(s): PROCALCITONIN,  WBC,  LACTICIDVEN Microbiology Recent Results (from the past 240 hour(s))  Resp Panel by RT-PCR (Flu A&B, Covid) Nasopharyngeal Swab     Status: Abnormal   Collection Time: 03/09/21  9:40 PM   Specimen: Nasopharyngeal Swab; Nasopharyngeal(NP) swabs in vial transport medium  Result Value Ref Range Status   SARS Coronavirus 2 by RT PCR POSITIVE (A) NEGATIVE Final    Comment: RESULT CALLED TO, READ BACK BY AND VERIFIED WITH: R SANGLANG,RN@2319  03/09/21 MKELLY (NOTE) SARS-CoV-2 target nucleic acids are DETECTED.  The SARS-CoV-2 RNA is generally detectable in upper respiratory specimens during the acute phase of infection. Positive results are indicative of the presence of the identified virus, but do not rule out bacterial infection or co-infection with other pathogens not detected by the test. Clinical correlation with patient history and other diagnostic information is necessary to determine patient infection status. The expected result is Negative.  Fact Sheet for Patients: BloggerCourse.com  Fact Sheet for Healthcare Providers: [employer]iousBroker.it  This test is not yet approved or cleared by the [place] FDA and  has been authorized for detection and/or diagnosis of SARS-CoV-2 by FDA under an Emergency Use Authorization (EUA).  This EUA will remain in effect (meaning this test can  b e used) for the duration of  the COVID-19 declaration under Section 564(b)(1) of the Act, 21 U.S.C. section 360bbb-3(b)(1), unless the authorization is terminated or revoked sooner.     Influenza A by PCR NEGATIVE NEGATIVE Final   Influenza B by PCR NEGATIVE NEGATIVE Final    Comment: (NOTE) The Xpert Xpress SARS-CoV-2/FLU/RSV plus assay is intended as an aid in the diagnosis of influenza from Nasopharyngeal swab specimens and should not be used as a sole basis for treatment. Nasal washings  and aspirates are unacceptable for Xpert Xpress SARS-CoV-2/FLU/RSV testing.  Fact Sheet for Patients: BloggerCourse.com  Fact Sheet for Healthcare Providers: [employer]iousBroker.it  This test is not yet approved or cleared by the [place] FDA and has been authorized for detection and/or diagnosis of SARS-CoV-2 by FDA under an Emergency Use Authorization (EUA). This EUA will remain in effect (meaning this test can be used) for the duration of the COVID-19 declaration under Section 564(b)(1) of the Act, 21 U.S.C. section 360bbb-3(b)(1), unless the authorization is terminated or revoked.  Performed at [facility], [address], [place], [place] [address]      Time coordinating discharge: 35 minutes  SIGNED:   Jacquelin Hawking, MD [facility] 03/11/2021, 6:11 PM

## 2021-03-11 NOTE — Progress Notes (Signed)
Neurology Progress Note  S:// Seen and examined Received 1 dose of 1000 mg Solu-Medrol IV followed by 2 doses of to 50 mg Solu-Medrol IV.  Reports subjective improvement   O:// Current vital signs: BP 104/68 (BP Location: Left Arm)   Pulse 62   Temp 97.8 F (36.6 C) (Axillary)   Resp 17   Ht 5\' 3"  (1.6 m)   Wt 59.8 kg   LMP 02/23/2021 (Approximate)   SpO2 100%   BMI 23.35 kg/m  Vital signs in last 24 hours: Temp:  [97.8 F (36.6 C)-98.2 F (36.8 C)] 97.8 F (36.6 C) (08/08 0845) Pulse Rate:  [62-67] 62 (08/08 0354) Resp:  [15-19] 17 (08/08 0845) BP: (103-118)/(55-79) 104/68 (08/08 0845) SpO2:  [98 %-100 %] 100 % (08/08 0845) Weight:  [59.8 kg] 59.8 kg (08/07 2042) General: Well-developed well-nourished in no distress HEENT: Normocephalic/atraumatic CVS: Regular rhythm Respiratory: Breathing well saturating normally on room air Abdomen nondistended nontender Neurologic exam Alert awake oriented x3 Aphasia No dysarthria Cranial nerves II to XII intact Motor exam with 4/5 bilateral lower extremity weakness with left leg with subtle worse weakness than right. Sensation intact DTRs 2+  Medications  Current Facility-Administered Medications:    0.9 %  sodium chloride infusion, 250 mL, Intravenous, PRN, 2043, Anastassia, MD   acetaminophen (TYLENOL) tablet 650 mg, 650 mg, Oral, Q6H PRN **OR** acetaminophen (TYLENOL) suppository 650 mg, 650 mg, Rectal, Q6H PRN, Adela Glimpse, Anastassia, MD   baclofen (LIORESAL) tablet 10 mg, 10 mg, Oral, TID, Adela Glimpse, MD, 10 mg at 03/11/21 1410   escitalopram (LEXAPRO) tablet 10 mg, 10 mg, Oral, QHS, 05/11/21, MD   heparin injection 5,000 Units, 5,000 Units, Subcutaneous, Q8H, Doutova, Anastassia, MD, 5,000 Units at 03/11/21 1410   HYDROcodone-acetaminophen (NORCO/VICODIN) 5-325 MG per tablet 1-2 tablet, 1-2 tablet, Oral, Q4H PRN, Doutova, Anastassia, MD   methylPREDNISolone sodium succinate (SOLU-MEDROL) 250 mg in sodium  chloride 0.9 % 50 mL IVPB, 250 mg, Intravenous, Daily, 05/11/21, MD, Last Rate: 104 mL/hr at 03/11/21 0858, 250 mg at 03/11/21 0858   ondansetron (ZOFRAN) injection 4 mg, 4 mg, Intravenous, Q6H PRN, 05/11/21, MD, 4 mg at 03/10/21 1645   sodium chloride flush (NS) 0.9 % injection 3 mL, 3 mL, Intravenous, Q12H, Doutova, Anastassia, MD, 3 mL at 03/11/21 0850   sodium chloride flush (NS) 0.9 % injection 3 mL, 3 mL, Intravenous, PRN, Doutova, Anastassia, MD   tiZANidine (ZANAFLEX) tablet 2 mg, 2 mg, Oral, QHS PRN, Doutova, Anastassia, MD   verapamil (CALAN-SR) CR tablet 120 mg, 120 mg, Oral, Daily, 05/11/21, MD, 120 mg at 03/11/21 0855 Labs CBC    Component Value Date/Time   WBC 5.7 03/10/2021 0300   RBC 4.29 03/10/2021 0300   HGB 12.3 03/10/2021 0300   HGB 13.7 11/06/2020 0844   HCT 33.6 (L) 03/10/2021 0300   HCT 41.1 11/06/2020 0844   PLT 279 03/10/2021 0300   PLT 304 11/06/2020 0844   MCV 78.3 (L) 03/10/2021 0300   MCV 84 11/06/2020 0844   MCH 28.7 03/10/2021 0300   MCHC 36.6 (H) 03/10/2021 0300   RDW 13.3 03/10/2021 0300   RDW 14.5 11/06/2020 0844   LYMPHSABS 0.3 (L) 03/10/2021 0300   LYMPHSABS 3.3 (H) 11/06/2020 0844   MONOABS 0.2 03/10/2021 0300   EOSABS 0.3 03/10/2021 0300   EOSABS 0.1 11/06/2020 0844   BASOSABS 0.0 03/10/2021 0300   BASOSABS 0.1 11/06/2020 0844    CMP  Component Value Date/Time   NA 136 03/10/2021 0303   NA 141 10/25/2019 1647   K 4.2 03/10/2021 0303   CL 105 03/10/2021 0303   CO2 25 03/10/2021 0303   GLUCOSE 154 (H) 03/10/2021 0303   BUN 12 03/10/2021 0303   BUN 12 10/25/2019 1647   CREATININE 0.67 03/10/2021 0303   CALCIUM 9.0 03/10/2021 0303   PROT 6.5 03/10/2021 0303   PROT 7.1 11/06/2020 0844   ALBUMIN 3.6 03/10/2021 0303   ALBUMIN 4.8 11/06/2020 0844   AST 18 03/10/2021 0303   ALT 14 03/10/2021 0303   ALKPHOS 36 (L) 03/10/2021 0303   BILITOT 0.7 03/10/2021 0303   BILITOT 0.2 11/06/2020 0844   GFRNONAA >60  03/10/2021 0303   GFRAA 133 10/25/2019 1647   Imaging I have reviewed images in epic and the results pertinent to this consultation are: MRI of thoracic and cervical spine IMPRESSION: MRI cervical spine:   1. Numerous short-segment T2 hyperintense lesions throughout the cervical cord, detailed above and compatible with prior demyelination. Many of these lesions are more conspicuous than on the 2017 prior suggesting possible progression; however, today's study is of significantly better quality and therefore direct comparison is limited. No enhancing lesions to suggest active demyelination. 2. Mild cord atrophy at the cervicothoracic junction, likely progressed.   MRI thoracic spine:   Numerous short-segment T2 hyperintense cord lesions throughout the thoracic cord, detailed above and compatible with prior demyelination. No prior study to assess for change. No convincing cord enhancement to suggest active demyelination.   MRI of the brainIMPRESSION: Unchanged distribution of white matter lesions in a pattern consistent with chronic demyelination. No contrast-enhancing lesions to indicate active demyelination.      Assessment: Likely pseudo exacerbation of multiple sclerosis in the setting of recent vaccination and COVID infection which is asymptomatic.   Recommendations: Completed an abbreviated course of IV steroids. Continue outpatient PT Follow-up with Dr. Epimenio Foot as outpatient-has an appointment in October. Discussed the plan with Dr. Caleb Popp, patient's mother at bedside and patient.  -- Milon Dikes, MD Neurologist [facility] Pager: [phone]

## 2021-03-11 NOTE — Plan of Care (Signed)

## 2021-03-11 NOTE — Progress Notes (Addendum)
PROGRESS NOTE    Toni Weaver  OZH:[id number] DOB: [birth year] DOA: 03/09/2021 PCP: Harvie Heck, MD   Brief Narrative: Toni Weaver is a 30 y.o. female with a history of multiple sclerosis.  Patient presented secondary to worsening bilateral lower extremity weakness and urinary incontinence, concerning for multiple sclerosis exacerbation.  In setting of recent shingles vaccine.  Patient given a dose of 1 g Solu-Medrol on admission and neurology was consulted who recommended continued Solu-Medrol to 50 mg daily.  Patient found to be incidentally COVID-positive.   Assessment & Plan:   Active Problems:   Multiple sclerosis exacerbation (HCC)   Hypokalemia   COVID-19 virus infection  Multiple sclerosis exacerbation Patient given a dose of Solu-medrol 1g x1 on admission. Neurology consulted and are recommending reduced dose in setting of COVID-19 infection of Solu-medrol 250 mg daily MRI C and T spine consistent with possible acute disease vs improved imaging. Will need to await neurology assessment -Neurology recommendations: Solu-medrol 250 mg daily, pending today  Hypokalemia Mild. Potassium supplementation given. Resolved.  COVID-19 virus infection Incidental and possibly related to previous infection three months prior in setting of immune compromised state. Completely asymptomatic. Chest x-ray with no pulmonary infiltrates. Paxlovid discontinued. Patient did not receive any doses.  Muscle spasms/pain -Continue Baclofen  Reynaud's disease -Continue Verapamil (morning dosing)  Depression -Continue Lexapro (evening dosing)   DVT prophylaxis: Lovenox Code Status:   Code Status: Full Code Family Communication: Mother at bedside Disposition Plan: Discharge home pending neurology recommendations. Between 1-4 days   Consultants:  Neurology  Procedures:  None  Antimicrobials: None    Subjective: Weakness seems to be improved today.  Objective: Vitals:    03/10/21 2359 03/11/21 0354 03/11/21 0459 03/11/21 0845  BP:  (!) 103/55  104/68  Pulse:  62    Resp: 15 18 19 17   Temp:  97.9 F (36.6 C)  97.8 F (36.6 C)  TempSrc:  Oral  Axillary  SpO2: 98%  98% 100%  Weight:      Height:        Intake/Output Summary (Last 24 hours) at 03/11/2021 1348 Last data filed at 03/11/2021 1002 Gross per 24 hour  Intake 530 ml  Output --  Net 530 ml    Filed Weights   03/09/21 1531 03/10/21 2042  Weight: 61.2 kg 59.8 kg    Examination:   General: Well appearing, no distress Respiratory:  Unlabored work of breathing.    Data Reviewed: I have personally reviewed following labs and imaging studies  CBC Lab Results  Component Value Date   WBC 5.7 03/10/2021   RBC 4.29 03/10/2021   HGB 12.3 03/10/2021   HCT 33.6 (L) 03/10/2021   MCV 78.3 (L) 03/10/2021   MCH 28.7 03/10/2021   PLT 279 03/10/2021   MCHC 36.6 (H) 03/10/2021   RDW 13.3 03/10/2021   LYMPHSABS 0.3 (L) 03/10/2021   MONOABS 0.2 03/10/2021   EOSABS 0.3 03/10/2021   BASOSABS 0.0 03/10/2021     Last metabolic panel Lab Results  Component Value Date   NA 136 03/10/2021   K 4.2 03/10/2021   CL 105 03/10/2021   CO2 25 03/10/2021   BUN 12 03/10/2021   CREATININE 0.67 03/10/2021   GLUCOSE 154 (H) 03/10/2021   GFRNONAA >60 03/10/2021   GFRAA 133 10/25/2019   CALCIUM 9.0 03/10/2021   PHOS 2.5 03/10/2021   PROT 6.5 03/10/2021   ALBUMIN 3.6 03/10/2021   LABGLOB 2.2 10/25/2019   AGRATIO 2.0 10/25/2019  BILITOT 0.7 03/10/2021   ALKPHOS 36 (L) 03/10/2021   AST 18 03/10/2021   ALT 14 03/10/2021   ANIONGAP 6 03/10/2021    CBG (last 3)  No results for input(s): GLUCAP in the last 72 hours.   GFR: Estimated Creatinine Clearance: 85.1 mL/min (by C-G formula based on SCr of 0.67 mg/dL).  Coagulation Profile: No results for input(s): INR, PROTIME in the last 168 hours.  Recent Results (from the past 240 hour(s))  Resp Panel by RT-PCR (Flu A&B, Covid) Nasopharyngeal Swab      Status: Abnormal   Collection Time: 03/09/21  9:40 PM   Specimen: Nasopharyngeal Swab; Nasopharyngeal(NP) swabs in vial transport medium  Result Value Ref Range Status   SARS Coronavirus 2 by RT PCR POSITIVE (A) NEGATIVE Final    Comment: RESULT CALLED TO, READ BACK BY AND VERIFIED WITH: R SANGLANG,RN@2319  03/09/21 MKELLY (NOTE) SARS-CoV-2 target nucleic acids are DETECTED.  The SARS-CoV-2 RNA is generally detectable in upper respiratory specimens during the acute phase of infection. Positive results are indicative of the presence of the identified virus, but do not rule out bacterial infection or co-infection with other pathogens not detected by the test. Clinical correlation with patient history and other diagnostic information is necessary to determine patient infection status. The expected result is Negative.  Fact Sheet for Patients: BloggerCourse.com  Fact Sheet for Healthcare Providers: SeriousBroker.it  This test is not yet approved or cleared by the [place] FDA and  has been authorized for detection and/or diagnosis of SARS-CoV-2 by FDA under an Emergency Use Authorization (EUA).  This EUA will remain in effect (meaning this test can b e used) for the duration of  the COVID-19 declaration under Section 564(b)(1) of the Act, 21 U.S.C. section 360bbb-3(b)(1), unless the authorization is terminated or revoked sooner.     Influenza A by PCR NEGATIVE NEGATIVE Final   Influenza B by PCR NEGATIVE NEGATIVE Final    Comment: (NOTE) The Xpert Xpress SARS-CoV-2/FLU/RSV plus assay is intended as an aid in the diagnosis of influenza from Nasopharyngeal swab specimens and should not be used as a sole basis for treatment. Nasal washings and aspirates are unacceptable for Xpert Xpress SARS-CoV-2/FLU/RSV testing.  Fact Sheet for Patients: BloggerCourse.com  Fact Sheet for Healthcare  Providers: SeriousBroker.it  This test is not yet approved or cleared by the [place] FDA and has been authorized for detection and/or diagnosis of SARS-CoV-2 by FDA under an Emergency Use Authorization (EUA). This EUA will remain in effect (meaning this test can be used) for the duration of the COVID-19 declaration under Section 564(b)(1) of the Act, 21 U.S.C. section 360bbb-3(b)(1), unless the authorization is terminated or revoked.  Performed at [facility], [address] [place], [place] [address]         Radiology Studies: MR Brain W and Wo Contrast  Result Date: 03/10/2021 CLINICAL DATA:  Multiple sclerosis EXAM: MRI HEAD WITHOUT AND WITH CONTRAST TECHNIQUE: Multiplanar, multiecho pulse sequences of the brain and surrounding structures were obtained without and with intravenous contrast. CONTRAST:  18mL GADAVIST GADOBUTROL 1 MMOL/ML IV SOLN COMPARISON:  09/17/2020 FINDINGS: Brain: No acute infarct, mass effect or extra-axial collection. No acute or chronic hemorrhage. Unchanged distribution white matter lesions in a pattern consistent with chronic demyelination. There are no contrast-enhancing lesions to indicate active demyelination. There is no age advanced volume loss. Unchanged left middle cerebellar peduncle lesion. The midline structures are normal. Vascular: Major flow voids are preserved. Skull and upper cervical spine:  Normal calvarium and skull base. Visualized upper cervical spine and soft tissues are normal. Sinuses/Orbits:No paranasal sinus fluid levels or advanced mucosal thickening. No mastoid or middle ear effusion. Normal orbits. IMPRESSION: Unchanged distribution of white matter lesions in a pattern consistent with chronic demyelination. No contrast-enhancing lesions to indicate active demyelination. Electronically Signed   By: Deatra Robinson M.D.   On: 03/10/2021 01:33   MR CERVICAL SPINE W WO CONTRAST  Result Date:  03/10/2021 CLINICAL DATA:  Spinal stenosis, C-spine CSF leak suspected/ Spontaneous intracranial hypotension; Myelopathy, acute or progressive CSF leak/Spontaneous intracranial hypotension suspected Additional history: Per chart review patient has a history of multiple sclerosis with new left leg numbness and weakness after recent zoster vaccine. EXAM: MRI CERVICAL AND THORACIC SPINE WITHOUT AND WITH CONTRAST TECHNIQUE: Multiplanar and multiecho pulse sequences of the cervical spine, to include the craniocervical junction and cervicothoracic junction, and the thoracic spine, were obtained without and with intravenous contrast. CONTRAST:  42mL GADAVIST GADOBUTROL 1 MMOL/ML IV SOLN COMPARISON:  MRI of the cervical spine from November 12, 2015 FINDINGS: MRI CERVICAL SPINE FINDINGS Alignment: No substantial sagittal subluxation. Vertebrae: No evidence of acute fracture, discitis/osteomyelitis, or suspicious bone lesion. Cord: Numerous short-segment T2 hyperintense lesions throughout the cervical cord, including lesions at C2-C3, C3-C4, C5-C6, and C7-T1. No enhancing lesions. Mild cord atrophy at the cervicothoracic junction, likely progressed. Posterior Fossa, vertebral arteries, paraspinal tissues: Visualized vertebral artery flow voids are maintained. See same day MRI head for intracranial valuation. Disc levels: No significant disc protrusion, foraminal stenosis, or canal stenosis. MRI THORACIC SPINE FINDINGS Alignment: Mildly exaggerated thoracic kyphosis. No substantial sagittal subluxation. 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 No suspicious bone lesions. No specific evidence of acute fracture or discitis/osteomyelitis. Cord: Numerous short-segment T2 hyperintensities in the cord, including lesions at T2-T3, T3-T4, T4-T5, T6-T7, T10, T10-T11, and T11-T12. Axial postcontrast imaging is somewhat limited by motion without definite/convincing abnormal cord enhancement when correlating with the good  quality postcontrast sagittal T1. Paraspinal and other soft tissues: Unremarkable. No paraspinal fluid collections. Disc levels: No significant disc protrusion, foraminal stenosis, or canal stenosis. IMPRESSION: MRI cervical spine: 1. Numerous short-segment T2 hyperintense lesions throughout the cervical cord, detailed above and compatible with prior demyelination. Many of these lesions are more conspicuous than on the 2017 prior suggesting possible progression; however, today's study is of significantly better quality and therefore direct comparison is limited. No enhancing lesions to suggest active demyelination. 2. Mild cord atrophy at the cervicothoracic junction, likely progressed. MRI thoracic spine: Numerous short-segment T2 hyperintense cord lesions throughout the thoracic cord, detailed above and compatible with prior demyelination. No prior study to assess for change. No convincing cord enhancement to suggest active demyelination. Electronically Signed   By: Feliberto Harts MD   On: 03/10/2021 16:38   MR THORACIC SPINE W WO CONTRAST  Result Date: 03/10/2021 CLINICAL DATA:  Spinal stenosis, C-spine CSF leak suspected/ Spontaneous intracranial hypotension; Myelopathy, acute or progressive CSF leak/Spontaneous intracranial hypotension suspected Additional history: Per chart review patient has a history of multiple sclerosis with new left leg numbness and weakness after recent zoster vaccine. EXAM: MRI CERVICAL AND THORACIC SPINE WITHOUT AND WITH CONTRAST TECHNIQUE: Multiplanar and multiecho pulse sequences of the cervical spine, to include the craniocervical junction and cervicothoracic junction, and the thoracic spine, were obtained without and with intravenous contrast. CONTRAST:  40mL GADAVIST GADOBUTROL 1 MMOL/ML IV SOLN COMPARISON:  MRI of the cervical spine from November 12, 2015 FINDINGS: MRI CERVICAL SPINE FINDINGS Alignment: No substantial sagittal  subluxation. Vertebrae: No evidence of acute  fracture, discitis/osteomyelitis, or suspicious bone lesion. Cord: Numerous short-segment T2 hyperintense lesions throughout the cervical cord, including lesions at C2-C3, C3-C4, C5-C6, and C7-T1. No enhancing lesions. Mild cord atrophy at the cervicothoracic junction, likely progressed. Posterior Fossa, vertebral arteries, paraspinal tissues: Visualized vertebral artery flow voids are maintained. See same day MRI head for intracranial valuation. Disc levels: No significant disc protrusion, foraminal stenosis, or canal stenosis. MRI THORACIC SPINE FINDINGS Alignment: Mildly exaggerated thoracic kyphosis. No substantial sagittal subluxation. 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 No suspicious bone lesions. No specific evidence of acute fracture or discitis/osteomyelitis. Cord: Numerous short-segment T2 hyperintensities in the cord, including lesions at T2-T3, T3-T4, T4-T5, T6-T7, T10, T10-T11, and T11-T12. Axial postcontrast imaging is somewhat limited by motion without definite/convincing abnormal cord enhancement when correlating with the good quality postcontrast sagittal T1. Paraspinal and other soft tissues: Unremarkable. No paraspinal fluid collections. Disc levels: No significant disc protrusion, foraminal stenosis, or canal stenosis. IMPRESSION: MRI cervical spine: 1. Numerous short-segment T2 hyperintense lesions throughout the cervical cord, detailed above and compatible with prior demyelination. Many of these lesions are more conspicuous than on the 2017 prior suggesting possible progression; however, today's study is of significantly better quality and therefore direct comparison is limited. No enhancing lesions to suggest active demyelination. 2. Mild cord atrophy at the cervicothoracic junction, likely progressed. MRI thoracic spine: Numerous short-segment T2 hyperintense cord lesions throughout the thoracic cord, detailed above and compatible with prior demyelination. No prior study  to assess for change. No convincing cord enhancement to suggest active demyelination. Electronically Signed   By: Feliberto Harts MD   On: 03/10/2021 16:38   DG CHEST PORT 1 VIEW  Result Date: 03/10/2021 CLINICAL DATA:  COVID-19. EXAM: PORTABLE CHEST 1 VIEW COMPARISON:  10/02/2016 FINDINGS: Normal heart size and mediastinal contours. No acute infiltrate or edema. No effusion or pneumothorax. No acute osseous findings. Mild thoracic dextrocurvature. IMPRESSION: Negative for pneumonia. Electronically Signed   By: Marnee Spring M.D.   On: 03/10/2021 04:19        Scheduled Meds:  baclofen  10 mg Oral TID   escitalopram  10 mg Oral QHS   heparin  5,000 Units Subcutaneous Q8H   sodium chloride flush  3 mL Intravenous Q12H   verapamil  120 mg Oral Daily   Continuous Infusions:  sodium chloride     methylPREDNISolone (SOLU-MEDROL) injection 250 mg (03/11/21 0858)     LOS: 1 day     Jacquelin Hawking, MD [facility] 03/11/2021, 1:48 PM  If 7PM-7AM, please contact night-coverage [email or web address]

## 2021-04-05 ENCOUNTER — Other Ambulatory Visit: Payer: Self-pay | Admitting: Neurology

## 2021-04-09 ENCOUNTER — Other Ambulatory Visit: Payer: Self-pay | Admitting: *Deleted

## 2021-04-09 MED ORDER — BACLOFEN 10 MG PO TABS: 10.0000 mg | ORAL_TABLET | Freq: Three times a day (TID) | ORAL | 3 refills | Status: DC

## 2021-04-27 ENCOUNTER — Other Ambulatory Visit: Payer: Self-pay | Admitting: Neurology

## 2021-05-08 ENCOUNTER — Encounter: Payer: Self-pay | Admitting: Neurology

## 2021-05-08 ENCOUNTER — Telehealth: Payer: Self-pay | Admitting: Neurology

## 2021-05-08 ENCOUNTER — Ambulatory Visit (INDEPENDENT_AMBULATORY_CARE_PROVIDER_SITE_OTHER): Payer: Managed Care, Other (non HMO) | Admitting: Neurology

## 2021-05-08 VITALS — BP 112/71 | HR 67 | Ht 63.0 in | Wt 136.0 lb

## 2021-05-08 DIAGNOSIS — Z79899 Other long term (current) drug therapy: Secondary | ICD-10-CM

## 2021-05-08 DIAGNOSIS — G35 Multiple sclerosis: Secondary | ICD-10-CM | POA: Diagnosis not present

## 2021-05-08 DIAGNOSIS — I7300 Raynaud's syndrome without gangrene: Secondary | ICD-10-CM | POA: Diagnosis not present

## 2021-05-08 DIAGNOSIS — M21372 Foot drop, left foot: Secondary | ICD-10-CM

## 2021-05-08 DIAGNOSIS — R269 Unspecified abnormalities of gait and mobility: Secondary | ICD-10-CM | POA: Diagnosis not present

## 2021-05-08 DIAGNOSIS — R252 Cramp and spasm: Secondary | ICD-10-CM

## 2021-05-08 NOTE — Telephone Encounter (Signed)
Referral sent to [facility] as requested by patient/Dr. Epimenio Foot. Phone: [phone].

## 2021-05-08 NOTE — Progress Notes (Signed)
[facility]  PATIENT: Toni Weaver DOB: 06-06-1991  REFERRING DOCTOR OR PCP: PCP is Tracey Harries, MD SOURCE: Patient, notes from Dr. Anne Hahn, imaging and lab reports, MRI images personally reviewed.  _________________________________   HISTORICAL  CHIEF COMPLAINT:  Chief Complaint  Patient presents with   Follow-up    RM 2, alone. Last seen 12/24/20. MS- on kesimpta. Ambulates w/ cane now. Doing PT at [facility]. Almost finished but would like to do some more PT.    HISTORY OF PRESENT ILLNESS:  Toni Mane is a 30 year-old woman with relapsing remitting multiple sclerosis who is currently on Kesimpta.  Update 12/24/2020: She switched to Kesimpta from Tysabri earlier this year.   She is tolerating it well.  She has no exacerbation but notes balance is mildly worse.    She uses a cane due to left leg weakness and reduced balance.   She has a left foot drop.  She uses a bannister on stairs.   She has had a left foot drop x 2+ years. Her right leg and both arms/hands feel fine.    She notes some spasticity in her legs.  Sometimes this can be painful.  Baclofen helps some   Tizanidine helps bt makes her sleepy so she just takes prn.     She has trouble raising the left leg up and is working with PT with this issue.     She had the 1st shingles vaccine in August and had a relapse.    She did not get the second one.    Due to te relapse she went to the ED and was admitted for IV steroids.  She denies any numbness or tingling.    She has an irregular bowel pattern with frequebt constipation despite Miralax.   She has had very rare fecal incontinence.   She has rare urinary urge incontinence ad has urgency/frequency.     Vision is doing well  Verapamil was added for Raynaud's syndrome with benefit  She has mild fatigue and works out less because of the fatigue.     She sleeps well most nights.  Mood and cognition are doing well.  Vit D was 28 last month.     MS history:  In 2015, she lost vision OS over one day and she was diagnosed with optic neuritis.   An MRi was c/w MS.   She was intially placed on Rebif.   She had breakthrough disease, she switched to Tysabri in 2018.   She has tolerated it well and has no exacerbaon or MRi changes.   She converted to JCV Ab positive 04/2019 (0.67) and repeat titer was 0.59 10/24/2019.  However, in September 2021 it was negative at 0.35.   She switched to [facility] April 2022.  Data reviewed: MRI of the brain 03/09/2021 showed T2/flair foci in the left cerebellar hemisphere, middle cerebellar peduncles and in the periventricular, juxtacortical and deep white matter of both hemispheres.  None of the foci enhanced.    MRI brain 12/01/2019 (report only):  No change from 2/24.2020.    MRI cervical spine 12/01/2019 (report only):  Patchy T2 hyperintense signal changed   MRI of the brain 11/12/2015 showed T2/flair hyperintense foci in the left cerebellar hemisphere, middle cerebellar peduncles and in the periventricular, juxtacortical and deep white matter of both hemispheres.  None of the foci enhanced.  MRI of the cervical spine 11/12/2015 showed subtle T2 hyperintense foci within the spinal cord at C2-C3, C4-C5, C7  and T2-T3.  None of these enhance.  MRI of the brain 01/28/2017 was unchanged compared to the 11/12/2015  MRI of the brain 11/30/2019 was unchanged compared to the MRI from 2018.  MRI of the cervical spine 11/30/2019 was unchanged compared to the MRI from 11/12/2015.  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  Antibody titer 10/24/2019 was low positive at 0.59..  Due to the possible need to switch medications, she had hepatitis, TB and HIV labs which did not show any active infections.  She is varicella immune.     REVIEW OF SYSTEMS: Constitutional: No fevers, chills, sweats, or change in appetite Eyes: No visual changes, double vision, eye pain Ear, nose and throat: No hearing loss, ear  pain, nasal congestion, sore throat Cardiovascular: No chest pain, palpitations Respiratory:  No shortness of breath at rest or with exertion.   No wheezes GastrointestinaI: No nausea, vomiting, diarrhea, abdominal pain, fecal incontinence Genitourinary:  No dysuria, urinary retention or frequency.  No nocturia. Musculoskeletal:  No neck pain, back pain Integumentary: No rash, pruritus, skin lesions Neurological: as above Psychiatric: No depression at this time.  No anxiety Endocrine: No palpitations, diaphoresis, change in appetite, change in weigh or increased thirst Hematologic/Lymphatic:  No anemia, purpura, petechiae. Allergic/Immunologic: No itchy/runny eyes, nasal congestion, recent allergic reactions, rashes  ALLERGIES: No Known Allergies  HOME MEDICATIONS:  Current Outpatient Medications:    acetaminophen (TYLENOL) 500 MG tablet, Take 500 mg by mouth every 6 (six) hours as needed for moderate pain., Disp: , Rfl:    albuterol (VENTOLIN HFA) 108 (90 Base) MCG/ACT inhaler, Inhale 2 puffs into the lungs every 6 (six) hours as needed for wheezing or shortness of breath., Disp: 8 g, Rfl: 0   baclofen (LIORESAL) 10 MG tablet, Take 1 tablet (10 mg total) by mouth 3 (three) times daily., Disp: 90 each, Rfl: 3   diclofenac Sodium (VOLTAREN) 1 % GEL, Apply 4 g topically 4 (four) times daily as needed., Disp: , Rfl:    escitalopram (LEXAPRO) 10 MG tablet, Take 10 mg by mouth daily., Disp: , Rfl:    fluticasone (FLONASE) 50 MCG/ACT nasal spray, Place 2 sprays into both nostrils daily., Disp: 16 g, Rfl: 6   KESIMPTA 20 MG/0.4ML SOAJ, Inject 20 mg into the skin See admin instructions. Every 4 weeks, Disp: , Rfl:    Multiple Vitamin (MULTIVITAMIN) tablet, Take 1 tablet by mouth daily., Disp: , Rfl:    ondansetron (ZOFRAN ODT) 4 MG disintegrating tablet, Take 1 tablet (4 mg total) by mouth every 8 (eight) hours as needed for nausea or vomiting., Disp: 30 tablet, Rfl: 1   tiZANidine (ZANAFLEX) 2  MG tablet, TAKE 1 TABLET BY MOUTH AT BEDTIME (Patient taking differently: Take 2 mg by mouth at bedtime as needed for muscle spasms.), Disp: 90 tablet, Rfl: 3   verapamil (CALAN-SR) 120 MG CR tablet, TAKE 1 TABLET(120 MG) BY MOUTH AT BEDTIME, Disp: 30 tablet, Rfl: 5  PAST MEDICAL HISTORY: Past Medical History:  Diagnosis Date   Abnormality of gait 11/05/2015   Multiple sclerosis (HCC) 11/24/2012   Optic neuritis 11/24/2012    PAST SURGICAL HISTORY: Past Surgical History:  Procedure Laterality Date   APPENDECTOMY      FAMILY HISTORY: Family History  Problem Relation Age of Onset   Hypertension Mother    Hypertension Father    Cancer Maternal Grandmother        Breast cancer    SOCIAL HISTORY:  Social History  Socioeconomic History   Marital status: Single    Spouse name: Not on file   Number of children: 0   Years of education: In college   Highest education level: Not on file  Occupational History    Employer: OTHER    Comment: Pt. works at [employer]  Tobacco Use   Smoking status: Never   Smokeless tobacco: Never  Substance and Sexual Activity   Alcohol use: No   Drug use: No   Sexual activity: Not on file  Other Topics Concern   Not on file  Social History Narrative   Lives at home w/ mom.   Patient is right handed.   Rarely drinks caffeine.   Social Determinants of Health   Financial Resource Strain: Not on file  Food Insecurity: Not on file  Transportation Needs: Not on file  Physical Activity: Not on file  Stress: Not on file  Social Connections: Not on file  Intimate Partner Violence: Not on file     PHYSICAL EXAM  Vitals:   05/08/21 1530  BP: 112/71  Pulse: 67  Weight: 136 lb (61.7 kg)  Height: 5\' 3"  (1.6 m)    Body mass index is 24.09 kg/m.   General: The patient is well-developed and well-nourished and in no acute distress  HEENT:  Head is [place]/AT.  Sclera are anicteric.    Skin: Extremities are without rash or   edema.  Neurologic Exam  Mental status: The patient is alert and oriented x 3 at the time of the examination. The patient has apparent normal recent and remote memory, with an apparently normal attention span and concentration ability.   Speech is normal.  Cranial nerves: Extraocular movements are full.  Color vision was symmetric.  Facial strength and sensation was normal.  No dysarthria.  No obvious hearing deficits are noted.  Motor:  Muscle bulk is normal.   Muscle tone is increased in the left leg.  Strength is 4/5 in the left foot and ankle extensors and 5/5 elsewhere.    Sensory: Sensory testing is intact to pinprick, soft touch and vibration sensation in all 4 extremities.  Coordination: Cerebellar testing reveals good finger-nose-finger and reduced heel-to-shin .  Gait and station: Station is normal.  The gait is wide and spastic with a left foot drop.  She is unable to tandem walk.  Romberg negative.    Reflexes: Deep tendon reflexes are symmetric and normal in arms and 3+ at the knees with spread, more on the left.  There is no ankle clonus.        DIAGNOSTIC DATA (LABS, IMAGING, TESTING) - I reviewed patient records, labs, notes, testing and imaging myself where available.  Lab Results  Component Value Date   WBC 5.7 03/10/2021   HGB 12.3 03/10/2021   HCT 33.6 (L) 03/10/2021   MCV 78.3 (L) 03/10/2021   PLT 279 03/10/2021      Component Value Date/Time   NA 136 03/10/2021 0303   NA 141 10/25/2019 1647   K 4.2 03/10/2021 0303   CL 105 03/10/2021 0303   CO2 25 03/10/2021 0303   GLUCOSE 154 (H) 03/10/2021 0303   BUN 12 03/10/2021 0303   BUN 12 10/25/2019 1647   CREATININE 0.67 03/10/2021 0303   CALCIUM 9.0 03/10/2021 0303   PROT 6.5 03/10/2021 0303   PROT 7.1 11/06/2020 0844   ALBUMIN 3.6 03/10/2021 0303   ALBUMIN 4.8 11/06/2020 0844   AST 18 03/10/2021 0303   ALT 14 03/10/2021  0303   ALKPHOS 36 (L) 03/10/2021 0303   BILITOT 0.7 03/10/2021 0303   BILITOT  0.2 11/06/2020 0844   GFRNONAA >60 03/10/2021 0303   GFRAA 133 10/25/2019 1647       ASSESSMENT AND PLAN  Multiple sclerosis (HCC) - Plan: IgG, IgA, IgM, CBC with Differential/Platelet, Ambulatory referral to Physical Therapy  High risk medication use - Plan: IgG, IgA, IgM, CBC with Differential/Platelet  Abnormality of gait - Plan: Ambulatory referral to Physical Therapy  Raynaud's disease without gangrene  Left foot drop  Spasticity   1.   After his shingles vaccine she felt weaker.  She did not have new lesions on the MRI.  It is probable that the spell represented a fluctuation due to low-grade fever or other effect of the vaccine.   2.. Continue Kesimpta we will check lab work. 3.   Cotinue baclofen 10 mg 3 times daily.  She will also take tizanidine as needed 4.   Return in 4-5 months or sooner if there are new or worsening neurologic symptoms.  Grafton Warzecha A. Epimenio Foot, MD, [facility] 05/08/2021, 5:17 PM Certified in Neurology, Clinical Neurophysiology, Sleep Medicine and Neuroimaging  [facility] [address] [place], [place] [address] [phone]

## 2021-05-09 DIAGNOSIS — Z0289 Encounter for other administrative examinations: Secondary | ICD-10-CM

## 2021-05-09 LAB — CBC WITH DIFFERENTIAL/PLATELET
Basophils Absolute: 0.1 10*3/uL (ref 0.0–0.2)
Basos: 2 %
EOS (ABSOLUTE): 0.1 10*3/uL (ref 0.0–0.4)
Eos: 2 %
Hematocrit: 35.2 % (ref 34.0–46.6)
Hemoglobin: 11.8 g/dL (ref 11.1–15.9)
Immature Grans (Abs): 0 10*3/uL (ref 0.0–0.1)
Immature Granulocytes: 0 %
Lymphocytes Absolute: 1.9 10*3/uL (ref 0.7–3.1)
Lymphs: 38 %
MCH: 28 pg (ref 26.6–33.0)
MCHC: 33.5 g/dL (ref 31.5–35.7)
MCV: 83 fL (ref 79–97)
Monocytes Absolute: 0.6 10*3/uL (ref 0.1–0.9)
Monocytes: 13 %
Neutrophils Absolute: 2.3 10*3/uL (ref 1.4–7.0)
Neutrophils: 45 %
Platelets: 317 10*3/uL (ref 150–450)
RBC: 4.22 x10E6/uL (ref 3.77–5.28)
RDW: 14.4 % (ref 11.7–15.4)
WBC: 5.1 10*3/uL (ref 3.4–10.8)

## 2021-05-09 LAB — IGG, IGA, IGM
IgA/Immunoglobulin A, Serum: 259 mg/dL (ref 87–352)
IgG (Immunoglobin G), Serum: 918 mg/dL (ref 586–1602)
IgM (Immunoglobulin M), Srm: 42 mg/dL (ref 26–217)

## 2021-05-13 LAB — HM PAP SMEAR

## 2021-06-03 NOTE — Telephone Encounter (Signed)
Gave completed/signed forms to MR to process for pt.

## 2021-06-13 NOTE — Telephone Encounter (Signed)
Patients [employer] Disability paperwork completed and faxed to [phone]

## 2021-06-17 ENCOUNTER — Telehealth: Payer: Self-pay | Admitting: *Deleted

## 2021-06-17 NOTE — Telephone Encounter (Signed)
I re faxed pt FMLA on 06/17/2021 to [employer] [phone]

## 2021-07-19 ENCOUNTER — Encounter: Payer: Self-pay | Admitting: Neurology

## 2021-07-25 NOTE — Telephone Encounter (Signed)
Pt called states FMLA was denied due to a missing signature on one of the pages. Pt requesting a call back.

## 2021-07-25 NOTE — Telephone Encounter (Signed)
Toni Weaver is working on this. This was handled Monday.

## 2021-07-25 NOTE — Telephone Encounter (Signed)
FMLA form refaxed 07/25/2021 at 8:15

## 2021-08-07 ENCOUNTER — Encounter: Payer: Self-pay | Admitting: Neurology

## 2021-08-12 ENCOUNTER — Encounter: Payer: Self-pay | Admitting: Neurology

## 2021-09-22 ENCOUNTER — Other Ambulatory Visit: Payer: Self-pay | Admitting: Neurology

## 2021-11-04 ENCOUNTER — Telehealth: Payer: Self-pay | Admitting: *Deleted

## 2021-11-04 NOTE — Telephone Encounter (Signed)
Submitted PA Kesimpta on CMM. Key: [id number]. Waiting on determination from [place]. ?

## 2021-11-05 NOTE — Telephone Encounter (Signed)
The request has been approved. The authorization is effective for a maximum of 12 fills from 11/04/2021 to 11/04/2022, as long as the member is enrolled in their current health plan. The request was approved as submitted. This request is approved for 0.56mL (1 pen-injector) per 28 days. Please note that specialty medications must be filled at the [facility] at [phone] or [facility] at [phone]. A written notification letter will follow with additional details. ?

## 2021-11-07 ENCOUNTER — Encounter: Payer: Self-pay | Admitting: Neurology

## 2021-11-07 ENCOUNTER — Telehealth: Payer: Self-pay | Admitting: Neurology

## 2021-11-07 ENCOUNTER — Ambulatory Visit: Payer: Managed Care, Other (non HMO) | Admitting: Neurology

## 2021-11-07 VITALS — BP 110/70 | HR 69 | Ht 62.0 in | Wt 137.0 lb

## 2021-11-07 DIAGNOSIS — R519 Headache, unspecified: Secondary | ICD-10-CM

## 2021-11-07 DIAGNOSIS — G35 Multiple sclerosis: Secondary | ICD-10-CM

## 2021-11-07 DIAGNOSIS — Z79899 Other long term (current) drug therapy: Secondary | ICD-10-CM | POA: Diagnosis not present

## 2021-11-07 DIAGNOSIS — M21372 Foot drop, left foot: Secondary | ICD-10-CM | POA: Diagnosis not present

## 2021-11-07 DIAGNOSIS — I7300 Raynaud's syndrome without gangrene: Secondary | ICD-10-CM

## 2021-11-07 DIAGNOSIS — R252 Cramp and spasm: Secondary | ICD-10-CM

## 2021-11-07 MED ORDER — MELOXICAM 15 MG PO TABS: 15.0000 mg | ORAL_TABLET | Freq: Every day | ORAL | 0 refills | Status: DC

## 2021-11-07 MED ORDER — METHYLPREDNISOLONE 4 MG PO TABS: ORAL_TABLET | ORAL | 0 refills | Status: DC

## 2021-11-07 NOTE — Telephone Encounter (Signed)
Pt states she has already received confirmation that the meloxicam (MOBIC) 15 MG tablet was called in, she is asking if there is an issue with the steroid being called in. ?

## 2021-11-07 NOTE — Progress Notes (Signed)
? ?[facility] ? ?PATIENT: Toni Weaver ?DOB: Apr 02, 1991 ? ?REFERRING DOCTOR OR PCP: PCP is Bernerd Limbo, MD ?SOURCE: Patient, notes from Dr. Jannifer Franklin, imaging and lab reports, MRI images personally reviewed. ? ?_________________________________ ? ? ?HISTORICAL ? ?CHIEF COMPLAINT:  ?Chief Complaint  ?Patient presents with  ? Follow-up  ?  Pt alone, rm 1. MS follow up pt states that she has been having headaches consistently for 2 weeks. Her BP has been stable. She has had headaches before but never to this daily degree. DMT: Kesimpta  ? ? ?HISTORY OF PRESENT ILLNESS:  ?Toni Eckmann is a 31 year-old woman with relapsing remitting multiple sclerosis who is currently on Kesimpta. ? ?Update 11/07/2021: ?She switched to [place] in 2022 (was on [place]).   She is tolerating it well and denies any skin reactions or systemic reactions.  She has no exacerbation but notes balance is mildly worse.    She asked about another medication, Tecfidera, one of her friends is on.    ? ?She is having HA has 1..  They are bifrontal.They occur frequently - every day this week and a few last week.    She gets Nausea and has some photophobia and phonophobia.   Moving mildly increases the pain.  If she sleeps the pain mproves.   NSAIDs help short term.     ? ?She uses a cane due to left leg weakness and reduced balance.   She has a left foot drop.  She uses a bannister on stairs.   She has had a left foot drop since around 2018. Her right leg and both arms/hands feel fine.    She notes some spasticity in her legs that are often painful, especially on the left.   Baclofen helps some   Tizanidine helps bt makes her sleepy so she just takes prn.   She stopped PT because tere was no further progress. ? ?She had a brief hospitalization 2022 after her shingles vaccine and possible exacerbation.  MRI of the brain and cervical spine have not shown any new lesions. ? ?She denies any numbness or tingling.    She has an irregular  bowel pattern with frequebt constipation despite Miralax.   She has had very rare fecal incontinence.   She has rare urinary urge incontinence ad has urgency/frequency.     Vision is doing well ? ?Verapamil was added for Raynaud's syndrome with benefit ? ?She has mild fatigue and works out less because of the fatigue.     She sleeps well most nights.  Mood and cognition are doing well. ? ?Vit D was 28 last month.     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   ? ?MS history:  ?In 2015, she lost vision OS over one day and she was diagnosed with optic neuritis.   An MRi was c/w MS.   She was intially placed on Rebif.   She had breakthrough disease, she switched to Tysabri in 2018.   She has tolerated it well and has no exacerbaon or MRi changes.   She converted to JCV Ab positive 04/2019 (0.67) and repeat titer was 0.59 10/24/2019.  However, in September 2021 it was negative at 0.35.   She switched to [facility] April 2022. ? ?Data reviewed: ?MRI of the brain 03/09/2021 showed T2/flair foci in the left cerebellar hemisphere, middle cerebellar peduncles and in the periventricular, juxtacortical and deep white matter of both hemispheres.  None of the foci enhanced. ? ? ? ?  MRI brain 12/01/2019 (report only):  No change from 2/24.2020.   ? ?MRI cervical spine 12/01/2019 (report only):  Patchy T2 hyperintense signal changed  ? ?MRI of the brain 11/12/2015 showed T2/flair hyperintense foci in the left cerebellar hemisphere, middle cerebellar peduncles and in the periventricular, juxtacortical and deep white matter of both hemispheres.  None of the foci enhanced. ? ?MRI of the cervical spine 11/12/2015 showed subtle T2 hyperintense foci within the spinal cord at C2-C3, C4-C5, C7 and T2-T3.  None of these enhance. ? ?MRI of the brain 01/28/2017 was unchanged compared to the 11/12/2015 ? ?MRI of the brain 11/30/2019 was unchanged compared to the MRI from 2018. ? ?MRI of the cervical spine 11/30/2019 was unchanged compared to the MRI from  11/12/2015. ? ?Lab work showed a low positive JCV antibody 04/26/2019 (0.67).  Antibody titer 10/24/2019 was low positive at 0.59..  Due to the possible need to switch medications, she had hepatitis, TB and HIV labs which did not show any active infections.  She is varicella immune.    ? ?REVIEW OF SYSTEMS: ?Constitutional: No fevers, chills, sweats, or change in appetite ?Eyes: No visual changes, double vision, eye pain ?Ear, nose and throat: No hearing loss, ear pain, nasal congestion, sore throat ?Cardiovascular: No chest pain, palpitations ?Respiratory:  No shortness of breath at rest or with exertion.   No wheezes ?GastrointestinaI: No nausea, vomiting, diarrhea, abdominal pain, fecal incontinence ?Genitourinary:  No dysuria, urinary retention or frequency.  No nocturia. ?Musculoskeletal:  No neck pain, back pain ?Integumentary: No rash, pruritus, skin lesions ?Neurological: as above ?Psychiatric: No depression at this time.  No anxiety ?Endocrine: No palpitations, diaphoresis, change in appetite, change in weigh or increased thirst ?Hematologic/Lymphatic:  No anemia, purpura, petechiae. ?Allergic/Immunologic: No itchy/runny eyes, nasal congestion, recent allergic reactions, rashes ? ?ALLERGIES: ?No Known Allergies ? ?HOME MEDICATIONS: ? ?Current Outpatient Medications:  ?  acetaminophen (TYLENOL) 500 MG tablet, Take 500 mg by mouth every 6 (six) hours as needed for moderate pain., Disp: , Rfl:  ?  albuterol (VENTOLIN HFA) 108 (90 Base) MCG/ACT inhaler, Inhale 2 puffs into the lungs every 6 (six) hours as needed for wheezing or shortness of breath., Disp: 8 g, Rfl: 0 ?  baclofen (LIORESAL) 10 MG tablet, TAKE 1 TABLET(10 MG) BY MOUTH THREE TIMES DAILY, Disp: 90 tablet, Rfl: 1 ?  diclofenac Sodium (VOLTAREN) 1 % GEL, Apply 4 g topically 4 (four) times daily as needed., Disp: , Rfl:  ?  escitalopram (LEXAPRO) 10 MG tablet, Take 10 mg by mouth daily., Disp: , Rfl:  ?  fluticasone (FLONASE) 50 MCG/ACT nasal spray,  Place 2 sprays into both nostrils daily., Disp: 16 g, Rfl: 6 ?  KESIMPTA 20 MG/0.4ML SOAJ, Inject 20 mg into the skin See admin instructions. Every 4 weeks, Disp: , Rfl:  ?  meloxicam (MOBIC) 15 MG tablet, Take 1 tablet (15 mg total) by mouth daily., Disp: 15 tablet, Rfl: 0 ?  methylPREDNISolone (MEDROL) 4 MG tablet, Taper from 6 pills po for one day to 1 pill po the last day over 6 days, Disp: 21 tablet, Rfl: 0 ?  Multiple Vitamin (MULTIVITAMIN) tablet, Take 1 tablet by mouth daily., Disp: , Rfl:  ?  ondansetron (ZOFRAN ODT) 4 MG disintegrating tablet, Take 1 tablet (4 mg total) by mouth every 8 (eight) hours as needed for nausea or vomiting., Disp: 30 tablet, Rfl: 1 ?  tiZANidine (ZANAFLEX) 2 MG tablet, TAKE 1 TABLET BY MOUTH AT BEDTIME (Patient taking differently:  Take 2 mg by mouth at bedtime as needed for muscle spasms.), Disp: 90 tablet, Rfl: 3 ?  verapamil (CALAN-SR) 120 MG CR tablet, TAKE 1 TABLET(120 MG) BY MOUTH AT BEDTIME, Disp: 30 tablet, Rfl: 5 ? ?PAST MEDICAL HISTORY: ?Past Medical History:  ?Diagnosis Date  ? Abnormality of gait 11/05/2015  ? Multiple sclerosis ([place]) 11/24/2012  ? Optic neuritis 11/24/2012  ? ? ?PAST SURGICAL HISTORY: ?Past Surgical History:  ?Procedure Laterality Date  ? APPENDECTOMY    ? ? ?FAMILY HISTORY: ?Family History  ?Problem Relation Age of Onset  ? Hypertension Mother   ? Hypertension Father   ? Cancer Maternal Grandmother   ?     Breast cancer  ? ? ?SOCIAL HISTORY: ? ?Social History  ? ?Socioeconomic History  ? Marital status: Single  ?  Spouse name: Not on file  ? Number of children: 0  ? Years of education: In college  ? Highest education level: Not on file  ?Occupational History  ?  Employer: OTHER  ?  Comment: Pt. works at [employer]  ?Tobacco Use  ? Smoking status: Never  ? Smokeless tobacco: Never  ?Substance and Sexual Activity  ? Alcohol use: No  ? Drug use: No  ? Sexual activity: Not on file  ?Other Topics Concern  ? Not on file  ?Social History  Narrative  ? Lives at home w/ mom.  ? Patient is right handed.  ? Rarely drinks caffeine.  ? ?Social Determinants of Health  ? ?Financial Resource Strain: Not on file  ?Food Insecurity: Not on file  ?Transportation

## 2021-11-07 NOTE — Telephone Encounter (Signed)
Called pt back. Confirmed Dr. Epimenio Foot sent in methylprenisolone 4mg  tablet as well/receipt confirmed by pharmacy. She was only going off app on phone which is telling her only meloxicam ready. Recommended she call pharmacy to confirm. They should have steroid on file as well. ?She verbalized understanding.  ? ?I called [facility], spoke w/ Donte. Confirmed they received both prescriptions sent today. ?

## 2021-11-08 LAB — CBC WITH DIFFERENTIAL/PLATELET
Basophils Absolute: 0.1 10*3/uL (ref 0.0–0.2)
Basos: 2 %
EOS (ABSOLUTE): 0.1 10*3/uL (ref 0.0–0.4)
Eos: 1 %
Hematocrit: 33.6 % — ABNORMAL LOW (ref 34.0–46.6)
Hemoglobin: 11.8 g/dL (ref 11.1–15.9)
Immature Grans (Abs): 0 10*3/uL (ref 0.0–0.1)
Immature Granulocytes: 0 %
Lymphocytes Absolute: 2.1 10*3/uL (ref 0.7–3.1)
Lymphs: 37 %
MCH: 29.1 pg (ref 26.6–33.0)
MCHC: 35.1 g/dL (ref 31.5–35.7)
MCV: 83 fL (ref 79–97)
Monocytes Absolute: 0.5 10*3/uL (ref 0.1–0.9)
Monocytes: 9 %
Neutrophils Absolute: 2.9 10*3/uL (ref 1.4–7.0)
Neutrophils: 51 %
Platelets: 320 10*3/uL (ref 150–450)
RBC: 4.06 x10E6/uL (ref 3.77–5.28)
RDW: 13.1 % (ref 11.7–15.4)
WBC: 5.8 10*3/uL (ref 3.4–10.8)

## 2021-11-08 LAB — COMPREHENSIVE METABOLIC PANEL
ALT: 9 IU/L (ref 0–32)
AST: 14 IU/L (ref 0–40)
Albumin/Globulin Ratio: 2.1 (ref 1.2–2.2)
Albumin: 4.5 g/dL (ref 3.8–4.8)
Alkaline Phosphatase: 40 IU/L — ABNORMAL LOW (ref 44–121)
BUN/Creatinine Ratio: 22 (ref 9–23)
BUN: 15 mg/dL (ref 6–20)
Bilirubin Total: <0.2 mg/dL (ref 0.0–1.2)
CO2: 25 mmol/L (ref 20–29)
Calcium: 9.3 mg/dL (ref 8.7–10.2)
Chloride: 102 mmol/L (ref 96–106)
Creatinine, Ser: 0.67 mg/dL (ref 0.57–1.00)
Globulin, Total: 2.1 g/dL (ref 1.5–4.5)
Glucose: 80 mg/dL (ref 70–99)
Potassium: 4 mmol/L (ref 3.5–5.2)
Sodium: 141 mmol/L (ref 134–144)
Total Protein: 6.6 g/dL (ref 6.0–8.5)
eGFR: 120 mL/min/{1.73_m2} (ref >59)

## 2021-11-08 LAB — IGG, IGA, IGM
IgA/Immunoglobulin A, Serum: 259 mg/dL (ref 87–352)
IgG (Immunoglobin G), Serum: 922 mg/dL (ref 586–1602)
IgM (Immunoglobulin M), Srm: 38 mg/dL (ref 26–217)

## 2021-11-10 ENCOUNTER — Encounter: Payer: Self-pay | Admitting: Neurology

## 2021-11-11 MED ORDER — RIZATRIPTAN BENZOATE 10 MG PO TABS: 10.0000 mg | ORAL_TABLET | ORAL | 5 refills | Status: DC | PRN

## 2021-11-11 MED ORDER — IMIPRAMINE HCL 25 MG PO TABS: 25.0000 mg | ORAL_TABLET | Freq: Every day | ORAL | 5 refills | Status: DC

## 2021-11-18 ENCOUNTER — Other Ambulatory Visit: Payer: Self-pay | Admitting: Neurology

## 2021-11-19 ENCOUNTER — Other Ambulatory Visit: Payer: Self-pay | Admitting: Neurology

## 2021-11-20 ENCOUNTER — Other Ambulatory Visit: Payer: Self-pay | Admitting: Neurology

## 2021-12-09 ENCOUNTER — Other Ambulatory Visit: Payer: Self-pay | Admitting: *Deleted

## 2021-12-09 ENCOUNTER — Encounter: Payer: Self-pay | Admitting: Neurology

## 2021-12-09 MED ORDER — BACLOFEN 10 MG PO TABS: ORAL_TABLET | ORAL | 1 refills | Status: DC

## 2022-01-01 ENCOUNTER — Other Ambulatory Visit: Payer: Self-pay | Admitting: *Deleted

## 2022-01-01 ENCOUNTER — Encounter: Payer: Self-pay | Admitting: Neurology

## 2022-01-01 DIAGNOSIS — G35 Multiple sclerosis: Secondary | ICD-10-CM

## 2022-01-01 MED ORDER — KESIMPTA 20 MG/0.4ML ~~LOC~~ SOAJ: 20.0000 mg | SUBCUTANEOUS | 11 refills | Status: DC

## 2022-01-24 ENCOUNTER — Encounter: Payer: Self-pay | Admitting: Neurology

## 2022-01-27 ENCOUNTER — Encounter: Payer: Self-pay | Admitting: Neurology

## 2022-01-27 ENCOUNTER — Other Ambulatory Visit: Payer: Self-pay | Admitting: Neurology

## 2022-01-27 MED ORDER — ONDANSETRON 4 MG PO TBDP: 4.0000 mg | ORAL_TABLET | Freq: Three times a day (TID) | ORAL | 1 refills | Status: DC | PRN

## 2022-01-27 MED ORDER — BACLOFEN 10 MG PO TABS: ORAL_TABLET | ORAL | 5 refills | Status: DC

## 2022-01-31 ENCOUNTER — Encounter: Payer: Self-pay | Admitting: Neurology

## 2022-02-03 ENCOUNTER — Telehealth: Payer: Self-pay | Admitting: Neurology

## 2022-02-03 NOTE — Telephone Encounter (Signed)
Thank you for the update.   That medication should be fine to take

## 2022-02-03 NOTE — Telephone Encounter (Signed)
Pt states she fell at work today, she states she is not hurt.  Pt states she is unable to return to work until she has an updated accomodation from her doctor.  Pt told by phone rep that she must speak with billing before an appointment can be scheduled.  Pt did ask that the message at least be sent on her behalf.

## 2022-02-03 NOTE — Telephone Encounter (Signed)
Called pt back. She did not fall today. She is okay. She has been having more difficulty at work. Larey Seat going into work one day, jumped up w/o cane d/t needing to go to bathroom and lost balance, fell when she had stuff in her hands. She was sent home today at 2pm by work when she typically leaves at 5pm. 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  She LVM for billing. Scheduled work in appt for this Thursday at 10am with Dr. Epimenio Foot. She will check in at 930am. She will call billing Wednesday to get billing issues straightened out before she comes in.

## 2022-02-06 ENCOUNTER — Ambulatory Visit (INDEPENDENT_AMBULATORY_CARE_PROVIDER_SITE_OTHER): Payer: Managed Care, Other (non HMO) | Admitting: Neurology

## 2022-02-06 ENCOUNTER — Encounter: Payer: Self-pay | Admitting: Neurology

## 2022-02-06 ENCOUNTER — Encounter: Payer: Self-pay | Admitting: *Deleted

## 2022-02-06 VITALS — BP 122/86 | HR 96 | Ht 62.0 in | Wt 140.0 lb

## 2022-02-06 DIAGNOSIS — G35 Multiple sclerosis: Secondary | ICD-10-CM

## 2022-02-06 DIAGNOSIS — M21372 Foot drop, left foot: Secondary | ICD-10-CM

## 2022-02-06 DIAGNOSIS — I7300 Raynaud's syndrome without gangrene: Secondary | ICD-10-CM

## 2022-02-06 DIAGNOSIS — Z79899 Other long term (current) drug therapy: Secondary | ICD-10-CM | POA: Diagnosis not present

## 2022-02-06 DIAGNOSIS — R269 Unspecified abnormalities of gait and mobility: Secondary | ICD-10-CM

## 2022-02-06 DIAGNOSIS — R252 Cramp and spasm: Secondary | ICD-10-CM

## 2022-02-06 NOTE — Progress Notes (Signed)
[facility]  PATIENT: Toni Weaver DOB: 07/04/1991  REFERRING DOCTOR OR PCP: PCP is Tracey Harries, MD SOURCE: Patient, notes from Dr. Anne Hahn, imaging and lab reports, MRI images personally reviewed.  _________________________________   HISTORICAL  CHIEF COMPLAINT:  Chief Complaint  Patient presents with   Follow-up    Rm 2, alone. Pt is needing an updated FMLA. Pt reports having in increase in falls.     HISTORY OF PRESENT ILLNESS:  Toni Nill is a 31 year-old woman with relapsing remitting multiple sclerosis who is currently on Kesimpta.  Update 02/06/2022: She reports having more falls -- usually tripping on a rug or other item.  She has a left foot drop.    A couple falls were at work .  A few she was walking without her cane.   Usually she does ok short distance but has been safer with it.    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    Her manager would like a note for accomodation and restriction.  She switched to Kesimpta in 2022 (was on [place]).   She is tolerating it well and denies any skin reactions or systemic reactions.  She has no exacerbation but notes balance is mildly worse.    She asked about another medication, Tecfidera, one of her friends is on.     Besides the falls, she feels her MS has been stable and she notes no new problems with balance or strength.  The left foot drop is old.  She uses a cane due to left leg weakness and reduced balance.  She uses a bannister on stairs.   She has had a left foot drop since around 2018. Her right leg and both arms/hands feel fine.    She notes some spasticity in her legs that are often painful, especially on the left.   Baclofen helps some   Tizanidine helps bt makes her sleepy so she just takes prn.   She stopped PT because tere was no further progress.  She had a brief hospitalization 2022 after her shingles vaccine and possible exacerbation.  MRI of the brain and cervical spine  have not shown any new lesions.  She denies any numbness or tingling.    She has an irregular bowel pattern with frequebt constipation despite Miralax.   She has had very rare fecal incontinence.   She has rare urinary urge incontinence ad has urgency/frequency.     Vision is doing well  Verapamil was added for Raynaud's syndrome with benefit.   Headaches have done better    She has mild fatigue and works out less because of the fatigue.     She sleeps well most nights.  Cognition are doing well.   She is on bupropion for mood  She works at a desk in a medical office.    She takes Vit D supplements  MS history:  In 2015, she lost vision OS over one day and she was diagnosed with optic neuritis.   An MRi was c/w MS.   She was intially placed on Rebif.   She had breakthrough disease, she switched to Tysabri in 2018.   She has tolerated it well and has no exacerbaon or MRi changes.   She converted to JCV Ab positive 04/2019 (0.67) and repeat titer was 0.59 10/24/2019.  However, in September 2021 it was negative at 0.35.   She switched to [facility] April 2022.  Data reviewed: MRI of the brain 03/09/2021  showed T2/flair foci in the left cerebellar hemisphere, middle cerebellar peduncles and in the periventricular, juxtacortical and deep white matter of both hemispheres.  None of the foci enhanced.    MRI brain 12/01/2019 (report only):  No change from 2/24.2020.    MRI cervical spine 12/01/2019 (report only):  Patchy T2 hyperintense signal changed   MRI of the brain 11/12/2015 showed T2/flair hyperintense foci in the left cerebellar hemisphere, middle cerebellar peduncles and in the periventricular, juxtacortical and deep white matter of both hemispheres.  None of the foci enhanced.  MRI of the cervical spine 11/12/2015 showed subtle T2 hyperintense foci within the spinal cord at C2-C3, C4-C5, C7 and T2-T3.  None of these enhance.  MRI of the brain 01/28/2017 was unchanged compared to the  11/12/2015  MRI of the brain 11/30/2019 was unchanged compared to the MRI from 2018.  MRI of the cervical spine 11/30/2019 was unchanged compared to the MRI from 11/12/2015.  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  Antibody titer 10/24/2019 was low positive at 0.59..  Due to the possible need to switch medications, she had hepatitis, TB and HIV labs which did not show any active infections.  She is varicella immune.     REVIEW OF SYSTEMS: Constitutional: No fevers, chills, sweats, or change in appetite Eyes: No visual changes, double vision, eye pain Ear, nose and throat: No hearing loss, ear pain, nasal congestion, sore throat Cardiovascular: No chest pain, palpitations Respiratory:  No shortness of breath at rest or with exertion.   No wheezes GastrointestinaI: No nausea, vomiting, diarrhea, abdominal pain, fecal incontinence Genitourinary:  No dysuria, urinary retention or frequency.  No nocturia. Musculoskeletal:  No neck pain, back pain Integumentary: No rash, pruritus, skin lesions Neurological: as above Psychiatric: No depression at this time.  No anxiety Endocrine: No palpitations, diaphoresis, change in appetite, change in weigh or increased thirst Hematologic/Lymphatic:  No anemia, purpura, petechiae. Allergic/Immunologic: No itchy/runny eyes, nasal congestion, recent allergic reactions, rashes  ALLERGIES: No Known Allergies  HOME MEDICATIONS:  Current Outpatient Medications:    baclofen (LIORESAL) 10 MG tablet, TAKE 1 TABLET(10 MG) BY MOUTH THREE TIMES DAILY, Disp: 90 tablet, Rfl: 5   buPROPion (WELLBUTRIN XL) 150 MG 24 hr tablet, Take 150 mg by mouth daily., Disp: , Rfl:    diclofenac Sodium (VOLTAREN) 1 % GEL, Apply 4 g topically 4 (four) times daily as needed., Disp: , Rfl:    escitalopram (LEXAPRO) 10 MG tablet, Take 10 mg by mouth daily., Disp: , Rfl:    imipramine (TOFRANIL) 25 MG tablet, Take 1 tablet (25 mg total) by mouth at bedtime., Disp: 30  tablet, Rfl: 5   KESIMPTA 20 MG/0.4ML SOAJ, Inject 20 mg into the skin every 30 (thirty) days. Every 4 weeks, Disp: 0.4 mL, Rfl: 11   Multiple Vitamin (MULTIVITAMIN) tablet, Take 1 tablet by mouth daily., Disp: , Rfl:    ondansetron (ZOFRAN ODT) 4 MG disintegrating tablet, Take 1 tablet (4 mg total) by mouth every 8 (eight) hours as needed for nausea or vomiting., Disp: 30 tablet, Rfl: 1   rizatriptan (MAXALT) 10 MG tablet, Take 1 tablet (10 mg total) by mouth as needed for migraine. May repeat once in 2 hours if needed. No more than 2 tablets in 24 hours., Disp: 10 tablet, Rfl: 5   tiZANidine (ZANAFLEX) 2 MG tablet, Take 1 tablet (2 mg total) by mouth at bedtime as needed for muscle spasms., Disp: 90 tablet, Rfl: 3   verapamil (  CALAN-SR) 120 MG CR tablet, TAKE 1 TABLET(120 MG) BY MOUTH AT BEDTIME, Disp: 30 tablet, Rfl: 5  PAST MEDICAL HISTORY: Past Medical History:  Diagnosis Date   Abnormality of gait 11/05/2015   Multiple sclerosis (HCC) 11/24/2012   Optic neuritis 11/24/2012    PAST SURGICAL HISTORY: Past Surgical History:  Procedure Laterality Date   APPENDECTOMY      FAMILY HISTORY: Family History  Problem Relation Age of Onset   Hypertension Mother    Hypertension Father    Cancer Maternal Grandmother        Breast cancer    SOCIAL HISTORY:  Social History   Socioeconomic History   Marital status: Single    Spouse name: Not on file   Number of children: 0   Years of education: In college   Highest education level: Not on file  Occupational History    Employer: OTHER    Comment: Pt. works at [employer]  Tobacco Use   Smoking status: Never   Smokeless tobacco: Never  Substance and Sexual Activity   Alcohol use: No   Drug use: No   Sexual activity: Not on file  Other Topics Concern   Not on file  Social History Narrative   Lives at home w/ mom.   Patient is right handed.   Rarely drinks caffeine.   Social Determinants of Health   Financial  Resource Strain: Not on file  Food Insecurity: Not on file  Transportation Needs: Not on file  Physical Activity: Not on file  Stress: Not on file  Social Connections: Not on file  Intimate Partner Violence: Not on file     PHYSICAL EXAM  Vitals:   02/06/22 0951  BP: 122/86  Pulse: 96  Weight: 140 lb (63.5 kg)  Height: 5\' 2"  (1.575 m)     Body mass index is 25.61 kg/m.   General: The patient is well-developed and well-nourished and in no acute distress  HEENT:  Head is [place]/AT.  Sclera are anicteric.    Skin: Extremities are without rash or  edema.  Neurologic Exam  Mental status: The patient is alert and oriented x 3 at the time of the examination. The patient has apparent normal recent and remote memory, with an apparently normal attention span and concentration ability.   Speech is normal.  Cranial nerves: Extraocular movements are full.  Color vision was symmetric.  Facial strength and sensation was normal.  No dysarthria.  No obvious hearing deficits are noted.  Motor:  Muscle bulk is normal.   Muscle tone is increased in the left leg.  Strength is 4/5 in the left foot and ankle extensors and 5/5 elsewhere.    Sensory: Sensory testing is intact to pinprick, soft touch and vibration sensation in the arms.  In the legs, vibration sensation was reduced on the left.  Touch and temperature was symmetric.  Coordination: Cerebellar testing reveals good finger-nose-finger and reduced heel-to-shin .  Gait and station: Station is normal.  She has a spastic gait that is mildly wide with a left foot drop.  She is unable to do a tandem gait.  Romberg is negative.   Reflexes: Deep tendon reflexes are symmetric and normal in arms and 3+ at the knees with spread, more on the left.  There is no ankle clonus.        DIAGNOSTIC DATA (LABS, IMAGING, TESTING) - I reviewed patient records, labs, notes, testing and imaging myself where available.  Lab Results  Component Value  Date    WBC 5.8 11/07/2021   HGB 11.8 11/07/2021   HCT 33.6 (L) 11/07/2021   MCV 83 11/07/2021   PLT 320 11/07/2021      Component Value Date/Time   NA 141 11/07/2021 1633   K 4.0 11/07/2021 1633   CL 102 11/07/2021 1633   CO2 25 11/07/2021 1633   GLUCOSE 80 11/07/2021 1633   GLUCOSE 154 (H) 03/10/2021 0303   BUN 15 11/07/2021 1633   CREATININE 0.67 11/07/2021 1633   CALCIUM 9.3 11/07/2021 1633   PROT 6.6 11/07/2021 1633   ALBUMIN 4.5 11/07/2021 1633   AST 14 11/07/2021 1633   ALT 9 11/07/2021 1633   ALKPHOS 40 (L) 11/07/2021 1633   BILITOT <0.2 11/07/2021 1633   GFRNONAA >60 03/10/2021 0303   GFRAA 133 10/25/2019 1647       ASSESSMENT AND PLAN  Multiple sclerosis (HCC)  High risk medication use  Raynaud's disease without gangrene  Left foot drop  Spasticity  Abnormality of gait   1.   Continue Kesimpta we will check lab work. 2.   I will dictate a note in regards to accommodations for work.   3.   Continue baclofen 10 mg 3 times daily.  Ok take tizanidine as needed 4.   Return in 6 months or sooner if there are new or worsening neurologic symptoms.  Obie Silos A. Epimenio Foot, MD, [facility] 02/06/2022, 10:33 AM Certified in Neurology, Clinical Neurophysiology, Sleep Medicine and Neuroimaging  [facility] [address], [address] [place], [place] [address] [phone]

## 2022-02-06 NOTE — Progress Notes (Signed)
     Re:   Toni Weaver     DOB November 23, 1990   To whom it may concern:  [name] is a patient at the multiple Sclerosis Center at [facility] where she is followed for her MS. due to left leg weakness and spasticity, she has a left foot drop and reduced gait.  She has an increased risk of falls.  This risk is reduced when she uses her cane.    Please allow her to have the following accommodations: She should use her cane when she walks around the office. Due to requiring a cane, she should carry no more than 5 pounds in one arm while she uses the cane in the other. No ladders or step stools Patient may also do remote work from home       If you have any questions or concerns, please don't hesitate to call.  Sincerely,   Euleta Belson A. Epimenio Foot, MD, PhD, FAAN Certified in Neurology, Clinical Neurophysiology, Sleep Medicine, Pain Medicine and Neuroimaging Director, Multiple Sclerosis Center at [facility]   Lesly Pontarelli A. Epimenio Foot, MD, PhD, FAAN Certified in Neurology, Clinical Neurophysiology, Sleep Medicine, Pain Medicine and Neuroimaging Director, Multiple Sclerosis Center at [facility]  [facility] [address] [place], [place] [address] [phone]

## 2022-02-10 ENCOUNTER — Encounter: Payer: Self-pay | Admitting: *Deleted

## 2022-02-25 ENCOUNTER — Encounter: Payer: Self-pay | Admitting: Neurology

## 2022-02-27 MED ORDER — BACLOFEN 10 MG PO TABS: 10.0000 mg | ORAL_TABLET | Freq: Three times a day (TID) | ORAL | 1 refills | Status: DC

## 2022-03-05 ENCOUNTER — Encounter: Payer: Self-pay | Admitting: Neurology

## 2022-03-12 DIAGNOSIS — Z0289 Encounter for other administrative examinations: Secondary | ICD-10-CM

## 2022-03-26 ENCOUNTER — Telehealth: Payer: Self-pay | Admitting: *Deleted

## 2022-03-26 NOTE — Telephone Encounter (Signed)
[place] form faxed on 03/26/22

## 2022-03-28 ENCOUNTER — Encounter: Payer: Self-pay | Admitting: Neurology

## 2022-03-31 ENCOUNTER — Other Ambulatory Visit: Payer: Self-pay | Admitting: Neurology

## 2022-03-31 MED ORDER — DALFAMPRIDINE ER 10 MG PO TB12: ORAL_TABLET | ORAL | 11 refills | Status: DC

## 2022-04-01 ENCOUNTER — Other Ambulatory Visit: Payer: Self-pay | Admitting: *Deleted

## 2022-04-03 ENCOUNTER — Telehealth: Payer: Self-pay | Admitting: Neurology

## 2022-04-03 NOTE — Telephone Encounter (Signed)
Clydie Braun @ [facility] has called to report that a PA is needed on dalfampridine 10 MG TB12The Cover My Meds Key is#[id number]

## 2022-04-03 NOTE — Telephone Encounter (Signed)
PA submitted on CMM. Key: [id number]. Waiting on determination from [employer].

## 2022-04-04 NOTE — Telephone Encounter (Signed)
"  The request has been approved. The authorization is effective from 04/03/2022 to 07/02/2022, as long as the member is enrolled in their current health plan. The request was approved as submitted. This request has been approved for 2 tablets per day. This medication must be filled through [facility] or [facility]. Please call [phone] or [phone] for assistance. A written notification letter will follow with additional details."

## 2022-04-10 ENCOUNTER — Encounter: Payer: Self-pay | Admitting: *Deleted

## 2022-05-04 ENCOUNTER — Other Ambulatory Visit: Payer: Self-pay | Admitting: Neurology

## 2022-05-14 ENCOUNTER — Other Ambulatory Visit: Payer: Self-pay | Admitting: Neurology

## 2022-05-29 ENCOUNTER — Ambulatory Visit: Payer: Managed Care, Other (non HMO) | Admitting: Neurology

## 2022-06-01 ENCOUNTER — Encounter: Payer: Self-pay | Admitting: Neurology

## 2022-06-02 MED ORDER — BACLOFEN 10 MG PO TABS: 10.0000 mg | ORAL_TABLET | Freq: Three times a day (TID) | ORAL | 1 refills | Status: DC

## 2022-06-02 NOTE — Telephone Encounter (Signed)
Called [facility] at [phone]. Spoke w/ tech. Advised we last e-scribed rx 02/27/22 #180, 1 refill. Should have 1 refill remaining. She checked and for some reason rx closed out. Will need new rx. I e-scribed. She verbalized understanding.

## 2022-06-16 ENCOUNTER — Encounter: Payer: Self-pay | Admitting: Neurology

## 2022-07-03 ENCOUNTER — Ambulatory Visit (INDEPENDENT_AMBULATORY_CARE_PROVIDER_SITE_OTHER): Payer: Managed Care, Other (non HMO) | Admitting: Neurology

## 2022-07-03 ENCOUNTER — Telehealth: Payer: Self-pay | Admitting: *Deleted

## 2022-07-03 ENCOUNTER — Encounter: Payer: Self-pay | Admitting: Neurology

## 2022-07-03 VITALS — BP 116/80 | HR 99 | Ht 62.0 in | Wt 141.0 lb

## 2022-07-03 DIAGNOSIS — R269 Unspecified abnormalities of gait and mobility: Secondary | ICD-10-CM | POA: Diagnosis not present

## 2022-07-03 DIAGNOSIS — M21372 Foot drop, left foot: Secondary | ICD-10-CM

## 2022-07-03 DIAGNOSIS — Z79899 Other long term (current) drug therapy: Secondary | ICD-10-CM | POA: Diagnosis not present

## 2022-07-03 DIAGNOSIS — G35 Multiple sclerosis: Secondary | ICD-10-CM | POA: Diagnosis not present

## 2022-07-03 DIAGNOSIS — R252 Cramp and spasm: Secondary | ICD-10-CM

## 2022-07-03 NOTE — Telephone Encounter (Signed)
Placed JCV lab in [facility] lock box for routine lab pick up. Results pending. 

## 2022-07-03 NOTE — Progress Notes (Signed)
[facility]  PATIENT: Toni Weaver DOB: September 25, 1990  REFERRING DOCTOR OR PCP: PCP is Tracey Harries, MD SOURCE: Patient, notes from Dr. Anne Hahn, imaging and lab reports, MRI images personally reviewed.  _________________________________   HISTORICAL  CHIEF COMPLAINT:  Chief Complaint  Patient presents with   Follow-up    Pt in 1 alone Pt here for MS f/u  Pt states  wants to discuss her treatment options, progression of her MS and FMLA  paperwork     HISTORY OF PRESENT ILLNESS:  Toni Knapke is a 31 year-old woman with relapsing remitting multiple sclerosis who is currently on Kesimpta.  Update 07/03/2022 She reports having frequent falls -- often  tripping on a rug or other item.  She has a left foot drop and uses a cane. She is noting more left foot/leg spasticity.  She is on baclofen.    She tires out quickly with her walking.     Usually she does ok short distance but has been safer with it.    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    Her manager would like a note for accomodation and restriction.  She switched to Kesimpta in 2022 (was on [place]).   She is tolerating it well and denies any skin reactions or systemic reactions.  She has no exacerbation but notes more progression.    She asked about]other medication and we discussed   Besides the falls, she feels her MS has been stable and she notes no new problems with balance or strength.  The left foot drop is old.  She uses a cane due to left leg weakness and reduced balance.  She uses a bannister on stairs.   She has had a left foot drop since around 2018. Her right leg and both arms/hands feel fine.    She notes some spasticity in her legs that are often painful, especially on the left.   Baclofen helps some   Tizanidine helps bt makes her sleepy so she just takes prn.   She stopped PT because tere was no further progress.  She had a brief hospitalization 2022 after her shingles vaccine  and possible exacerbation.  MRI of the brain and cervical spine have not shown any new lesions.  She denies any numbness or tingling.    She has an irregular bowel pattern with frequebt constipation despite Miralax.   She has had very rare fecal incontinence.   She has rare urinary urge incontinence ad has urgency/frequency.     Vision is doing well  Verapamil was added for Raynaud's syndrome with benefit.   Headaches have done better    She has mild fatigue and works out less because of the fatigue.     She sleeps well most nights.  Cognition are doing well.   She is on bupropion for mood  She works at a desk in a medical office.    She takes Vit D supplements  MS history:  In 2015, she lost vision OS over one day and she was diagnosed with optic neuritis.   An MRi was c/w MS.   She was intially placed on Rebif.   She had breakthrough disease, she switched to Tysabri in 2018.   She has tolerated it well and has no exacerbaon or MRi changes.   She converted to JCV Ab positive 04/2019 (0.67) and repeat titer was 0.59 10/24/2019.  However, in September 2021 it was negative at 0.35.   She switched  to [facility] April 2022 due to concern of PML.  However, due to s.e. she switched back to Tysabri.  Data reviewed: MRI of the cervical and thoracic spine 03/10/2021 showed lesions at T2-T3, T3-T4, T4-T5, T6-T7, T10, T10-T11, and T11-T12 MRI of the brain 03/09/2021 showed T2/flair foci in the left cerebellar hemisphere, middle cerebellar peduncles and in the periventricular, juxtacortical and deep white matter of both hemispheres.  None of the foci enhanced.  MRI brain 12/01/2019 (report only):  No change from 2/24.2020.    MRI cervical spine 12/01/2019 (report only):  Patchy T2 hyperintense signal changed   MRI of the brain 11/12/2015 showed T2/flair hyperintense foci in the left cerebellar hemisphere, middle cerebellar peduncles and in the periventricular, juxtacortical and deep white matter of both  hemispheres.  None of the foci enhanced.  MRI of the cervical spine 11/12/2015 showed subtle T2 hyperintense foci within the spinal cord at C2-C3, C4-C5, C7 and T2-T3.  None of these enhance.  MRI of the brain 01/28/2017 was unchanged compared to the 11/12/2015  MRI of the brain 11/30/2019 was unchanged compared to the MRI from 2018.  MRI of the cervical spine 11/30/2019 was unchanged compared to the MRI from 11/12/2015.  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  Antibody titer 10/24/2019 was low positive at 0.59..  Due to the possible need to switch medications, she had hepatitis, TB and HIV labs which did not show any active infections.  She is varicella immune.     REVIEW OF SYSTEMS: Constitutional: No fevers, chills, sweats, or change in appetite Eyes: No visual changes, double vision, eye pain Ear, nose and throat: No hearing loss, ear pain, nasal congestion, sore throat Cardiovascular: No chest pain, palpitations Respiratory:  No shortness of breath at rest or with exertion.   No wheezes GastrointestinaI: No nausea, vomiting, diarrhea, abdominal pain, fecal incontinence Genitourinary:  No dysuria, urinary retention or frequency.  No nocturia. Musculoskeletal:  No neck pain, back pain Integumentary: No rash, pruritus, skin lesions Neurological: as above Psychiatric: No depression at this time.  No anxiety Endocrine: No palpitations, diaphoresis, change in appetite, change in weigh or increased thirst Hematologic/Lymphatic:  No anemia, purpura, petechiae. Allergic/Immunologic: No itchy/runny eyes, nasal congestion, recent allergic reactions, rashes  ALLERGIES: No Known Allergies  HOME MEDICATIONS:  Current Outpatient Medications:    baclofen (LIORESAL) 10 MG tablet, Take 1-2 tablets (10-20 mg total) by mouth in the morning, at noon, and at bedtime., Disp: 180 tablet, Rfl: 1   buPROPion (WELLBUTRIN XL) 150 MG 24 hr tablet, Take 150 mg by mouth daily., Disp: , Rfl:     diclofenac Sodium (VOLTAREN) 1 % GEL, Apply 4 g topically 4 (four) times daily as needed., Disp: , Rfl:    escitalopram (LEXAPRO) 10 MG tablet, Take 10 mg by mouth daily., Disp: , Rfl:    imipramine (TOFRANIL) 25 MG tablet, TAKE 1 TABLET(25 MG) BY MOUTH AT BEDTIME, Disp: 30 tablet, Rfl: 5   KESIMPTA 20 MG/0.4ML SOAJ, Inject 20 mg into the skin every 30 (thirty) days. Every 4 weeks, Disp: 0.4 mL, Rfl: 11   Multiple Vitamin (MULTIVITAMIN) tablet, Take 1 tablet by mouth daily., Disp: , Rfl:    ondansetron (ZOFRAN ODT) 4 MG disintegrating tablet, Take 1 tablet (4 mg total) by mouth every 8 (eight) hours as needed for nausea or vomiting., Disp: 30 tablet, Rfl: 1   rizatriptan (MAXALT) 10 MG tablet, Take 1 tablet (10 mg total) by mouth as needed for migraine. May repeat  once in 2 hours if needed. No more than 2 tablets in 24 hours., Disp: 10 tablet, Rfl: 5   tiZANidine (ZANAFLEX) 2 MG tablet, Take 1 tablet (2 mg total) by mouth at bedtime as needed for muscle spasms., Disp: 90 tablet, Rfl: 3   verapamil (CALAN-SR) 120 MG CR tablet, TAKE 1 TABLET(120 MG) BY MOUTH AT BEDTIME, Disp: 30 tablet, Rfl: 5   dalfampridine 10 MG TB12, One po q12 hours (Patient not taking: Reported on 07/03/2022), Disp: 60 tablet, Rfl: 11  PAST MEDICAL HISTORY: Past Medical History:  Diagnosis Date   Abnormality of gait 11/05/2015   Multiple sclerosis ([facility]) 11/24/2012   Optic neuritis 11/24/2012    PAST SURGICAL HISTORY: Past Surgical History:  Procedure Laterality Date   APPENDECTOMY      FAMILY HISTORY: Family History  Problem Relation Age of Onset   Hypertension Mother    Hypertension Father    Multiple sclerosis Paternal Aunt    Cancer Maternal Grandmother        Breast cancer    SOCIAL HISTORY:  Social History   Socioeconomic History   Marital status: Single    Spouse name: Not on file   Number of children: 0   Years of education: In college   Highest education level: Not on file  Occupational  History    Employer: OTHER    Comment: Pt. works at [employer] Use   Smoking status: Never   Smokeless tobacco: Never  Substance and Sexual Activity   Alcohol use: No   Drug use: No   Sexual activity: Not on file  Other Topics Concern   Not on file  Social History Narrative   Lives at home w/ mom.   Patient is right handed.   Rarely drinks caffeine.   Social Determinants of Health   Financial Resource Strain: Not on file  Food Insecurity: Not on file  Transportation Needs: Not on file  Physical Activity: Not on file  Stress: Not on file  Social Connections: Not on file  Intimate Partner Violence: Not on file     PHYSICAL EXAM  Vitals:   07/03/22 1602  BP: 116/80  Pulse: 99  Weight: 141 lb (64 kg)  Height: 5\' 2"  (1.575 m)     Body mass index is 25.79 kg/m.   General: The patient is well-developed and well-nourished and in no acute distress  HEENT:  Head is [place]/AT.  Sclera are anicteric.    Skin: Extremities are without rash or  edema.  Neurologic Exam  Mental status: The patient is alert and oriented x 3 at the time of the examination. The patient has apparent normal recent and remote memory, with an apparently normal attention span and concentration ability.   Speech is normal.  Cranial nerves: Extraocular movements are full.  Color vision was symmetric.  Facial strength and sensation was normal.  No dysarthria.  No obvious hearing deficits are noted.  Motor:  Muscle bulk is normal.   Muscle tone is increased in the left leg.  Strength is 4/5 in the left foot and ankle extensors and 5/5 elsewhere.    Sensory: Sensory testing is intact to pinprick, soft touch and vibration sensation in the arms.  In the legs, vibration sensation was reduced on the left.  Touch and temperature was symmetric.  Coordination: Cerebellar testing reveals good finger-nose-finger and reduced heel-to-shin .  Gait and station: Station is normal.  She has a  spastic gait.  There is a  left foot drop.  She cannot do tandem gait.  Romberg is negative.    Reflexes: Deep tendon reflexes are symmetric and normal in arms and 3+ at the knees with spread, more on the left.  There is no ankle clonus.Marland Kitchen        DIAGNOSTIC DATA (LABS, IMAGING, TESTING) - I reviewed patient records, labs, notes, testing and imaging myself where available.  Lab Results  Component Value Date   WBC 5.8 11/07/2021   HGB 11.8 11/07/2021   HCT 33.6 (L) 11/07/2021   MCV 83 11/07/2021   PLT 320 11/07/2021      Component Value Date/Time   NA 141 11/07/2021 1633   K 4.0 11/07/2021 1633   CL 102 11/07/2021 1633   CO2 25 11/07/2021 1633   GLUCOSE 80 11/07/2021 1633   GLUCOSE 154 (H) 03/10/2021 0303   BUN 15 11/07/2021 1633   CREATININE 0.67 11/07/2021 1633   CALCIUM 9.3 11/07/2021 1633   PROT 6.6 11/07/2021 1633   ALBUMIN 4.5 11/07/2021 1633   AST 14 11/07/2021 1633   ALT 9 11/07/2021 1633   ALKPHOS 40 (L) 11/07/2021 1633   BILITOT <0.2 11/07/2021 1633   GFRNONAA >60 03/10/2021 0303   GFRAA 133 10/25/2019 1647       ASSESSMENT AND PLAN  Multiple sclerosis (HCC) - Plan: MR BRAIN W WO CONTRAST, MR CERVICAL SPINE W WO CONTRAST, Stratify JCV Ab (w/ Index) w/ Rflx, CBC with Differential/Platelet, IgG, IgA, IgM  Abnormality of gait - Plan: MR BRAIN W WO CONTRAST, MR CERVICAL SPINE W WO CONTRAST, Stratify JCV Ab (w/ Index) w/ Rflx, CBC with Differential/Platelet  High risk medication use - Plan: IgG, IgA, IgM  Left foot drop  Spasticity   1.   She has not tolerated Kesimpta as well as she did Tysabri and we discussed the risks and benefits of going back to [place].  She had a low positive JCV antibody reading but her last JCV antibody was actually negative.  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  She would like to get back on Tysabri.  We will recheck the JCV antibody and switch if she is either negative or low positive but consider  staying on Kesimpta or switch to a different medicine if she is JCV moderate or high.  We also need to check MRI of the brain and cervical spine to determine if there is subclinical progression. 2.  FMLA paperwork was filled out.  She has occasional days (averaging 1 a month) where she has much more fatigue or the spasticity is worse requiring time off.  Additionally she will need time off for Tysabri infusions  3.   Continue baclofen 20 mg 3 times daily.  Ok take tizanidine as needed (does not take it often as it makes her sleepy) 4.   We discussed that an AFO brace may help the left foot drop  5.  return in 6 months or sooner if there are new or worsening neurologic symptoms.  48-minute office visit with the majority of the time spent face-to-face for history and physical, discussion/counseling and decision-making.  Additional time with record review and documentation.  Meggan Dhaliwal A. Epimenio Foot, MD, [facility] 07/03/2022, 5:12 PM Certified in Neurology, Clinical Neurophysiology, Sleep Medicine and Neuroimaging  [facility] [address], [address] [place], [place] [address] [phone]

## 2022-07-04 LAB — CBC WITH DIFFERENTIAL/PLATELET
Basophils Absolute: 0.1 10*3/uL (ref 0.0–0.2)
Basos: 1 %
EOS (ABSOLUTE): 0.2 10*3/uL (ref 0.0–0.4)
Eos: 2 %
Hematocrit: 38 % (ref 34.0–46.6)
Hemoglobin: 12.5 g/dL (ref 11.1–15.9)
Immature Grans (Abs): 0 10*3/uL (ref 0.0–0.1)
Immature Granulocytes: 0 %
Lymphocytes Absolute: 1.7 10*3/uL (ref 0.7–3.1)
Lymphs: 26 %
MCH: 28 pg (ref 26.6–33.0)
MCHC: 32.9 g/dL (ref 31.5–35.7)
MCV: 85 fL (ref 79–97)
Monocytes Absolute: 0.6 10*3/uL (ref 0.1–0.9)
Monocytes: 9 %
Neutrophils Absolute: 4.1 10*3/uL (ref 1.4–7.0)
Neutrophils: 62 %
Platelets: 382 10*3/uL (ref 150–450)
RBC: 4.46 x10E6/uL (ref 3.77–5.28)
RDW: 13.7 % (ref 11.7–15.4)
WBC: 6.6 10*3/uL (ref 3.4–10.8)

## 2022-07-08 ENCOUNTER — Encounter: Payer: Self-pay | Admitting: Neurology

## 2022-07-08 ENCOUNTER — Telehealth: Payer: Self-pay | Admitting: Neurology

## 2022-07-08 NOTE — Telephone Encounter (Signed)
Gave addended form to Debra/medical records to resend for pt.

## 2022-07-08 NOTE — Telephone Encounter (Signed)
MR brain w/wo & MR cervical spine w/wo sent to [facility] for scheduling.  No auth needed per [employer].

## 2022-07-09 ENCOUNTER — Encounter: Payer: Self-pay | Admitting: *Deleted

## 2022-07-10 ENCOUNTER — Encounter: Payer: Self-pay | Admitting: Neurology

## 2022-07-10 NOTE — Telephone Encounter (Signed)
Called [facility] at [phone] and spoke with [facility]. Account# [id number].  Confirmed lab received 07/03/22. Pending. Can take up to 2 wk to come back.

## 2022-07-14 NOTE — Telephone Encounter (Signed)
Gave addended FMLA back to medical records to turn in for pt.

## 2022-07-15 NOTE — Telephone Encounter (Signed)
Forms faxed to [facility] (fax# [phone])

## 2022-07-16 ENCOUNTER — Ambulatory Visit: Payer: Managed Care, Other (non HMO) | Admitting: Neurology

## 2022-07-16 ENCOUNTER — Encounter: Payer: Self-pay | Admitting: Neurology

## 2022-07-16 ENCOUNTER — Encounter: Payer: Self-pay | Admitting: *Deleted

## 2022-07-16 VITALS — BP 120/72 | HR 110 | Ht 62.0 in | Wt 144.0 lb

## 2022-07-16 DIAGNOSIS — G35 Multiple sclerosis: Secondary | ICD-10-CM | POA: Diagnosis not present

## 2022-07-16 DIAGNOSIS — R252 Cramp and spasm: Secondary | ICD-10-CM

## 2022-07-16 DIAGNOSIS — Z79899 Other long term (current) drug therapy: Secondary | ICD-10-CM

## 2022-07-16 DIAGNOSIS — R269 Unspecified abnormalities of gait and mobility: Secondary | ICD-10-CM

## 2022-07-16 DIAGNOSIS — M21372 Foot drop, left foot: Secondary | ICD-10-CM

## 2022-07-16 NOTE — Progress Notes (Addendum)
[facility]  PATIENT: Toni Weaver DOB: 1990/09/22  REFERRING DOCTOR OR PCP: PCP is Bernerd Limbo, MD SOURCE: Patient, notes from Dr. Jannifer Franklin, imaging and lab reports, MRI images personally reviewed.  _________________________________   HISTORICAL  CHIEF COMPLAINT:  Chief Complaint  Patient presents with   Follow-up    Pt in room #11 and alone. Pt here today to discuss FMLA/leave.    HISTORY OF PRESENT ILLNESS:  Toni Weaver is a 31 year-old woman with relapsing remitting multiple sclerosis who is currently on Kesimpta.  Update 07/16/2022 She reports more issues at work due to her impairments of gait and fatigue.     She currently works at a [employer], [employer] in a medical office.  The requirement for frequent standing up has been difficult.    She had tried to go remote.  Due to her physical impairments and her fatigue a remote position would allow her to work more effectively.     We have supplied FMLA with a leave in December and intermittent time off for MS symptoms (1-2 day a month).    I have recommended remote work or at least a position where she can remain seated.  Due to left leg spasticity and right > left weakness, gait is impaired   She reports having frequent falls -- often  tripping on a rug or other item.  Some falls have occurred at work.   She has a left foot drop and uses a cane. She is noting more left foot/leg spasticity.  She is on baclofen.    She tires out quickly with her walking.     Although she can walk sort distances without the cane, she is safer and faster with the cane.    She has had a left foot drop since around 2018. Her right leg and both arms/hands feel fine.    She notes some spasticity in her legs that are often painful, especially on the left.   Baclofen helps some   Tizanidine helps but makes her sleepy so she just takes prn.   She has done PT  She denies any numbness or tingling.    She has an irregular bowel pattern with frequebt  constipation despite Miralax.   She has had very rare fecal incontinence.   She has rare urinary urge incontinence ad has urgency/frequency.     Vision is doing well  She has fatigue and works out less because of the fatigue.     She sleeps well most nights.  She reports some cognitive issues and has made some errors at work.   She is on Lexapro and bupropion for mood  She switched to [place] in 2022 (was on Tysabri).   She is tolerating it well and denies any skin reactions or systemic reactions.  She has no exacerbation but notes more progression.   We have discussed going back on Tysabri if JCV Ab negative or low positive (lab is pending).    She had a brief hospitalization 2022 after her shingles vaccine and possible exacerbation.  MRI of the brain and cervical spine have not shown any new lesions.  Verapamil was added for Raynaud's syndrome with benefit.   Headaches have done better    She takes Vit D supplements  MS history:  In 2015, she lost vision OS over one day and she was diagnosed with optic neuritis.   An MRi was c/w MS.   She was intially placed on Rebif.   She had  breakthrough disease, she switched to Tysabri in 2018.   She has tolerated it well and has no exacerbaon or MRi changes.   She converted to JCV Ab positive 04/2019 (0.67) and repeat titer was 0.59 10/24/2019.  However, in September 2021 it was negative at 0.35.   She switched to [facility] April 2022 due to concern of PML.  However, due to s.e. she switched back to Tysabri.  Data reviewed: MRI of the cervical and thoracic spine 03/10/2021 showed lesions at T2-T3, T3-T4, T4-T5, T6-T7, T10, T10-T11, and T11-T12 MRI of the brain 03/09/2021 showed T2/flair foci in the left cerebellar hemisphere, middle cerebellar peduncles and in the periventricular, juxtacortical and deep white matter of both hemispheres.  None of the foci enhanced.  MRI brain 12/01/2019 (report only):  No change from 2/24.2020.    MRI cervical spine 12/01/2019  (report only):  Patchy T2 hyperintense signal changed   MRI of the brain 11/12/2015 showed T2/flair hyperintense foci in the left cerebellar hemisphere, middle cerebellar peduncles and in the periventricular, juxtacortical and deep white matter of both hemispheres.  None of the foci enhanced.  MRI of the cervical spine 11/12/2015 showed subtle T2 hyperintense foci within the spinal cord at C2-C3, C4-C5, C7 and T2-T3.  None of these enhance.  MRI of the brain 01/28/2017 was unchanged compared to the 11/12/2015  MRI of the brain 11/30/2019 was unchanged compared to the MRI from 2018.  MRI of the cervical spine 11/30/2019 was unchanged compared to the MRI from 11/12/2015.  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  Antibody titer 10/24/2019 was low positive at 0.59..  Due to the possible need to switch medications, she had hepatitis, TB and HIV labs which did not show any active infections.  She is varicella immune.     REVIEW OF SYSTEMS: Constitutional: No fevers, chills, sweats, or change in appetite Eyes: No visual changes, double vision, eye pain Ear, nose and throat: No hearing loss, ear pain, nasal congestion, sore throat Cardiovascular: No chest pain, palpitations Respiratory:  No shortness of breath at rest or with exertion.   No wheezes GastrointestinaI: No nausea, vomiting, diarrhea, abdominal pain, fecal incontinence Genitourinary:  No dysuria, urinary retention or frequency.  No nocturia. Musculoskeletal:  No neck pain, back pain Integumentary: No rash, pruritus, skin lesions Neurological: as above Psychiatric: No depression at this time.  No anxiety Endocrine: No palpitations, diaphoresis, change in appetite, change in weigh or increased thirst Hematologic/Lymphatic:  No anemia, purpura, petechiae. Allergic/Immunologic: No itchy/runny eyes, nasal congestion, recent allergic reactions, rashes  ALLERGIES: No Known Allergies  HOME MEDICATIONS:  Current Outpatient  Medications:    baclofen (LIORESAL) 10 MG tablet, Take 1-2 tablets (10-20 mg total) by mouth in the morning, at noon, and at bedtime., Disp: 180 tablet, Rfl: 1   buPROPion (WELLBUTRIN XL) 150 MG 24 hr tablet, Take 150 mg by mouth daily., Disp: , Rfl:    diclofenac Sodium (VOLTAREN) 1 % GEL, Apply 4 g topically 4 (four) times daily as needed., Disp: , Rfl:    escitalopram (LEXAPRO) 10 MG tablet, Take 10 mg by mouth daily., Disp: , Rfl:    imipramine (TOFRANIL) 25 MG tablet, TAKE 1 TABLET(25 MG) BY MOUTH AT BEDTIME, Disp: 30 tablet, Rfl: 5   KESIMPTA 20 MG/0.4ML SOAJ, Inject 20 mg into the skin every 30 (thirty) days. Every 4 weeks, Disp: 0.4 mL, Rfl: 11   Multiple Vitamin (MULTIVITAMIN) tablet, Take 1 tablet by mouth daily., Disp: , Rfl:  ondansetron (ZOFRAN ODT) 4 MG disintegrating tablet, Take 1 tablet (4 mg total) by mouth every 8 (eight) hours as needed for nausea or vomiting., Disp: 30 tablet, Rfl: 1   rizatriptan (MAXALT) 10 MG tablet, Take 1 tablet (10 mg total) by mouth as needed for migraine. May repeat once in 2 hours if needed. No more than 2 tablets in 24 hours., Disp: 10 tablet, Rfl: 5   tiZANidine (ZANAFLEX) 2 MG tablet, Take 1 tablet (2 mg total) by mouth at bedtime as needed for muscle spasms., Disp: 90 tablet, Rfl: 3   verapamil (CALAN-SR) 120 MG CR tablet, TAKE 1 TABLET(120 MG) BY MOUTH AT BEDTIME, Disp: 30 tablet, Rfl: 5   dalfampridine 10 MG TB12, One po q12 hours (Patient not taking: Reported on 07/03/2022), Disp: 60 tablet, Rfl: 11  PAST MEDICAL HISTORY: Past Medical History:  Diagnosis Date   Abnormality of gait 11/05/2015   Multiple sclerosis (HCC) 11/24/2012   Optic neuritis 11/24/2012    PAST SURGICAL HISTORY: Past Surgical History:  Procedure Laterality Date   APPENDECTOMY      FAMILY HISTORY: Family History  Problem Relation Age of Onset   Hypertension Mother    Hypertension Father    Multiple sclerosis Paternal Aunt    Cancer Maternal Grandmother         Breast cancer    SOCIAL HISTORY:  Social History   Socioeconomic History   Marital status: Single    Spouse name: Not on file   Number of children: 0   Years of education: In college   Highest education level: Not on file  Occupational History    Employer: OTHER    Comment: Pt. works at [employer]  Tobacco Use   Smoking status: Never   Smokeless tobacco: Never  Substance and Sexual Activity   Alcohol use: No   Drug use: No   Sexual activity: Not on file  Other Topics Concern   Not on file  Social History Narrative   Lives at home w/ mom.   Patient is right handed.   Rarely drinks caffeine.   Social Determinants of Health   Financial Resource Strain: Not on file  Food Insecurity: Not on file  Transportation Needs: Not on file  Physical Activity: Not on file  Stress: Not on file  Social Connections: Not on file  Intimate Partner Violence: Not on file     PHYSICAL EXAM  Vitals:   07/16/22 0852  BP: 120/72  Pulse: (!) 110  Weight: 144 lb (65.3 kg)  Height: 5\' 2"  (1.575 m)     Body mass index is 26.34 kg/m.   General: The patient is well-developed and well-nourished and in no acute distress  HEENT:  Head is [place]/AT.  Sclera are anicteric.    Skin: Extremities are without rash or  edema.  Neurologic Exam  Mental status: The patient is alert and oriented x 3 at the time of the examination. The patient has apparent normal recent and remote memory, with an apparently normal attention span and concentration ability.   Speech is normal.  Cranial nerves: Extraocular movements are full.  Color vision was symmetric.  Facial strength and sensation was normal.  No dysarthria.  No obvious hearing deficits are noted.  Motor:  Muscle bulk is normal.   Muscle tone is increased in the left leg.  Strength is 4/5 in the left foot and ankle extensors and 5/5 elsewhere.    Sensory: Sensory testing is intact to pinprick, soft touch  and vibration sensation in  the arms.  In the legs, vibration sensation was reduced on the left.  Touch and temperature was symmetric.  Coordination: Cerebellar testing reveals good finger-nose-finger and reduced heel-to-shin .  Gait and station: Station is normal.  The gait is spastic with a left foot drop.  She can take some steps without a cane but does better with it.  She cannot do tandem gait.  Romberg is negative.    Reflexes: Deep tendon reflexes are symmetric and normal in arms and 3+ at the knees with spread, more on the left.  There is no ankle clonus.Marland Kitchen        DIAGNOSTIC DATA (LABS, IMAGING, TESTING) - I reviewed patient records, labs, notes, testing and imaging myself where available.  Lab Results  Component Value Date   WBC 6.6 07/03/2022   HGB 12.5 07/03/2022   HCT 38.0 07/03/2022   MCV 85 07/03/2022   PLT 382 07/03/2022      Component Value Date/Time   NA 141 11/07/2021 1633   K 4.0 11/07/2021 1633   CL 102 11/07/2021 1633   CO2 25 11/07/2021 1633   GLUCOSE 80 11/07/2021 1633   GLUCOSE 154 (H) 03/10/2021 0303   BUN 15 11/07/2021 1633   CREATININE 0.67 11/07/2021 1633   CALCIUM 9.3 11/07/2021 1633   PROT 6.6 11/07/2021 1633   ALBUMIN 4.5 11/07/2021 1633   AST 14 11/07/2021 1633   ALT 9 11/07/2021 1633   ALKPHOS 40 (L) 11/07/2021 1633   BILITOT <0.2 11/07/2021 1633   GFRNONAA >60 03/10/2021 0303   GFRAA 133 10/25/2019 1647       ASSESSMENT AND PLAN  Multiple sclerosis (HCC)  Abnormality of gait  High risk medication use  Spasticity  Left foot drop   1.   She has felt more stable on Tysabri than Kesimpta.  If she is JCV antibody negative or low positive we will consider switching her back.  Hopefully will have those results soon.  Additionally we will check MRI of the brain and cervical spine to determine if there has been subclinical progression at that could also prompt a change in therapy.   2.  FMLA paperwork was revised to include a medical leave during the month of  December.  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  She would do best with a remote job or at least a job where she may remain seated.  She also needs intermittent leave for 1 to 2 days a month due to more MS symptoms.  Additionally she will need time off for Tysabri infusions  3.   Continue baclofen 20 mg 3 times daily.  Ok to take tizanidine as needed (does not take it often as it makes her sleepy) 4.  Consider AFO brace may help the left foot drop  5.  return in 6 months or sooner if there are new or worsening neurologic symptoms.  42-minute office visit with the majority of the time spent face-to-face for history and physical, discussion/counseling and decision-making.  Additional time with record review and documentation.  Izetta Sakamoto A. Felecia Shelling, MD, [facility] [id number], [id number] AM Certified in Neurology, Clinical Neurophysiology, Sleep Medicine and Neuroimaging  [facility] [address], [place], [place] [address] [phone]

## 2022-07-17 NOTE — Telephone Encounter (Signed)
Called [facility] to check on status of result. Spoke w/ Marcelino Duster. Showing test had to be sent for inhibition assay, this is still pending. She has sent email to get update on test.

## 2022-07-18 LAB — STRATIFY JCV AB (W/ INDEX) W/ RFLX
Index Value: 0.4 — ABNORMAL HIGH
Stratify JCV (TM) Ab w/Reflex Inhibition: UNDETERMINED — AB

## 2022-07-18 LAB — RFLX STRATIFY JCV (TM) AB INHIBITION: JCV Antibody by Inhibition: NEGATIVE

## 2022-07-21 ENCOUNTER — Telehealth: Payer: Self-pay | Admitting: *Deleted

## 2022-07-21 NOTE — Telephone Encounter (Signed)
JCV ab drawn 07/03/22 indeterminate, index: 0.40. Inhibition assay: negative. Gave to MD for review.

## 2022-07-21 NOTE — Telephone Encounter (Signed)
Faxed complete/signed Tysabri start form to [facility] prescribing program at [phone]. Received fax confirmation. Gave completed start form to [facility] for them to process. Included office visit notes,signed order for [facility], labs, MRI. Sent copy of start form to be scanned to epic.   

## 2022-07-30 ENCOUNTER — Encounter: Payer: Self-pay | Admitting: Neurology

## 2022-08-06 ENCOUNTER — Encounter: Payer: Self-pay | Admitting: Neurology

## 2022-08-06 NOTE — Telephone Encounter (Signed)
We received a call from Ms Auman regarding her work note request. I advised her that Dr Felecia Shelling is out of the office until next Monday, but if needed I could see if an on call provider can take a look at the issue. She was requesting a response by today.  Can you please advise?

## 2022-08-17 ENCOUNTER — Encounter: Payer: Self-pay | Admitting: Neurology

## 2022-08-18 MED ORDER — BACLOFEN 10 MG PO TABS: 10.0000 mg | ORAL_TABLET | Freq: Three times a day (TID) | ORAL | 1 refills | Status: DC

## 2022-08-18 NOTE — Telephone Encounter (Signed)
Called [facility] at [phone]. Spoke w/ tech, [place] only rx on file should be:

## 2022-09-01 DIAGNOSIS — Z0289 Encounter for other administrative examinations: Secondary | ICD-10-CM

## 2022-09-04 ENCOUNTER — Telehealth: Payer: Self-pay | Admitting: *Deleted

## 2022-09-04 NOTE — Telephone Encounter (Signed)
Pt [place] and ADA assessment faxed and email today. Copy in my chart for pt.

## 2022-09-08 NOTE — Telephone Encounter (Signed)
Gave addended forms back to medical records to resend for pt.

## 2022-09-09 NOTE — Telephone Encounter (Signed)
Sent form via MyChart.

## 2022-09-21 ENCOUNTER — Other Ambulatory Visit: Payer: Self-pay | Admitting: Neurology

## 2022-09-22 NOTE — Telephone Encounter (Signed)
Last seen on 07/16/2022 Follow up scheduled on 01/08/23

## 2022-09-28 ENCOUNTER — Telehealth: Payer: Self-pay | Admitting: Neurology

## 2022-09-28 ENCOUNTER — Encounter: Payer: Self-pay | Admitting: Neurology

## 2022-09-28 NOTE — Telephone Encounter (Signed)
I personally reviewed the MRI of the brain and cervical spine from 09/23/2022.  The MRI of the brain 09/23/2022 shows multiple T2/FLAIR hyperintense foci in the periventricular, juxtacortical and deep white matter of the cerebral hemispheres.  Foci also noted in the left cerebellar hemisphere and subtly in the right cerebellar hemisphere.  There is normal enhancement pattern.  No change compared to the MRI from 03/10/2021.   MRI of the cervical spine 09/23/2022 shows multiple T2 hyperintense foci within the spinal cord.  These are located towards the right adjacent to C2 and C3, to the left adjacent to C2-C3, posteriorly adjacent to C3-C4, to the left adjacent to C5-C6, to the right adjacent to C7.  Additionally, on the sagittal images there are foci in the thoracic spine adjacent to T2-T3 and T3-T4.  There is normal enhancement pattern.  No degenerative changes noted in the spine.  No definite change compared to the MRI from 03/10/2021.

## 2022-10-23 ENCOUNTER — Other Ambulatory Visit: Payer: Self-pay | Admitting: *Deleted

## 2022-10-23 ENCOUNTER — Telehealth: Payer: Self-pay | Admitting: *Deleted

## 2022-10-23 NOTE — Telephone Encounter (Signed)
Received fax from covermymeds that PA needed for Kesimpta. I submitted PA on covermymeds. Key: [id number]. Waiting on determination from [place].

## 2022-10-26 ENCOUNTER — Other Ambulatory Visit: Payer: Self-pay | Admitting: Neurology

## 2022-10-27 ENCOUNTER — Other Ambulatory Visit: Payer: Self-pay | Admitting: Neurology

## 2022-10-27 NOTE — Telephone Encounter (Signed)
PA approved 10/23/22-10/22/23. Ref# [id number].

## 2022-10-27 NOTE — Telephone Encounter (Signed)
Pt last seen on 07/16/22 Follow up scheduled on 01/08/23

## 2022-11-14 ENCOUNTER — Other Ambulatory Visit: Payer: Self-pay | Admitting: Neurology

## 2022-11-23 ENCOUNTER — Encounter: Payer: Self-pay | Admitting: Neurology

## 2022-11-24 ENCOUNTER — Other Ambulatory Visit: Payer: Self-pay | Admitting: *Deleted

## 2022-11-24 MED ORDER — IMIPRAMINE HCL 25 MG PO TABS: ORAL_TABLET | ORAL | 5 refills | Status: DC

## 2022-11-27 ENCOUNTER — Encounter: Payer: Self-pay | Admitting: Neurology

## 2022-11-27 ENCOUNTER — Other Ambulatory Visit: Payer: Self-pay | Admitting: Neurology

## 2022-11-27 DIAGNOSIS — G35 Multiple sclerosis: Secondary | ICD-10-CM

## 2022-11-30 ENCOUNTER — Other Ambulatory Visit: Payer: Self-pay | Admitting: Neurology

## 2022-12-01 ENCOUNTER — Other Ambulatory Visit: Payer: Self-pay | Admitting: Neurology

## 2022-12-02 MED ORDER — BACLOFEN 10 MG PO TABS: 10.0000 mg | ORAL_TABLET | Freq: Three times a day (TID) | ORAL | 5 refills | Status: DC

## 2023-01-08 ENCOUNTER — Ambulatory Visit: Payer: Managed Care, Other (non HMO) | Admitting: Neurology

## 2023-02-24 ENCOUNTER — Ambulatory Visit (INDEPENDENT_AMBULATORY_CARE_PROVIDER_SITE_OTHER): Payer: Managed Care, Other (non HMO) | Admitting: Neurology

## 2023-02-24 ENCOUNTER — Encounter: Payer: Self-pay | Admitting: Neurology

## 2023-02-24 VITALS — BP 101/68 | HR 96 | Ht 62.0 in | Wt 133.0 lb

## 2023-02-24 DIAGNOSIS — Z79899 Other long term (current) drug therapy: Secondary | ICD-10-CM | POA: Diagnosis not present

## 2023-02-24 DIAGNOSIS — R252 Cramp and spasm: Secondary | ICD-10-CM

## 2023-02-24 DIAGNOSIS — R269 Unspecified abnormalities of gait and mobility: Secondary | ICD-10-CM | POA: Diagnosis not present

## 2023-02-24 DIAGNOSIS — M21372 Foot drop, left foot: Secondary | ICD-10-CM

## 2023-02-24 DIAGNOSIS — G35 Multiple sclerosis: Secondary | ICD-10-CM | POA: Diagnosis not present

## 2023-02-24 DIAGNOSIS — I7300 Raynaud's syndrome without gangrene: Secondary | ICD-10-CM

## 2023-02-24 NOTE — Progress Notes (Signed)
[facility]  PATIENT: Toni Weaver DOB: 06-12-1991  REFERRING DOCTOR OR PCP: PCP is Tracey Harries, MD SOURCE: Patient, notes from Dr. Anne Hahn, imaging and lab reports, MRI images personally reviewed.  _________________________________   HISTORICAL  CHIEF COMPLAINT:  Chief Complaint  Patient presents with   Room 10    Pt is here Alone. Pt states that she is doing well. Pt states that she is trying to get her strength back. Pt states that she doesn't have any complaints to discuss with Dr. Epimenio Foot.     HISTORY OF PRESENT ILLNESS:  Toni Weaver is a 32 year-old woman with relapsing remitting multiple sclerosis who is currently on Kesimpta.  Update 02/24/2023 She is doing well on Kesimpta and tolerates it well.    MRI of the brain and cervical spie 09/28/2022 showed no new lesions.  Due to left leg spasticity and right > left weakness, gait is impaired  She can walk 30 minutes using her walking sticks.   She uses a cane for shorter distance.  She has no recent fall.   She has a left foot drop. She is noting more left foot/leg spasticity.  She is on baclofen.    She tires out quickly with her walking.     Although she can walk sort distances without the cane, she is safer and faster with the cane.     Her right leg and both arms/hands feel fine.    She notes some spasticity in her legs that are often painful, especially on the left.   Baclofen helps spasticity some and is well tolerated   Tizanidine helps but makes her sleepy so she just takes prn.   She has done PT  She noted some foot numbness but no dysesthesias. .     She reports bladder and bowel are both better.   She takes Senokot which helps regularity.     Vision is doing well  She has fatigue later in the day but it is not that limiting.e.     She sleeps well most nights.  She reports some cognitive issues and has made some errors at work.   She is on Lexapro and bupropion for mood  Verapamil was added for  Raynaud's syndrome with benefit.   Hands are better but still has cold feet.    Headaches have done better since she started working from home.   Has not needed Maxalt.     She takes Vit D supplements  MS history:  In 2015, she lost vision OS over one day and she was diagnosed with optic neuritis.   An MRi was c/w MS.   She was intially placed on Rebif.   She had breakthrough disease, she switched to Tysabri in 2018.   She has tolerated it well and has no exacerbaon or MRi changes.   She converted to JCV Ab positive 04/2019 (0.67) and repeat titer was 0.59 10/24/2019.  However, in September 2021 it was negative at 0.35.   She switched to [facility] April 2022 due to concern of PML.   Data reviewed: MRI of the cervical and thoracic spine 03/10/2021 showed lesions at T2-T3, T3-T4, T4-T5, T6-T7, T10, T10-T11, and T11-T12 MRI of the brain 03/09/2021 showed T2/flair foci in the left cerebellar hemisphere, middle cerebellar peduncles and in the periventricular, juxtacortical and deep white matter of both hemispheres.  None of the foci enhanced.  MRI brain 12/01/2019 (report only):  No change from 2/24.2020.    MRI cervical  spine 12/01/2019 (report only):  Patchy T2 hyperintense signal changed   MRI of the brain 11/12/2015 showed T2/flair hyperintense foci in the left cerebellar hemisphere, middle cerebellar peduncles and in the periventricular, juxtacortical and deep white matter of both hemispheres.  None of the foci enhanced.  MRI of the cervical spine 11/12/2015 showed subtle T2 hyperintense foci within the spinal cord at C2-C3, C4-C5, C7 and T2-T3.  None of these enhance.  MRI of the brain 01/28/2017 was unchanged compared to the 11/12/2015  MRI of the brain 11/30/2019 was unchanged compared to the MRI from 2018.  MRI of the cervical spine 11/30/2019 was unchanged compared to the MRI from 11/12/2015.  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  Antibody titer 10/24/2019 was low positive at  0.59..  Due to the possible need to switch medications, she had hepatitis, TB and HIV labs which did not show any active infections.  She is varicella immune.      The MRI of the brain 09/23/2022 shows multiple T2/FLAIR hyperintense foci in the periventricular, juxtacortical and deep white matter of the cerebral hemispheres.  Foci also noted in the left cerebellar hemisphere and subtly in the right cerebellar hemisphere.  There is normal enhancement pattern.  No change compared to the MRI from 03/10/2021.     MRI of the cervical spine 09/23/2022 shows multiple T2 hyperintense foci within the spinal cord.  These are located towards the right adjacent to C2 and C3, to the left adjacent to C2-C3, posteriorly adjacent to C3-C4, to the left adjacent to C5-C6, to the right adjacent to C7.  Additionally, on the sagittal images there are foci in the thoracic spine adjacent to T2-T3 and T3-T4.  There is normal enhancement pattern.  No degenerative changes noted in the spine.  No definite change compared to the MRI from 03/10/2021.  REVIEW OF SYSTEMS: Constitutional: No fevers, chills, sweats, or change in appetite Eyes: No visual changes, double vision, eye pain Ear, nose and throat: No hearing loss, ear pain, nasal congestion, sore throat Cardiovascular: No chest pain, palpitations Respiratory:  No shortness of breath at rest or with exertion.   No wheezes GastrointestinaI: No nausea, vomiting, diarrhea, abdominal pain, fecal incontinence Genitourinary:  No dysuria, urinary retention or frequency.  No nocturia. Musculoskeletal:  No neck pain, back pain Integumentary: No rash, pruritus, skin lesions Neurological: as above Psychiatric: No depression at this time.  No anxiety Endocrine: No palpitations, diaphoresis, change in appetite, change in weigh or increased thirst Hematologic/Lymphatic:  No anemia, purpura, petechiae. Allergic/Immunologic: No itchy/runny eyes, nasal congestion, recent allergic reactions,  rashes  ALLERGIES: No Known Allergies  HOME MEDICATIONS:  Current Outpatient Medications:    baclofen (LIORESAL) 10 MG tablet, Take 1-2 tablets (10-20 mg total) by mouth 3 (three) times daily., Disp: 180 tablet, Rfl: 5   buPROPion (WELLBUTRIN XL) 150 MG 24 hr tablet, Take 150 mg by mouth daily., Disp: , Rfl:    diclofenac Sodium (VOLTAREN) 1 % GEL, Apply 4 g topically 4 (four) times daily as needed., Disp: , Rfl:    escitalopram (LEXAPRO) 10 MG tablet, Take 10 mg by mouth daily., Disp: , Rfl:    imipramine (TOFRANIL) 25 MG tablet, TAKE 1 TABLET(25 MG) BY MOUTH AT BEDTIME, Disp: 30 tablet, Rfl: 5   KESIMPTA 20 MG/0.4ML SOAJ, Inject 1 pen (20 mg dose) under the skin every 4 weeks, Disp: 0.4 mL, Rfl: 11   Multiple Vitamin (MULTIVITAMIN) tablet, Take 1 tablet by mouth daily., Disp: ,  Rfl:    ondansetron (ZOFRAN ODT) 4 MG disintegrating tablet, Take 1 tablet (4 mg total) by mouth every 8 (eight) hours as needed for nausea or vomiting., Disp: 30 tablet, Rfl: 1   rizatriptan (MAXALT) 10 MG tablet, TAKE 1 TABLET BY MOUTH AS NEEDED FOR MIGRAINE, MAY REPEAT ONCE IN 2 HOURS AS NEEDED, NOT TO EXCEED 2 TABLETS IN 24 HOURS, Disp: 10 tablet, Rfl: 1   tiZANidine (ZANAFLEX) 2 MG tablet, Take 1 tablet (2 mg total) by mouth at bedtime as needed for muscle spasms., Disp: 90 tablet, Rfl: 3   verapamil (CALAN-SR) 120 MG CR tablet, TAKE 1 TABLET(120 MG) BY MOUTH AT BEDTIME, Disp: 90 tablet, Rfl: 1   dalfampridine 10 MG TB12, One po q12 hours (Patient not taking: Reported on 07/03/2022), Disp: 60 tablet, Rfl: 11  PAST MEDICAL HISTORY: Past Medical History:  Diagnosis Date   Abnormality of gait 11/05/2015   Multiple sclerosis (HCC) 11/24/2012   Optic neuritis 11/24/2012    PAST SURGICAL HISTORY: Past Surgical History:  Procedure Laterality Date   APPENDECTOMY      FAMILY HISTORY: Family History  Problem Relation Age of Onset   Hypertension Mother    Hypertension Father    Multiple sclerosis Paternal Aunt     Cancer Maternal Grandmother        Breast cancer    SOCIAL HISTORY:  Social History   Socioeconomic History   Marital status: Single    Spouse name: Not on file   Number of children: 0   Years of education: In college   Highest education level: Not on file  Occupational History    Employer: OTHER    Comment: Pt. works at [employer]  Tobacco Use   Smoking status: Never   Smokeless tobacco: Never  Substance and Sexual Activity   Alcohol use: No   Drug use: No   Sexual activity: Not on file  Other Topics Concern   Not on file  Social History Narrative   Lives at home w/ mom.   Patient is right handed.   Rarely drinks caffeine.   Social Determinants of Health   Financial Resource Strain: Low Risk  (02/13/2023)   Received from [facility]   Overall Financial Resource Strain (CARDIA)    Difficulty of Paying Living Expenses: Not hard at all  Food Insecurity: No Food Insecurity (02/13/2023)   Received from [facility]   Hunger Vital Sign    Worried About Running Out of Food in the Last Year: Never true    Ran Out of Food in the Last Year: Never true  Transportation Needs: No Transportation Needs (02/13/2023)   Received from [facility] - Transportation    Lack of Transportation (Medical): No    Lack of Transportation (Non-Medical): No  Physical Activity: Sufficiently Active (02/13/2023)   Received from [facility]   Exercise Vital Sign    Days of Exercise per Week: 5 days    Minutes of Exercise per Session: 30 min  Stress: No Stress Concern Present (02/13/2023)   Received from [facility] Occupational [facility] - Occupational Stress Questionnaire    Feeling of Stress : Only a little  Social Connections: Moderately Integrated (02/13/2023)   Received from [facility]   Social Network    How would you rate your social network (family, work, friends)?: Adequate participation with social networks  Intimate Partner  Violence: Not At Risk (02/13/2023)   Received from [facility]  HITS    Over the last 12 months how often did your partner physically hurt you?: 1    Over the last 12 months how often did your partner insult you or talk down to you?: 1    Over the last 12 months how often did your partner threaten you with physical harm?: 1    Over the last 12 months how often did your partner scream or curse at you?: 1     PHYSICAL EXAM  Vitals:   02/24/23 1259  BP: 101/68  Pulse: 96  Weight: 133 lb (60.3 kg)  Height: 5\' 2"  (1.575 m)     Body mass index is 24.33 kg/m.   General: The patient is well-developed and well-nourished and in no acute distress  HEENT:  Head is [place]/AT.  Sclera are anicteric.    Skin: Extremities are without rash or  edema.  Neurologic Exam  Mental status: The patient is alert and oriented x 3 at the time of the examination. The patient has apparent normal recent and remote memory, with an apparently normal attention span and concentration ability.   Speech is normal.  Cranial nerves: Extraocular movements are full.  Color vision was symmetric.  Facial strength and sensation was normal.  No dysarthria.  No obvious hearing deficits are noted.  Motor:  Muscle bulk is normal.   Muscle tone is increased in the left leg.  Strength is 4/5 in the left foot and ankle extensors and 5/5 elsewhere.    Sensory: Sensory testing is intact to pinprick, soft touch and vibration sensation in the arms.  In the legs, vibration sensation was reduced on the left.  Touch and temperature was symmetric.  Coordination: Cerebellar testing reveals good finger-nose-finger and reduced heel-to-shin .  Gait and station: Station is normal.  The gait is spastic with a left foot drop.  Gait shows left foot drop and spasticity.    Does better with cane.  .   Cannot tandem.  Romberg is borderline  Reflexes: Deep tendon reflexes are symmetric and normal in arms and 3+ at the knees with spread, more  on the left.  There is no ankle clonus.Marland Kitchen        DIAGNOSTIC DATA (LABS, IMAGING, TESTING) - I reviewed patient records, labs, notes, testing and imaging myself where available.  Lab Results  Component Value Date   WBC 6.6 07/03/2022   HGB 12.5 07/03/2022   HCT 38.0 07/03/2022   MCV 85 07/03/2022   PLT 382 07/03/2022      Component Value Date/Time   NA 141 11/07/2021 1633   K 4.0 11/07/2021 1633   CL 102 11/07/2021 1633   CO2 25 11/07/2021 1633   GLUCOSE 80 11/07/2021 1633   GLUCOSE 154 (H) 03/10/2021 0303   BUN 15 11/07/2021 1633   CREATININE 0.67 11/07/2021 1633   CALCIUM 9.3 11/07/2021 1633   PROT 6.6 11/07/2021 1633   ALBUMIN 4.5 11/07/2021 1633   AST 14 11/07/2021 1633   ALT 9 11/07/2021 1633   ALKPHOS 40 (L) 11/07/2021 1633   BILITOT <0.2 11/07/2021 1633   GFRNONAA >60 03/10/2021 0303   GFRAA 133 10/25/2019 1647       ASSESSMENT AND PLAN  Multiple sclerosis (HCC) - Plan: IgG, IgA, IgM, CBC with Differential/Platelet  Abnormality of gait  High risk medication use - Plan: IgG, IgA, IgM, CBC with Differential/Platelet  Spasticity  Left foot drop  Raynaud's disease without gangrene   1.  Continue Kesimpta.   Check labs .  Consider repeat imaging in 2025.    2.  Exercise as tolerated 3.   Continue baclofen 20 mg 3 times daily.  Ok to take tizanidine as needed (does not take it often as it makes her sleepy) 4.  Consider AFO brace may help the left foot drop  5.  return in 12 months or sooner if there are new or worsening neurologic symptoms.  Azka Steger A. Epimenio Foot, MD, [facility] 02/24/2023, 1:40 PM Certified in Neurology, Clinical Neurophysiology, Sleep Medicine and Neuroimaging  [facility] [address] [place], [place] [address] [phone]

## 2023-02-25 LAB — CBC WITH DIFFERENTIAL/PLATELET
Basophils Absolute: 0.1 10*3/uL (ref 0.0–0.2)
Basos: 2 %
EOS (ABSOLUTE): 0.1 10*3/uL (ref 0.0–0.4)
Eos: 1 %
Hematocrit: 37.2 % (ref 34.0–46.6)
Hemoglobin: 12.2 g/dL (ref 11.1–15.9)
Immature Grans (Abs): 0 10*3/uL (ref 0.0–0.1)
Immature Granulocytes: 0 %
Lymphocytes Absolute: 1.5 10*3/uL (ref 0.7–3.1)
Lymphs: 33 %
MCH: 27.9 pg (ref 26.6–33.0)
MCHC: 32.8 g/dL (ref 31.5–35.7)
MCV: 85 fL (ref 79–97)
Monocytes Absolute: 0.5 10*3/uL (ref 0.1–0.9)
Monocytes: 12 %
Neutrophils Absolute: 2.3 10*3/uL (ref 1.4–7.0)
Neutrophils: 52 %
Platelets: 374 10*3/uL (ref 150–450)
RBC: 4.37 x10E6/uL (ref 3.77–5.28)
RDW: 13.3 % (ref 11.7–15.4)
WBC: 4.4 10*3/uL (ref 3.4–10.8)

## 2023-02-25 LAB — IGG, IGA, IGM
IgA/Immunoglobulin A, Serum: 272 mg/dL (ref 87–352)
IgG (Immunoglobin G), Serum: 885 mg/dL (ref 586–1602)
IgM (Immunoglobulin M), Srm: 29 mg/dL (ref 26–217)

## 2023-03-22 ENCOUNTER — Encounter: Payer: Self-pay | Admitting: Neurology

## 2023-03-22 ENCOUNTER — Other Ambulatory Visit: Payer: Self-pay | Admitting: Neurology

## 2023-03-24 ENCOUNTER — Telehealth: Payer: Self-pay

## 2023-03-24 NOTE — Telephone Encounter (Signed)
LVM for pt to call back so that we can fill out her forms properly.

## 2023-03-25 ENCOUNTER — Telehealth: Payer: Self-pay

## 2023-03-25 ENCOUNTER — Other Ambulatory Visit: Payer: Self-pay

## 2023-03-25 MED ORDER — RIZATRIPTAN BENZOATE 10 MG PO TABS: 10.0000 mg | ORAL_TABLET | ORAL | 3 refills | Status: DC | PRN

## 2023-03-25 NOTE — Telephone Encounter (Signed)
Called pt and spoke with her about her FMLA Paperwork.

## 2023-03-25 NOTE — Telephone Encounter (Signed)
Faxed FMLA paperwork to [phone] on 03/25/2023

## 2023-03-25 NOTE — Telephone Encounter (Signed)
Last seen on 02/24/23 Follow up scheduled on 02/25/24  Per note 02/24/23 " Headaches have done better since she started working from home.   Has not needed Maxalt "

## 2023-05-24 ENCOUNTER — Other Ambulatory Visit: Payer: Self-pay | Admitting: Neurology

## 2023-05-25 NOTE — Telephone Encounter (Signed)
Last seen on 02/24/23 Follow up 02/25/24

## 2023-05-27 ENCOUNTER — Encounter: Payer: Self-pay | Admitting: Neurology

## 2023-05-28 ENCOUNTER — Other Ambulatory Visit: Payer: Self-pay | Admitting: Neurology

## 2023-06-04 ENCOUNTER — Telehealth: Payer: Self-pay | Admitting: Neurology

## 2023-06-04 MED ORDER — VERAPAMIL HCL ER 120 MG PO TBCR: EXTENDED_RELEASE_TABLET | ORAL | 3 refills | Status: DC

## 2023-06-04 NOTE — Telephone Encounter (Signed)
Resent to [employer]. I called pt and LVM letting her know update.

## 2023-06-04 NOTE — Telephone Encounter (Signed)
Marylene Land from [facility]  reports that the Rx for pt's verapamil  needs to go to a retail pharmacy

## 2023-06-28 ENCOUNTER — Other Ambulatory Visit: Payer: Self-pay | Admitting: Neurology

## 2023-06-29 ENCOUNTER — Encounter: Payer: Self-pay | Admitting: Neurology

## 2023-09-16 ENCOUNTER — Other Ambulatory Visit: Payer: Self-pay | Admitting: Neurology

## 2023-10-16 ENCOUNTER — Other Ambulatory Visit (HOSPITAL_COMMUNITY): Payer: Self-pay

## 2023-10-16 ENCOUNTER — Telehealth: Payer: Self-pay | Admitting: Pharmacy Technician

## 2023-10-16 NOTE — Telephone Encounter (Signed)
 Pharmacy Patient Advocate Encounter   Received notification from CoverMyMeds that prior authorization for Kesimpta 20MG /0.4ML auto-injectors is required/requested.   Insurance verification completed.   The patient is insured through [facility] .   Per test claim: PA required; PA submitted to above mentioned insurance via CoverMyMeds Key/confirmation #/EOC [id number] Status is pending

## 2023-10-21 ENCOUNTER — Other Ambulatory Visit (HOSPITAL_COMMUNITY): Payer: Self-pay

## 2023-10-21 NOTE — Telephone Encounter (Signed)
 Pharmacy Patient Advocate Encounter  Received notification from [facility] that Prior Authorization for Kesimpta 20MG /0.4ML auto-injectors has been APPROVED from 10/23/2023 to 10/21/2024. Unable to obtain price due to refill too soon rejection, last fill date 10/21/2023 next available fill date4/06/2024   PA #/Case ID/Reference #: [id number]

## 2023-11-09 ENCOUNTER — Other Ambulatory Visit: Payer: Self-pay | Admitting: Neurology

## 2023-11-10 NOTE — Telephone Encounter (Signed)
Last seen on 02/24/23 Follow up scheduled on 02/25/24  Per note 02/24/23 " Headaches have done better since she started working from home.   Has not needed Maxalt "

## 2023-11-14 ENCOUNTER — Other Ambulatory Visit: Payer: Self-pay | Admitting: Neurology

## 2023-11-14 DIAGNOSIS — G35 Multiple sclerosis: Secondary | ICD-10-CM

## 2023-12-17 ENCOUNTER — Other Ambulatory Visit: Payer: Self-pay | Admitting: Neurology

## 2023-12-17 NOTE — Telephone Encounter (Signed)
 Last seen on 02/24/23 Follow up scheduled on 02/25/24  Did you want patient to continue Rx? I didn't see it mentioned in chart.

## 2023-12-23 ENCOUNTER — Encounter: Payer: Self-pay | Admitting: Neurology

## 2023-12-23 ENCOUNTER — Other Ambulatory Visit: Payer: Self-pay | Admitting: Neurology

## 2023-12-24 ENCOUNTER — Other Ambulatory Visit: Payer: Self-pay

## 2023-12-29 ENCOUNTER — Other Ambulatory Visit: Payer: Self-pay | Admitting: Neurology

## 2023-12-29 DIAGNOSIS — G35 Multiple sclerosis: Secondary | ICD-10-CM

## 2023-12-29 DIAGNOSIS — R269 Unspecified abnormalities of gait and mobility: Secondary | ICD-10-CM

## 2023-12-30 ENCOUNTER — Telehealth: Payer: Self-pay | Admitting: Neurology

## 2023-12-30 NOTE — Telephone Encounter (Signed)
 sent to [facility] they obtain [place] [phone]

## 2024-01-12 ENCOUNTER — Encounter: Payer: Self-pay | Admitting: Neurology

## 2024-01-12 NOTE — Telephone Encounter (Signed)
 NPAF prescriber form completed for Kesimpta  and give to Dr. Godwin Lat for review and signature.

## 2024-01-14 ENCOUNTER — Telehealth: Payer: Self-pay

## 2024-01-14 NOTE — Telephone Encounter (Signed)
 Faxed off [employer] form to [employer] [phone] on 01/14/2024.

## 2024-01-17 ENCOUNTER — Other Ambulatory Visit: Payer: Self-pay | Admitting: Neurology

## 2024-01-19 NOTE — Telephone Encounter (Signed)
 Last seen on 02/24/23 Follow up scheduled on 02/25/24

## 2024-01-20 NOTE — Telephone Encounter (Signed)
 Received fax from [employer] that they needs pt first 2 pages of most recent tax return and household size. I called pt and LVM asking she call them back at [phone] to provide this info.

## 2024-01-21 ENCOUNTER — Telehealth: Payer: Self-pay | Admitting: Neurology

## 2024-01-21 NOTE — Telephone Encounter (Signed)
 LVM and sent mychart msg informing pt of need to reschedule 02/25/24 appt - MD out

## 2024-01-21 NOTE — Telephone Encounter (Signed)
 Patient reschedule appointment 05/17/24 at 3:00 pm

## 2024-02-09 ENCOUNTER — Encounter: Payer: Self-pay | Admitting: Neurology

## 2024-02-21 ENCOUNTER — Other Ambulatory Visit: Payer: Self-pay | Admitting: Neurology

## 2024-02-22 NOTE — Telephone Encounter (Signed)
 Last seen on 02/24/23 per note  Continue baclofen  20 mg 3 times daily.   Follow up scheduled on 02/23/24

## 2024-02-23 ENCOUNTER — Encounter: Payer: Self-pay | Admitting: Neurology

## 2024-02-23 ENCOUNTER — Ambulatory Visit: Payer: Self-pay | Admitting: Neurology

## 2024-02-23 VITALS — BP 90/60 | HR 107 | Ht 62.0 in | Wt 135.5 lb

## 2024-02-23 DIAGNOSIS — R269 Unspecified abnormalities of gait and mobility: Secondary | ICD-10-CM

## 2024-02-23 DIAGNOSIS — H469 Unspecified optic neuritis: Secondary | ICD-10-CM

## 2024-02-23 DIAGNOSIS — Z79899 Other long term (current) drug therapy: Secondary | ICD-10-CM

## 2024-02-23 DIAGNOSIS — G35 Multiple sclerosis: Secondary | ICD-10-CM

## 2024-02-23 DIAGNOSIS — R252 Cramp and spasm: Secondary | ICD-10-CM

## 2024-02-23 MED ORDER — PREDNISONE 50 MG PO TABS: ORAL_TABLET | ORAL | 0 refills | Status: DC

## 2024-02-23 NOTE — Progress Notes (Signed)
 [facility]  PATIENT: Toni Weaver DOB: 03-11-1991  REFERRING DOCTOR OR PCP: PCP is Alm Bilis, MD SOURCE: Patient, notes from Dr. Jenel, imaging and lab reports, MRI images personally reviewed.  _________________________________   HISTORICAL  CHIEF COMPLAINT:  Chief Complaint  Patient presents with   Follow-up    RM 10, alone. Father came with her and is in lobby. Ambulates with walker.  Wears glasses. Last eye exam this year. About a couple months ago.     HISTORY OF PRESENT ILLNESS:  Toni Grygiel is a 33 year-old woman with relapsing remitting multiple sclerosis who is currently on Kesimpta .  Update 02/24/2023 She was doing well on Kesimpta  and tolerated it well.    Unfortunately, she lost her job and has not taken x 3 months.  She is in the process of applying for patient assistance.   MRI of the brain and cervical spie 09/28/2022 showed no new lesions.  She notes gait is doing worse.   Possible exacerbation as chang was somewhat sudden.   Due to left l> right eg spasticity and right > left weakness , gait is impaired  She uses walking sticks or a walker but cannotgo far (200 feet). She has a left foot drop. She is noting more left foot/leg spasticity.  She is on baclofen .    She tires out quickly with her walking.   Her right leg and both arms/hands feel fine.    She notes some spasticity in her legs that are often painful, especially on the left.   Baclofen  10 mg x 6 helps spasticity some and is well tolerated   Tizanidine  helps but makes her sleepy so she just takes prn.      She has foot numbness but no dysesthesias. .   She reports bladder and bowel are both better.   She takes Senokot which helps regularity.     Vision is doing well  She has fatigue later in the day but it is not that limiting.e.     She sleeps well most nights.  She reports some cognitive issues and has made some errors at work.   She is on Lexapro  and bupropion for  mood  Verapamil  was added for Raynaud's syndrome with benefit.   Hands are better but still has cold feet.    Headaches have done better since she started working from home.   Has not needed Maxalt .     She takes Vit D supplements  MS history:  In 2015, she lost vision OS over one day and she was diagnosed with optic neuritis.   An MRi was c/w MS.   She was intially placed on Rebif .   She had breakthrough disease, she switched to Tysabri in 2018.   She has tolerated it well and has no exacerbaon or MRi changes.   She converted to JCV Ab positive 04/2019 (0.67) and repeat titer was 0.59 10/24/2019.  However, in September 2021 it was negative at 0.35.   She switched to Kesimpta  April 2022 due to concern of PML.   Data reviewed: MRI of the cervical and thoracic spine 03/10/2021 showed lesions at T2-T3, T3-T4, T4-T5, T6-T7, T10, T10-T11, and T11-T12 MRI of the brain 03/09/2021 showed T2/flair foci in the left cerebellar hemisphere, middle cerebellar peduncles and in the periventricular, juxtacortical and deep white matter of both hemispheres.  None of the foci enhanced.  MRI brain 12/01/2019 (report only):  No change from 2/24.2020.    MRI cervical spine 12/01/2019 (  report only):  Patchy T2 hyperintense signal changed   MRI of the brain 11/12/2015 showed T2/flair hyperintense foci in the left cerebellar hemisphere, middle cerebellar peduncles and in the periventricular, juxtacortical and deep white matter of both hemispheres.  None of the foci enhanced.  MRI of the cervical spine 11/12/2015 showed subtle T2 hyperintense foci within the spinal cord at C2-C3, C4-C5, C7 and T2-T3.  None of these enhance.  MRI of the brain 01/28/2017 was unchanged compared to the 11/12/2015  MRI of the brain 11/30/2019 was unchanged compared to the MRI from 2018.  MRI of the cervical spine 11/30/2019 was unchanged compared to the MRI from 11/12/2015.  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  Antibody  titer 10/24/2019 was low positive at 0.59..  Due to the possible need to switch medications, she had hepatitis, TB and HIV labs which did not show any active infections.  She is varicella immune.      The MRI of the brain 09/23/2022 shows multiple T2/FLAIR hyperintense foci in the periventricular, juxtacortical and deep white matter of the cerebral hemispheres.  Foci also noted in the left cerebellar hemisphere and subtly in the right cerebellar hemisphere.  There is normal enhancement pattern.  No change compared to the MRI from 03/10/2021.     MRI of the cervical spine 09/23/2022 shows multiple T2 hyperintense foci within the spinal cord.  These are located towards the right adjacent to C2 and C3, to the left adjacent to C2-C3, posteriorly adjacent to C3-C4, to the left adjacent to C5-C6, to the right adjacent to C7.  Additionally, on the sagittal images there are foci in the thoracic spine adjacent to T2-T3 and T3-T4.  There is normal enhancement pattern.  No degenerative changes noted in the spine.  No definite change compared to the MRI from 03/10/2021.  REVIEW OF SYSTEMS: Constitutional: No fevers, chills, sweats, or change in appetite Eyes: No visual changes, double vision, eye pain Ear, nose and throat: No hearing loss, ear pain, nasal congestion, sore throat Cardiovascular: No chest pain, palpitations Respiratory:  No shortness of breath at rest or with exertion.   No wheezes GastrointestinaI: No nausea, vomiting, diarrhea, abdominal pain, fecal incontinence Genitourinary:  No dysuria, urinary retention or frequency.  No nocturia. Musculoskeletal:  No neck pain, back pain Integumentary: No rash, pruritus, skin lesions Neurological: as above Psychiatric: No depression at this time.  No anxiety Endocrine: No palpitations, diaphoresis, change in appetite, change in weigh or increased thirst Hematologic/Lymphatic:  No anemia, purpura, petechiae. Allergic/Immunologic: No itchy/runny eyes, nasal  congestion, recent allergic reactions, rashes  ALLERGIES: No Known Allergies  HOME MEDICATIONS:  Current Outpatient Medications:    baclofen  (LIORESAL ) 10 MG tablet, TAKE 1 TO 2 TABLETS(10 TO 20 MG) BY MOUTH THREE TIMES DAILY, Disp: 180 tablet, Rfl: 0   buPROPion (WELLBUTRIN XL) 150 MG 24 hr tablet, Take 150 mg by mouth daily., Disp: , Rfl:    dalfampridine  10 MG TB12, One po q12 hours, Disp: 60 tablet, Rfl: 11   diclofenac Sodium (VOLTAREN) 1 % GEL, Apply 4 g topically 4 (four) times daily as needed., Disp: , Rfl:    escitalopram  (LEXAPRO ) 10 MG tablet, Take 10 mg by mouth daily., Disp: , Rfl:    imipramine  (TOFRANIL ) 25 MG tablet, TAKE 1 TABLET(25 MG) BY MOUTH AT BEDTIME, Disp: 30 tablet, Rfl: 5   KESIMPTA  20 MG/0.4ML SOAJ, Inject 1 pen (20 mg dose) under the skin every 4 weeks, Disp: 0.4 mL, Rfl: 3  Multiple Vitamin (MULTIVITAMIN) tablet, Take 1 tablet by mouth daily., Disp: , Rfl:    ondansetron  (ZOFRAN -ODT) 4 MG disintegrating tablet, DISSOLVE 1 TABLET(4 MG) ON THE TONGUE EVERY 8 HOURS AS NEEDED FOR NAUSEA OR VOMITING, Disp: 30 tablet, Rfl: 1   predniSONE  (DELTASONE ) 50 MG tablet, 12 pills (600 mg) po every day x 5 days for MS exacerbation., Disp: 60 tablet, Rfl: 0   rizatriptan  (MAXALT ) 10 MG tablet, TAKE 1 TABLET BY MOUTH AS NEEDED FOR MIGRAINE. MAY REPEAT IN 2 HOURS IF NEEDED, Disp: 10 tablet, Rfl: 2   tiZANidine  (ZANAFLEX ) 2 MG tablet, Take 1 tablet (2 mg total) by mouth at bedtime as needed for muscle spasms., Disp: 90 tablet, Rfl: 3   verapamil  (CALAN -SR) 120 MG CR tablet, One po qd, Disp: 90 tablet, Rfl: 3  PAST MEDICAL HISTORY: Past Medical History:  Diagnosis Date   Abnormality of gait 11/05/2015   Multiple sclerosis (HCC) 11/24/2012   Optic neuritis 11/24/2012    PAST SURGICAL HISTORY: Past Surgical History:  Procedure Laterality Date   APPENDECTOMY      FAMILY HISTORY: Family History  Problem Relation Age of Onset   Hypertension Mother    Hypertension Father     Multiple sclerosis Paternal Aunt    Cancer Maternal Grandmother        Breast cancer    SOCIAL HISTORY:  Social History   Socioeconomic History   Marital status: Single    Spouse name: Not on file   Number of children: 0   Years of education: college   Highest education level: Not on file  Occupational History    Employer: OTHER    Comment: Pt. works at [employer]  Tobacco Use   Smoking status: Never   Smokeless tobacco: Never  Substance and Sexual Activity   Alcohol use: No   Drug use: No   Sexual activity: Not on file  Other Topics Concern   Not on file  Social History Narrative   Lives at home w/ mom.   Patient is right handed.   caffeine.-coffee daily, tea sometimes   Social Drivers of Corporate investment banker Strain: Low Risk  (07/31/2023)   Received from [facility]   Overall Financial Resource Strain (CARDIA)    Difficulty of Paying Living Expenses: Not hard at all  Food Insecurity: No Food Insecurity (07/31/2023)   Received [facility]   Hunger Vital Sign    Within the past 12 months, you worried that your food would run out before you got the money to buy more.: Never true    Within the past 12 months, the food you bought just didn't last and you didn't have money to get more.: Never true  Transportation Needs: No Transportation Needs (07/31/2023)   Received from [facility] - Transportation    Lack of Transportation (Medical): No    Lack of Transportation (Non-Medical): No  Physical Activity: Sufficiently Active (02/13/2023)   Received from [facility]   Exercise Vital Sign    On average, how many days per week do you engage in moderate to strenuous exercise (like a brisk walk)?: 5 days    On average, how many minutes do you engage in exercise at this level?: 30 min  Stress: No Stress Concern Present (02/13/2023)   Received from [facility] - Occupational Stress  Questionnaire    Feeling of Stress : Only a little  Social Connections: Moderately  Integrated (02/13/2023)   Received from [facility]   Social Network    How would you rate your social network (family, work, friends)?: Adequate participation with social networks  Intimate Partner Violence: Not At Risk (02/13/2023)   Received from [facility]   HITS    Over the last 12 months how often did your partner physically hurt you?: Never    Over the last 12 months how often did your partner insult you or talk down to you?: Never    Over the last 12 months how often did your partner threaten you with physical harm?: Never    Over the last 12 months how often did your partner scream or curse at you?: Never     PHYSICAL EXAM  Vitals:   02/23/24 1358  BP: 90/60  Pulse: (!) 107  SpO2: 97%  Weight: 135 lb 8 oz (61.5 kg)  Height: 5' 2 (1.575 m)     Body mass index is 24.78 kg/m.   General: The patient is well-developed and well-nourished and in no acute distress  HEENT:  Head is [place]/AT.  Sclera are anicteric.    Skin: Extremities are without rash or  edema.  Neurologic Exam  Mental status: The patient is alert and oriented x 3 at the time of the examination. The patient has apparent normal recent and remote memory, with an apparently normal attention span and concentration ability.   Speech is normal.  Cranial nerves: Extraocular movements are full.  Color vision was symmetric.  Facial strength and sensation was normal.  No dysarthria.  No obvious hearing deficits are noted.  Motor:  Muscle bulk is normal.   Muscle tone is increased in the left > right legs.  Strength is 4/5 in the left foot and ankle extensors and 4+/5 elsewhere in legs    Sensory: Sensory testing is intact to pinprick, soft touch and vibration sensation in the arms.  In the legs, vibration sensation was reduced on the left.  Touch and temperature was symmetric.  Coordination: Cerebellar testing reveals good  finger-nose-finger and reduced heel-to-shin .  Gait and station: Station is normal.  The gait is spastic      Does better with walker.  .   Cannot tandem.  Romberg is borderline  Reflexes: Deep tendon reflexes are symmetric and normal in arms and 3+ at the knees with spread, more on the left.  There is no ankle clonus.SABRA        DIAGNOSTIC DATA (LABS, IMAGING, TESTING) - I reviewed patient records, labs, notes, testing and imaging myself where available.  Lab Results  Component Value Date   WBC 4.4 02/24/2023   HGB 12.2 02/24/2023   HCT 37.2 02/24/2023   MCV 85 02/24/2023   PLT 374 02/24/2023      Component Value Date/Time   NA 141 11/07/2021 1633   K 4.0 11/07/2021 1633   CL 102 11/07/2021 1633   CO2 25 11/07/2021 1633   GLUCOSE 80 11/07/2021 1633   GLUCOSE 154 (H) 03/10/2021 0303   BUN 15 11/07/2021 1633   CREATININE 0.67 11/07/2021 1633   CALCIUM 9.3 11/07/2021 1633   PROT 6.6 11/07/2021 1633   ALBUMIN 4.5 11/07/2021 1633   AST 14 11/07/2021 1633   ALT 9 11/07/2021 1633   ALKPHOS 40 (L) 11/07/2021 1633   BILITOT <0.2 11/07/2021 1633   GFRNONAA >60 03/10/2021 0303   GFRAA 133 10/25/2019 1647       ASSESSMENT AND PLAN  Multiple sclerosis (HCC)  Optic neuritis  Abnormality of gait  High risk medication use  Spasticity   1.  Continue Kesimpta  if possible and she will.   Check labs .  Consider repeat imaging in 2025.    2.  Prednisone  600 mg po every day x 5 d. 3.   Continue baclofen  20 mg 3 times daily.  Ok to take tizanidine  as needed (does not take it often as it makes her sleepy) 4.   return in 6 months or sooner if there are new or worsening neurologic symptoms.  Tharun Cappella A. Vear, MD, [facility] 02/23/2024, 2:43 PM Certified in Neurology, Clinical Neurophysiology, Sleep Medicine and Neuroimaging  [facility] [address] [place], [place] [address] [phone]

## 2024-02-24 ENCOUNTER — Telehealth: Payer: Self-pay | Admitting: *Deleted

## 2024-02-24 NOTE — Telephone Encounter (Signed)
 We just faxed back completed form that included prescriptions, see below. Replied to pt mychart letting her know.

## 2024-02-24 NOTE — Telephone Encounter (Signed)
 Faxed completed/signed form below back to [employer] at [phone]. Received fax confirmation.

## 2024-02-24 NOTE — Telephone Encounter (Signed)
 Jacob from [employer] called wanting to put in the request for a one time fill of the pt's KESIMPTA  20 MG/0.4ML SOAJ that was approved through [employer] program.  [id number]

## 2024-02-25 ENCOUNTER — Ambulatory Visit: Payer: Managed Care, Other (non HMO) | Admitting: Neurology

## 2024-02-25 NOTE — Telephone Encounter (Signed)
 Called [employer] back at [phone], option 2. Spoke w/ [place]. System not functioning right now. He took pt name/DOB and will send message for them to confirm if they received fax from yesterday with prescriptions. They will call back if they did not receive.

## 2024-02-28 ENCOUNTER — Encounter: Payer: Self-pay | Admitting: Neurology

## 2024-02-29 MED ORDER — BACLOFEN 10 MG PO TABS
10.0000 mg | ORAL_TABLET | Freq: Three times a day (TID) | ORAL | 6 refills | Status: AC
  Filled 2024-08-03: qty 180, 30d supply, fill #0
  Filled 2024-08-30: qty 180, 30d supply, fill #1

## 2024-02-29 NOTE — Telephone Encounter (Signed)
 Last seen on 7/22/5/25 Follow up scheduled on 09/27/24

## 2024-03-22 ENCOUNTER — Encounter: Payer: Self-pay | Admitting: Neurology

## 2024-03-29 ENCOUNTER — Telehealth: Payer: Self-pay | Admitting: Neurology

## 2024-03-29 ENCOUNTER — Other Ambulatory Visit: Payer: Self-pay | Admitting: *Deleted

## 2024-03-29 DIAGNOSIS — G35 Multiple sclerosis: Secondary | ICD-10-CM

## 2024-03-29 MED ORDER — KESIMPTA 20 MG/0.4ML ~~LOC~~ SOAJ: 0.4000 mL | SUBCUTANEOUS | 4 refills | Status: AC

## 2024-03-29 NOTE — Telephone Encounter (Signed)
 Rx already e-scribed to pharmacy earlier today.

## 2024-03-29 NOTE — Telephone Encounter (Signed)
 [facility] [place]) requesting prescription for KESIMPTA  20 MG/0.4ML SOAJ.

## 2024-04-24 ENCOUNTER — Other Ambulatory Visit: Payer: Self-pay | Admitting: Neurology

## 2024-04-25 NOTE — Telephone Encounter (Signed)
 Last filled by patient 03/27/24 last visit was 02/23/24 next office visit is sheduled on 09/27/24  per 02/23/24 ov note - Headaches have done better since she started working from home.  Patient has not needed Maxalt .    (medication was not listed to continue please advise.)

## 2024-05-10 ENCOUNTER — Telehealth: Payer: Self-pay | Admitting: Neurology

## 2024-05-10 NOTE — Telephone Encounter (Signed)
 error

## 2024-05-10 NOTE — Telephone Encounter (Signed)
 Pt came in to drop off work accommodations forms. Paid $50 fee.

## 2024-05-11 DIAGNOSIS — Z0289 Encounter for other administrative examinations: Secondary | ICD-10-CM

## 2024-05-12 ENCOUNTER — Telehealth: Payer: Self-pay | Admitting: *Deleted

## 2024-05-12 NOTE — Telephone Encounter (Signed)
 Pt form faxed on 05/12/2024

## 2024-05-17 ENCOUNTER — Ambulatory Visit: Admitting: Neurology

## 2024-05-18 ENCOUNTER — Encounter: Payer: Self-pay | Admitting: Neurology

## 2024-06-24 ENCOUNTER — Encounter: Payer: Self-pay | Admitting: Neurology

## 2024-06-24 ENCOUNTER — Other Ambulatory Visit: Payer: Self-pay

## 2024-06-24 ENCOUNTER — Other Ambulatory Visit: Payer: Self-pay | Admitting: Neurology

## 2024-06-24 NOTE — Telephone Encounter (Signed)
 Last seen on 02/23/24 Follow up scheduled on 09/27/24

## 2024-06-26 ENCOUNTER — Other Ambulatory Visit: Payer: Self-pay | Admitting: Neurology

## 2024-06-27 ENCOUNTER — Other Ambulatory Visit: Payer: Self-pay

## 2024-07-11 ENCOUNTER — Encounter: Payer: Self-pay | Admitting: Neurology

## 2024-08-03 ENCOUNTER — Other Ambulatory Visit: Payer: Self-pay

## 2024-08-03 ENCOUNTER — Other Ambulatory Visit (HOSPITAL_COMMUNITY): Payer: Self-pay

## 2024-08-03 MED ORDER — BUPROPION HCL ER (XL) 150 MG PO TB24
150.0000 mg | ORAL_TABLET | Freq: Every morning | ORAL | 3 refills | Status: AC
  Filled 2024-08-30 – 2024-09-01 (×2): qty 90, 90d supply, fill #0

## 2024-08-03 MED ORDER — ESCITALOPRAM OXALATE 10 MG PO TABS
10.0000 mg | ORAL_TABLET | Freq: Every day | ORAL | 3 refills | Status: AC
  Filled 2024-08-30: qty 90, 90d supply, fill #0

## 2024-08-07 ENCOUNTER — Other Ambulatory Visit (HOSPITAL_COMMUNITY): Payer: Self-pay

## 2024-08-07 MED FILL — Imipramine HCl Tab 25 MG: ORAL | 30 days supply | Qty: 30 | Fill #0 | Status: AC

## 2024-08-08 ENCOUNTER — Other Ambulatory Visit: Payer: Self-pay

## 2024-08-08 ENCOUNTER — Other Ambulatory Visit (HOSPITAL_COMMUNITY): Payer: Self-pay

## 2024-08-30 ENCOUNTER — Other Ambulatory Visit (HOSPITAL_COMMUNITY): Payer: Self-pay

## 2024-08-30 ENCOUNTER — Other Ambulatory Visit: Payer: Self-pay

## 2024-08-30 MED FILL — Imipramine HCl Tab 25 MG: ORAL | 30 days supply | Qty: 30 | Fill #1 | Status: CN

## 2024-08-30 MED FILL — Verapamil HCl Tab ER 120 MG: ORAL | 90 days supply | Qty: 90 | Fill #0 | Status: CN

## 2024-08-31 ENCOUNTER — Other Ambulatory Visit (HOSPITAL_COMMUNITY): Payer: Self-pay

## 2024-08-31 ENCOUNTER — Other Ambulatory Visit: Payer: Self-pay

## 2024-08-31 ENCOUNTER — Other Ambulatory Visit: Payer: Self-pay | Admitting: Neurology

## 2024-08-31 MED ORDER — BACLOFEN 10 MG PO TABS
10.0000 mg | ORAL_TABLET | Freq: Three times a day (TID) | ORAL | 0 refills | Status: AC
  Filled 2024-08-31: qty 180, 30d supply, fill #0

## 2024-08-31 MED ORDER — IMIPRAMINE HCL 25 MG PO TABS
25.0000 mg | ORAL_TABLET | Freq: Every day | ORAL | 0 refills | Status: AC
  Filled 2024-08-31: qty 90, 90d supply, fill #0

## 2024-08-31 NOTE — Telephone Encounter (Signed)
 Last seen on 02/23/24 Follow up scheduled 09/27/24

## 2024-09-01 ENCOUNTER — Other Ambulatory Visit (HOSPITAL_COMMUNITY): Payer: Self-pay

## 2024-09-01 ENCOUNTER — Other Ambulatory Visit: Payer: Self-pay

## 2024-09-06 ENCOUNTER — Other Ambulatory Visit: Payer: Self-pay

## 2024-09-09 ENCOUNTER — Encounter: Payer: Self-pay | Admitting: Family Medicine

## 2024-09-09 ENCOUNTER — Ambulatory Visit: Admitting: Family Medicine

## 2024-09-09 VITALS — BP 104/70 | HR 89 | Temp 97.9°F | Ht 62.0 in | Wt 132.0 lb

## 2024-09-09 DIAGNOSIS — Z1321 Encounter for screening for nutritional disorder: Secondary | ICD-10-CM

## 2024-09-09 DIAGNOSIS — G35D Multiple sclerosis, unspecified: Secondary | ICD-10-CM

## 2024-09-09 DIAGNOSIS — G43009 Migraine without aura, not intractable, without status migrainosus: Secondary | ICD-10-CM | POA: Insufficient documentation

## 2024-09-09 DIAGNOSIS — Z9989 Dependence on other enabling machines and devices: Secondary | ICD-10-CM

## 2024-09-09 DIAGNOSIS — H469 Unspecified optic neuritis: Secondary | ICD-10-CM

## 2024-09-09 DIAGNOSIS — Z9181 History of falling: Secondary | ICD-10-CM

## 2024-09-09 DIAGNOSIS — R252 Cramp and spasm: Secondary | ICD-10-CM

## 2024-09-09 DIAGNOSIS — M21372 Foot drop, left foot: Secondary | ICD-10-CM

## 2024-09-09 DIAGNOSIS — G8929 Other chronic pain: Secondary | ICD-10-CM

## 2024-09-09 DIAGNOSIS — I7300 Raynaud's syndrome without gangrene: Secondary | ICD-10-CM

## 2024-09-09 NOTE — Patient Instructions (Signed)
 To keep you healthy, please keep in mind the following health maintenance items that you are due for:   Health Maintenance Due  Topic Date Due   HPV VACCINES (3 - 3-dose series) 06/06/2008   Cervical Cancer Screening (HPV/Pap Cotest)  Never done   COVID-19 Vaccine (4 - 2025-26 season) 04/04/2024     Best Wishes,   Dr. Lang

## 2024-09-09 NOTE — Progress Notes (Unsigned)
 "  Established Patient Office Visit  Patient ID: Toni Weaver, female    DOB: 1990/12/12  Age: 34 y.o. MRN: [id number] PCP: Sharma Coyer, MD  Chief Complaint  Patient presents with   Establish Care    Patient is present to establish care with new PCP. NO acute concerns other than to establish. Neurology manages muscle relaxers and migraine meds. Just needs pcp to manage antidepressants     Subjective:     HPI  Discussed the use of AI scribe software for clinical note transcription with the patient, who gave verbal consent to proceed.  History of Present Illness Toni Weaver is a 34 year old female with multiple sclerosis who presents to establish care and manage her Lexapro  prescription.  She is currently managed by her neurologist for muscle relaxers and migraine medications. She takes Lexapro  10 mg daily for depression and Wellbutrin  150 mg daily. Additionally, she takes Topranil 25 mg at bedtime for chronic headaches and Zanaflex  2 mg as needed for muscle relaxation.  Her medical history includes multiple sclerosis, spasticity, Raynaud's phenomenon, left foot drop, and optic neuritis. Optic neuritis does not affect her vision unless she is experiencing a flare, which is rare. She uses a cane at work and when out, and sometimes uses the wall for support at home. She lives with her family.  For multiple sclerosis, she was previously on Kesimpta  but stopped taking it about five months ago and plans to switch to a new medication. She has communicated with her neurologist about this change.  She experiences migraines more than two weeks out of the month, often feeling pressure rather than a typical migraine. 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 She takes Maxalt  10 mg for migraines and reports that since starting Topranil, she has not needed to use rescue medication as frequently.  She takes Verapamil  120 mg daily for Raynaud's phenomenon,  noting that her feet are always cold despite medication. She also uses a heating pad and occasionally ibuprofen for knee discomfort, which she describes as feeling locked and uncomfortable rather than painful.  She has not been hospitalized for depression. She is not sexually active.   {History (Optional):23778}  ROS    Objective:     BP 104/70 (BP Location: Left Arm, Patient Position: Sitting, Cuff Size: Normal)   Pulse 89   Temp 97.9 F (36.6 C) (Oral)   Ht 5' 2 (1.575 m)   Wt 132 lb (59.9 kg)   LMP 09/01/2024 (Approximate)   SpO2 100%   BMI 24.14 kg/m  {Vitals History (Optional):23777}  Physical Exam Vitals reviewed.  Constitutional:      General: She is not in acute distress.    Appearance: Normal appearance. She is not ill-appearing.  Cardiovascular:     Rate and Rhythm: Normal rate and regular rhythm.  Pulmonary:     Effort: Pulmonary effort is normal. No respiratory distress.     Breath sounds: No wheezing, rhonchi or rales.  Neurological:     Mental Status: She is alert and oriented to person, place, and time.  Psychiatric:        Mood and Affect: Mood normal.        Behavior: Behavior normal.        No data to display              No data to display           {PhysExam Abridge (Optional):210964309} No results found for any visits  on 09/09/24.  Last CBC Lab Results  Component Value Date   WBC 4.4 02/24/2023   HGB 12.2 02/24/2023   HCT 37.2 02/24/2023   MCV 85 02/24/2023   MCH 27.9 02/24/2023   RDW 13.3 02/24/2023   PLT 374 02/24/2023   Last metabolic panel Lab Results  Component Value Date   GLUCOSE 80 11/07/2021   NA 141 11/07/2021   K 4.0 11/07/2021   CL 102 11/07/2021   CO2 25 11/07/2021   BUN 15 11/07/2021   CREATININE 0.67 11/07/2021   EGFR 120 11/07/2021   CALCIUM 9.3 11/07/2021   PHOS 2.5 03/10/2021   PROT 6.6 11/07/2021   ALBUMIN 4.5 11/07/2021   LABGLOB 2.1 11/07/2021   AGRATIO 2.1 11/07/2021   BILITOT <0.2  11/07/2021   ALKPHOS 40 (L) 11/07/2021   AST 14 11/07/2021   ALT 9 11/07/2021   ANIONGAP 6 03/10/2021   Last lipids No results found for: CHOL, HDL, LDLCALC, LDLDIRECT, TRIG, CHOLHDL Last hemoglobin A1c No results found for: HGBA1C Last thyroid functions Lab Results  Component Value Date   TSH 2.006 03/10/2021   Last vitamin D Lab Results  Component Value Date   VD25OH 27.95 (L) 03/10/2021   Last vitamin B12 and Folate No results found for: VITAMINB12, FOLATE    The ASCVD Risk score (Arnett DK, et al., 2019) failed to calculate for the following reasons:   The 2019 ASCVD risk score is only valid for ages 72 to 38  Outpatient Encounter Medications as of 09/09/2024  Medication Sig   baclofen  (LIORESAL ) 10 MG tablet Take 1-2 tablets (10-20 mg total) by mouth 3 (three) times daily.   buPROPion  (WELLBUTRIN  XL) 150 MG 24 hr tablet Take 1 tablet (150 mg total) by mouth in the morning.   escitalopram  (LEXAPRO ) 10 MG tablet Take 1 tablet (10 mg total) by mouth daily.   imipramine  (TOFRANIL ) 25 MG tablet Take 1 tablet (25 mg total) by mouth at bedtime.   Multiple Vitamin (MULTIVITAMIN) tablet Take 1 tablet by mouth daily.   ondansetron  (ZOFRAN -ODT) 4 MG disintegrating tablet DISSOLVE 1 TABLET(4 MG) ON THE TONGUE EVERY 8 HOURS AS NEEDED FOR NAUSEA OR VOMITING   rizatriptan  (MAXALT ) 10 MG tablet TAKE 1 TABLET BY MOUTH AS NEEDED FOR MIGRAINE. MAY REPEAT IN 2 HOURS IF NEEDED   tiZANidine  (ZANAFLEX ) 2 MG tablet Take 1 tablet (2 mg total) by mouth at bedtime as needed for muscle spasms.   verapamil  (CALAN -SR) 120 MG CR tablet Take 1 tablet (120 mg total) by mouth daily.   [DISCONTINUED] escitalopram  (LEXAPRO ) 10 MG tablet Take 10 mg by mouth daily.   KESIMPTA  20 MG/0.4ML SOAJ Inject 0.4 mLs into the skin every 30 (thirty) days. (Patient not taking: Reported on 09/09/2024)   [DISCONTINUED] buPROPion  (WELLBUTRIN  XL) 150 MG 24 hr tablet Take 150 mg by mouth daily.   [DISCONTINUED]  dalfampridine  10 MG TB12 One po q12 hours   [DISCONTINUED] diclofenac Sodium (VOLTAREN) 1 % GEL Apply 4 g topically 4 (four) times daily as needed. (Patient not taking: Reported on 09/09/2024)   [DISCONTINUED] predniSONE  (DELTASONE ) 50 MG tablet 12 pills (600 mg) po every day x 5 days for MS exacerbation.   No facility-administered encounter medications on file as of 09/09/2024.       Assessment & Plan:   Problem List Items Addressed This Visit     Left foot drop   Migraine without aura and without status migrainosus, not intractable   Multiple sclerosis - Primary (Chronic)  Relevant Orders   TSH + free T4   CBC   CMP14+EGFR   Hemoglobin A1c   Sed Rate (ESR)   Optic neuritis   Raynaud's disease without gangrene   Relevant Orders   CBC   CMP14+EGFR   Spasticity   Relevant Orders   TSH + free T4   CMP14+EGFR   Other Visit Diagnoses       Ambulates with cane         At moderate risk for fall         Encounter for vitamin deficiency screening       Relevant Orders   Vitamin B12   VITAMIN D 25 Hydroxy (Vit-D Deficiency, Fractures)     Chronic pain of left knee       Relevant Orders   TSH + free T4   CMP14+EGFR       Assessment and Plan Assessment & Plan Depressive disorder Well-controlled with current medication regimen. No history of hospitalization or emergency department visits for depression. - Continue Lexapro  10 mg daily and Wellbutrin  150 mg daily.  Multiple sclerosis with spasticity, left foot drop, and optic neuritis Multiple sclerosis managed by neurologist. Spasticity managed with baclofen . Left foot drop and optic neuritis present. Kesimpta  discontinued due to lack of efficacy. Awaiting neurologist appointment for medication switch. - Continue baclofen  20 mg three times a day. - Follow up with neurologist for medication switch.  Migraine Managed with Maxalt  and Topranil. Headaches occur more than two weeks a month, but improved with hydration. Topranil  effective in reducing need for rescue medication. - Continue Maxalt  10 mg as needed for migraines. - Continue Topranil 25 mg at bedtime.  Raynaud's disease Managed with verapamil . Symptoms include cold feet, but no significant change with medication. - Continue verapamil  120 mg daily.  Chronic pain of left knee Chronic left knee pain likely related to prolonged sitting and MS. Pain relieved by elevating legs. No swelling noted. Previous physical therapy with water therapy showed some improvement. - Ordered blood work to check for anemia, metabolic panel, thyroid function, vitamin B12, and vitamin D levels. - Recommended ibuprofen as needed for inflammation and pain, pending lab results.  General health maintenance Pap smear due in 2027. Last Pap smear was negative for intraepithelial lesions and high-risk HPV. No cervical cancer screening needed until 2027. - Scheduled Pap smear for 2027. - Scheduled physical exam in six months.    Return in about 6 months (around 03/09/2025) for CPE.    Rockie Agent, MD [facility]  "

## 2024-09-27 ENCOUNTER — Ambulatory Visit: Payer: Self-pay | Admitting: Neurology

## 2025-03-10 ENCOUNTER — Encounter: Admitting: Family Medicine
